# Patient Record
Sex: Female | Born: 1995 | Race: White | Hispanic: No | Marital: Single | Smoking: Former smoker
Health system: Southern US, Community
[De-identification: ages and names within clinical notes are randomized; demographics above are authoritative.]

## PROBLEM LIST (undated history)

## (undated) DIAGNOSIS — Z8709 Personal history of other diseases of the respiratory system: Secondary | ICD-10-CM

## (undated) DIAGNOSIS — K219 Gastro-esophageal reflux disease without esophagitis: Secondary | ICD-10-CM

## (undated) DIAGNOSIS — D649 Anemia, unspecified: Secondary | ICD-10-CM

## (undated) DIAGNOSIS — K589 Irritable bowel syndrome without diarrhea: Secondary | ICD-10-CM

## (undated) DIAGNOSIS — J45909 Unspecified asthma, uncomplicated: Secondary | ICD-10-CM

## (undated) DIAGNOSIS — E282 Polycystic ovarian syndrome: Secondary | ICD-10-CM

## (undated) DIAGNOSIS — F419 Anxiety disorder, unspecified: Secondary | ICD-10-CM

## (undated) DIAGNOSIS — T8859XA Other complications of anesthesia, initial encounter: Secondary | ICD-10-CM

## (undated) DIAGNOSIS — Z87442 Personal history of urinary calculi: Secondary | ICD-10-CM

## (undated) DIAGNOSIS — T7840XA Allergy, unspecified, initial encounter: Secondary | ICD-10-CM

## (undated) DIAGNOSIS — F329 Major depressive disorder, single episode, unspecified: Secondary | ICD-10-CM

## (undated) DIAGNOSIS — K5090 Crohn's disease, unspecified, without complications: Secondary | ICD-10-CM

## (undated) DIAGNOSIS — Z8489 Family history of other specified conditions: Secondary | ICD-10-CM

## (undated) DIAGNOSIS — F32A Depression, unspecified: Secondary | ICD-10-CM

## (undated) DIAGNOSIS — K649 Unspecified hemorrhoids: Secondary | ICD-10-CM

## (undated) DIAGNOSIS — E119 Type 2 diabetes mellitus without complications: Secondary | ICD-10-CM

## (undated) DIAGNOSIS — G4730 Sleep apnea, unspecified: Secondary | ICD-10-CM

## (undated) DIAGNOSIS — L0591 Pilonidal cyst without abscess: Secondary | ICD-10-CM

## (undated) DIAGNOSIS — Z973 Presence of spectacles and contact lenses: Secondary | ICD-10-CM

## (undated) DIAGNOSIS — N809 Endometriosis, unspecified: Secondary | ICD-10-CM

## (undated) HISTORY — DX: Depression, unspecified: F32.A

## (undated) HISTORY — PX: COLONOSCOPY WITH ESOPHAGOGASTRODUODENOSCOPY (EGD): SHX5779

## (undated) HISTORY — DX: Anxiety disorder, unspecified: F41.9

## (undated) HISTORY — DX: Polycystic ovarian syndrome: E28.2

## (undated) HISTORY — DX: Unspecified asthma, uncomplicated: J45.909

## (undated) HISTORY — DX: Sleep apnea, unspecified: G47.30

## (undated) HISTORY — PX: COLONOSCOPY: SHX174

## (undated) HISTORY — PX: UPPER GASTROINTESTINAL ENDOSCOPY: SHX188

## (undated) HISTORY — DX: Anemia, unspecified: D64.9

## (undated) HISTORY — DX: Allergy, unspecified, initial encounter: T78.40XA

---
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
## 2017-01-15 ENCOUNTER — Other Ambulatory Visit: Payer: Self-pay

## 2017-01-15 ENCOUNTER — Encounter: Payer: Self-pay | Admitting: *Deleted

## 2017-01-15 ENCOUNTER — Emergency Department
Admission: EM | Admit: 2017-01-15 | Discharge: 2017-01-15 | Disposition: A | Discharge status: Home / Self Care | Payer: Self-pay | Source: Home / Self Care | Attending: Family Medicine | Admitting: Family Medicine

## 2017-01-15 DIAGNOSIS — L0591 Pilonidal cyst without abscess: Secondary | ICD-10-CM

## 2017-01-15 HISTORY — DX: Gastro-esophageal reflux disease without esophagitis: K21.9

## 2017-01-15 HISTORY — DX: Irritable bowel syndrome, unspecified: K58.9

## 2017-01-15 MED ORDER — CLINDAMYCIN HCL 300 MG PO CAPS: ORAL_CAPSULE | ORAL | 0 refills | Status: DC

## 2017-01-15 NOTE — ED Provider Notes (Signed)
[name]    CSN: [id number] Arrival date & time: 01/15/17  0856     History   Chief Complaint Chief Complaint  Patient presents with  . Abscess    HPI Marissa Morales is a 21 y.o. female.   Patient complains of approximately one year history of painful area between her buttocks when she sits.  The area improved for several months initially, and then became painful again about 3 months ago.  She denies swelling, redness, drainage, or fever.  She has been applying warm compresses and an OTC hemorrhoid cream containing lidocaine.   The history is provided by the patient.  Abscess  Abscess location: between buttocks. Abscess quality: painful   Abscess quality: not draining, no fluctuance, no induration, no itching, no redness, no warmth and not weeping   Red streaking: no   Duration:  3 months Progression:  Unchanged Pain details:    Quality:  Aching   Severity:  Mild   Duration:  3 months   Timing:  Constant   Progression:  Unchanged Chronicity:  Chronic Context: not skin injury   Relieved by: topical OTC cream. Exacerbated by: sitting. Ineffective treatments:  Warm compresses Associated symptoms: no anorexia, no fatigue, no fever and no nausea   Risk factors: no prior abscess     Past Medical History:  Diagnosis Date  . GERD (gastroesophageal reflux disease)   . Hypoglycemia   . IBS (irritable bowel syndrome)     There are no active problems to display for this patient.   Past Surgical History:  Procedure Laterality Date  . COLONOSCOPY    . UPPER GASTROINTESTINAL ENDOSCOPY    . WISDOM TOOTH EXTRACTION      OB History    No data available       Home Medications    Prior to Admission medications   Medication Sig Start Date End Date Taking? Authorizing Provider  Multiple Vitamin (MULTIVITAMIN) tablet Take 1 tablet by mouth daily.   Yes [provider]  clindamycin (CLEOCIN) 300 MG capsule Take one cap every 8 hours for one week  01/15/17   Kandra Nicolas, MD    Family History Family History  Problem Relation Age of Onset  . Irritable bowel syndrome Mother   . Endometriosis Mother   . Colon cancer Mother   . Hypertension Father   . Endometriosis Sister   . Irritable bowel syndrome Sister   . Crohn's disease Sister   . Breast cancer Paternal Grandfather   . Heart attack Maternal Grandmother     Social History Social History   Tobacco Use  . Smoking status: Current Every Day Smoker    Packs/day: 0.50    Types: Cigarettes  . Smokeless tobacco: Never Used  Substance Use Topics  . Alcohol use: Yes    Comment: 1 q wk  . Drug use: No     Allergies   Bactrim [sulfamethoxazole-trimethoprim]; Doxycycline; and Vantin [cefpodoxime]   Review of Systems Review of Systems  Constitutional: Negative for fatigue and fever.  Gastrointestinal: Negative for anorexia and nausea.  All other systems reviewed and are negative.    Physical Exam Triage Vital Signs ED Triage Vitals  Enc Vitals Group     BP 01/15/17 0917 132/88     Pulse Rate 01/15/17 0917 84     Resp 01/15/17 0917 16     Temp 01/15/17 0917 (!) 97.5 F (36.4 C)     Temp Source 01/15/17 0917 Oral  SpO2 01/15/17 0917 99 %     Weight 01/15/17 0918 121 lb (54.9 kg)     Height 01/15/17 0918 5' (1.524 m)     Head Circumference --      Peak Flow --      Pain Score 01/15/17 0918 4     Pain Loc --      Pain Edu? --      Excl. in [place]? --    No data found.  Updated Vital Signs BP 132/88 (BP Location: Left Arm)   Pulse 84   Temp (!) 97.5 F (36.4 C) (Oral)   Resp 16   Ht 5' (1.524 m)   Wt 121 lb (54.9 kg)   LMP 01/14/2017   SpO2 99%   BMI 23.63 kg/m   Visual Acuity Right Eye Distance:   Left Eye Distance:   Bilateral Distance:    Right Eye Near:   Left Eye Near:    Bilateral Near:     Physical Exam  Constitutional: She appears well-developed and well-nourished. No distress.  HENT:  Head: Normocephalic.  Mouth/Throat:  Oropharynx is clear and moist.  Eyes: Pupils are equal, round, and reactive to light.  Cardiovascular: Normal rate.  Pulmonary/Chest: Effort normal.  Neurological: She is alert.  Skin: Skin is warm and dry.     There is tenderness to deep palpation at superior aspect of intergluteal cleft, but no swelling, induration, fluctuance, erythema, or other skin changes.    Nursing note and vitals reviewed.    UC Treatments / Results  Labs (all labs ordered are listed, but only abnormal results are displayed) Labs Reviewed - No data to display  EKG  EKG Interpretation None       Radiology No results found.  Procedures Procedures (including critical care time)  Medications Ordered in UC Medications - No data to display   Initial Impression / Assessment and Plan / UC Course  I have reviewed the triage vital signs and the nursing notes.  Pertinent labs & imaging results that were available during my care of the patient were reviewed by me and considered in my medical decision making (see chart for details).    No evidence abscess. Begin clindamycin 300mg  TID. Apply heating pad, or sit in warm bath tub once or twice daily. Return if develops swelling, increased pain, redness, etc.    Final Clinical Impressions(s) / UC Diagnoses   Final diagnoses:  Pilonidal cyst without abscess    ED Discharge Orders        Ordered    clindamycin (CLEOCIN) 300 MG capsule     01/15/17 0934           Kandra Nicolas, MD 01/15/17 [phone]

## 2017-01-15 NOTE — Discharge Instructions (Signed)
Apply heating pad, or sit in warm bath tub once or twice daily.

## 2017-01-15 NOTE — ED Triage Notes (Signed)
Pt c/o abscess in the middle of her buttocks x 1 year, worsening pain x 2-3 mths.

## 2017-06-03 ENCOUNTER — Emergency Department (INDEPENDENT_AMBULATORY_CARE_PROVIDER_SITE_OTHER)
Admission: EM | Admit: 2017-06-03 | Discharge: 2017-06-03 | Disposition: A | Discharge status: Home / Self Care | Payer: BLUE CROSS/BLUE SHIELD | Source: Home / Self Care

## 2017-06-03 ENCOUNTER — Encounter: Payer: Self-pay | Admitting: Emergency Medicine

## 2017-06-03 ENCOUNTER — Other Ambulatory Visit: Payer: Self-pay

## 2017-06-03 DIAGNOSIS — L0591 Pilonidal cyst without abscess: Secondary | ICD-10-CM

## 2017-06-03 MED ORDER — CLINDAMYCIN HCL 300 MG PO CAPS: 300.0000 mg | ORAL_CAPSULE | Freq: Four times a day (QID) | ORAL | 0 refills | Status: DC

## 2017-06-03 NOTE — ED Triage Notes (Signed)
Patient reports a cyst at bottom of tailbone that was evaluated one year ago; cleared with antibiotics; has returned and is tender/painful.

## 2017-06-03 NOTE — Discharge Instructions (Signed)
Take clindamycin 300 mg 1 pill 3 times daily.  (Caution: If you develop significant diarrhea please discontinue and speak to the doctor)  This would be best taken care of by surgical excision of the entire lesion.  Recommendation for general surgery will be made.  Return if necessary if problems arise before seeing surgeon.

## 2017-06-03 NOTE — ED Provider Notes (Signed)
[name]    CSN: [id number] Arrival date & time: 06/03/17  1524     History   Chief Complaint No chief complaint on file. Patient is had a pilonidal cyst problem for the last 2 years.  It hurts a lot.  He has been hurting more recent weeks.  Last year when it did this she took a course of antibiotics and things subsided.  She says she cannot take sulfa or doxycycline.  The lesion is not been draining.  Last year she said it felt like it went down in size.  Marissa Morales Marissa Morales is a 22 y.o. female.   Marissa Morales  Past Medical History:  Diagnosis Date  . GERD (gastroesophageal reflux disease)   . Hypoglycemia   . IBS (irritable bowel syndrome)     There are no active problems to display for this patient.   Past Surgical History:  Procedure Laterality Date  . COLONOSCOPY    . UPPER GASTROINTESTINAL ENDOSCOPY    . WISDOM TOOTH EXTRACTION      OB History   None      Home Medications    Prior to Admission medications   Medication Sig Start Date End Date Taking? Authorizing Provider  clindamycin (CLEOCIN) 300 MG capsule Take one cap every 8 hours for one week 01/15/17   Kandra Nicolas, MD  Multiple Vitamin (MULTIVITAMIN) tablet Take 1 tablet by mouth daily.    [provider]    Family History Family History  Problem Relation Age of Onset  . Irritable bowel syndrome Mother   . Endometriosis Mother   . Colon cancer Mother   . Hypertension Father   . Endometriosis Sister   . Irritable bowel syndrome Sister   . Crohn's disease Sister   . Breast cancer Paternal Grandfather   . Heart attack Maternal Grandmother     Social History Social History   Tobacco Use  . Smoking status: Current Every Day Smoker    Packs/day: 0.50    Types: Cigarettes  . Smokeless tobacco: Never Used  Substance Use Topics  . Alcohol use: Yes    Comment: 1 q wk  . Drug use: No     Allergies   Bactrim [sulfamethoxazole-trimethoprim]; Doxycycline; and Vantin  [cefpodoxime]   Review of Systems Review of Systems No other complaints other than the pain at her tailbone area.  Physical Exam Triage Vital Signs ED Triage Vitals  Enc Vitals Group     BP      Pulse      Resp      Temp      Temp src      SpO2      Weight      Height      Head Circumference      Peak Flow      Pain Score      Pain Loc      Pain Edu?      Excl. in [place]?    No data found.  Updated Vital Signs There were no vitals taken for this visit.  Visual Acuity Right Eye Distance:   Left Eye Distance:   Bilateral Distance:    Right Eye Near:   Left Eye Near:    Bilateral Near:     Physical Exam She has a small dimple just to the left of the pilonidal area.  Just below that, in the center of the crack, there is a tiny bit of erythema.  No major fluctuance or  cyst formation.  It is significantly tender to touch.  UC Treatments / Results  Labs (all labs ordered are listed, but only abnormal results are displayed) Labs Reviewed - No data to display  EKG None Radiology No results found.  Procedures Procedures (including critical care time)  Medications Ordered in UC Medications - No data to display   Initial Impression / Assessment and Plan / UC Course  I have reviewed the triage vital signs and the nursing notes.  Pertinent labs & imaging results that were available during my care of the patient were reviewed by me and considered in my medical decision making (see chart for details).     Pilonidal sinus with mild cyst inflammation.  This does not appear to need draining today.  However I think the best answer for her would be to have block excision of the sinus tract.  She is very anxious, and this would best be done in day surgery somewhere.  Final Clinical Impressions(s) / UC Diagnoses   Final diagnoses:  None    ED Discharge Orders    None    Take clindamycin 300 mg 1 pill 3 times daily.  (Caution: If you develop significant diarrhea  please discontinue and speak to the doctor)  This would be best taken care of by surgical excision of the entire lesion.  Recommendation for general surgery will be made.  Return if necessary if problems arise before seeing surgeon.   Controlled Substance Prescriptions [place] Controlled Substance Registry consulted? No   Posey Boyer, MD 06/03/17 [phone]

## 2017-06-05 ENCOUNTER — Ambulatory Visit: Payer: Self-pay | Admitting: Surgery

## 2017-06-05 NOTE — H&P (Signed)
Marissa Morales Documented: 06/05/2017 9:43 AM Location: [facility] Patient #: [id number] DOB: 1995-05-25 Single / Language: Marissa Morales / Race: White Female  History of Present Illness (Marissa Morales A. Kae Heller MD; 06/05/2017 9:56 AM) Patient words: 22 year old woman with history of GERD, irritable bowel syndrome is referred for a two-year history of a pilonidal sinus. Resulted to the emergency room a couple days ago with pain for the preceding couple of weeks. This first occurred about a year ago and she was treated with antibiotics with some improvement. She describes essentially constant discomfort in the area aggravated by sitting. No draining or prior procedures in the area. She was put on clindamycin by Dr. Linna Morales in the ER 2 days ago.  The patient is a 22 year old female.   Past Surgical History Marissa Lorenzo, LPN; 08/17/4763 4:65 AM) Oral Surgery  Diagnostic Studies History Marissa Lorenzo, LPN; 0/35/4656 8:12 AM) Colonoscopy 1-5 years ago Pap Smear 1-5 years ago  Allergies Marissa Lorenzo, LPN; 7/51/7001 7:49 AM) Marissa Morales *CEPHALOSPORINS* unknown Doxycycline Hyclate *TETRACYCLINES* Pepto Bismol *ANTIDIARRHEAL/PROBIOTIC AGENTS* Bactrim *ANTI-INFECTIVE AGENTS - MISC.* Rash.  Medication History Marissa Lorenzo, LPN; 4/49/6759 1:63 AM) Clindamycin HCl (300MG Capsule, Oral) Active. Medications Reconciled  Social History Marissa Lorenzo, LPN; 8/46/6599 3:57 AM) Alcohol use Occasional alcohol use. Caffeine use Carbonated beverages, Coffee, Tea. No drug use Tobacco use Former smoker.  Family History Marissa Lorenzo, LPN; 0/17/7939 0:30 AM) Alcohol Abuse Father, Mother. Anesthetic complications Mother. Arthritis Mother. Cerebrovascular Accident Father. Colon Polyps Father, Mother. Depression Father, Mother, Sister. Hypertension Father. Ischemic Bowel Disease Father, Mother, Sister. Respiratory Condition Mother.  Pregnancy / Birth History Marissa Lorenzo, LPN; 0/92/3300 7:62 AM) Age at menarche 45 years. Contraceptive History Oral contraceptives. Gravida 1 Irregular periods Maternal age 68-20 Para 0  Other Problems Marissa Lorenzo, LPN; 2/63/3354 5:62 AM) Anxiety Disorder Asthma Depression Gastric Ulcer Gastroesophageal Reflux Disease Hemorrhoids     Review of Systems Marissa Billings Dockery LPN; 5/63/8937 3:42 AM) General Not Present- Appetite Loss, Chills, Fatigue, Fever, Night Sweats, Weight Gain and Weight Loss. Skin Present- New Lesions and Non-Healing Wounds. Not Present- Change in Wart/Mole, Dryness, Hives, Jaundice, Rash and Ulcer. HEENT Not Present- Earache, Hearing Loss, Hoarseness, Nose Bleed, Oral Ulcers, Ringing in the Ears, Seasonal Allergies, Sinus Pain, Sore Throat, Visual Disturbances, Wears glasses/contact lenses and Yellow Eyes. Respiratory Not Present- Bloody sputum, Chronic Cough, Difficulty Breathing, Snoring and Wheezing. Breast Not Present- Breast Mass, Breast Pain, Nipple Discharge and Skin Changes. Cardiovascular Not Present- Chest Pain, Difficulty Breathing Lying Down, Leg Cramps, Palpitations, Rapid Heart Rate, Shortness of Breath and Swelling of Extremities. Gastrointestinal Not Present- Abdominal Pain, Bloating, Bloody Stool, Change in Bowel Habits, Chronic diarrhea, Constipation, Difficulty Swallowing, Excessive gas, Gets full quickly at meals, Hemorrhoids, Indigestion, Nausea, Rectal Pain and Vomiting. Female Genitourinary Not Present- Frequency, Nocturia, Painful Urination, Pelvic Pain and Urgency. Musculoskeletal Not Present- Back Pain, Joint Pain, Joint Stiffness, Muscle Pain, Muscle Weakness and Swelling of Extremities. Neurological Not Present- Decreased Memory, Fainting, Headaches, Numbness, Seizures, Tingling, Tremor, Trouble walking and Weakness. Psychiatric Not Present- Anxiety, Bipolar, Change in Sleep Pattern, Depression, Fearful and Frequent crying. Endocrine Not Present- Cold  Intolerance, Excessive Hunger, Hair Changes, Heat Intolerance, Hot flashes and New Diabetes. Hematology Not Present- Blood Thinners, Easy Bruising, Excessive bleeding, Gland problems, HIV and Persistent Infections.  Vitals Marissa Billings Dockery LPN; 8/76/8115 7:26 AM) 06/05/2017 9:44 AM Weight: 129.4 lb Height: 60.5in Body Surface Area: 1.56 m Body Mass Index: 24.86 kg/m  Temp.: 97.80F(Temporal)  Pulse: 61 (Regular)  BP: 118/64 (Sitting,  Left Arm, Standard)      Physical Exam (Marissa Morales A. Kae Heller MD; 06/05/2017 9:58 AM)  The physical exam findings are as follows: Note:Gen: alert and well appearing Eye: extraocular motion intact, no scleral icterus ENT: moist mucus membranes, dentition intact Neck: no mass or thyromegaly Chest: unlabored respirations, symmetrical air entry CV: regular rate and rhythm, no pedal edema Abdomen: soft, nontender, nondistended. MSK: strength symmetrical throughout, no deformity Neuro: grossly intact, normal gait Psych: normal mood and anxious affect, appropriate insight Skin: warm and dry. Just to the right of midline at the superior aspect of the natal cleft there is a less than 1 cm area of pink/purple discolored skin without surrounding erythema or induration, although there is no palpable large cyst or abscess this area is extremely tender. In the midline just inferior to this there is a very subtle pit consistent with a pilonidal sinus.    Assessment & Plan (Marissa Morales A. 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 We discussed options of ongoing observation versus excision and she very much wants to pursue excision. I discussed that this would entail an open wound that would have to heal from the inside out, and that there is a significant risk of chronic wound and difficulty healing in this area. Discussed very small risk of recurrent disease given her absence of hair in the area. She  expressed understanding. her questions were answered to her satisfaction and she desires to proceed.

## 2017-07-15 ENCOUNTER — Encounter (HOSPITAL_BASED_OUTPATIENT_CLINIC_OR_DEPARTMENT_OTHER): Payer: Self-pay | Admitting: *Deleted

## 2017-07-15 ENCOUNTER — Other Ambulatory Visit: Payer: Self-pay

## 2017-07-15 NOTE — Progress Notes (Signed)
SPOKE W/ PT VIA PHONE FOR PRE-OP INTERVIEW.  NPO AFTER MN.  ARRIVE AT 0530.  NEEDS URINE PREG.  WILL DO HIBICLENS SHOWER HS BEFORE AND AM DOS.

## 2017-07-17 NOTE — Anesthesia Preprocedure Evaluation (Addendum)
Anesthesia Evaluation  Patient identified by MRN, date of birth, ID band Patient awake    Reviewed: Allergy & Precautions, NPO status , Patient's Chart, lab work & pertinent test results  Airway Mallampati: II  TM Distance: >3 FB     Dental   Pulmonary former smoker,    breath sounds clear to auscultation       Cardiovascular negative cardio ROS   Rhythm:Regular Rate:Normal     Neuro/Psych    GI/Hepatic Neg liver ROS, GERD  ,  Endo/Other  negative endocrine ROS  Renal/GU negative Renal ROS     Musculoskeletal   Abdominal   Peds  Hematology   Anesthesia Other Findings   Reproductive/Obstetrics                            Anesthesia Physical Anesthesia Plan  ASA: III  Anesthesia Plan: MAC   Post-op Pain Management:    Induction: Intravenous  PONV Risk Score and Plan: Treatment may vary due to age or medical condition, Ondansetron, Dexamethasone and Midazolam  Airway Management Planned: Simple Face Mask  Additional Equipment:   Intra-op Plan:   Post-operative Plan:   Informed Consent: I have reviewed the patients History and Physical, chart, labs and discussed the procedure including the risks, benefits and alternatives for the proposed anesthesia with the patient or authorized representative who has indicated his/her understanding and acceptance.   Dental advisory given  Plan Discussed with: Anesthesiologist and CRNA  Anesthesia Plan Comments:       Anesthesia Quick Evaluation

## 2017-07-18 ENCOUNTER — Ambulatory Visit (HOSPITAL_BASED_OUTPATIENT_CLINIC_OR_DEPARTMENT_OTHER): Payer: BLUE CROSS/BLUE SHIELD | Admitting: Anesthesiology

## 2017-07-18 ENCOUNTER — Ambulatory Visit (HOSPITAL_BASED_OUTPATIENT_CLINIC_OR_DEPARTMENT_OTHER)
Admission: RE | Admit: 2017-07-18 | Discharge: 2017-07-18 | Disposition: A | Discharge status: Home / Self Care | Payer: BLUE CROSS/BLUE SHIELD | Source: Ambulatory Visit | Attending: Surgery | Admitting: Surgery

## 2017-07-18 ENCOUNTER — Encounter (HOSPITAL_BASED_OUTPATIENT_CLINIC_OR_DEPARTMENT_OTHER)
Admission: RE | Disposition: A | Discharge status: Home / Self Care | Payer: Self-pay | Source: Ambulatory Visit | Attending: Surgery

## 2017-07-18 ENCOUNTER — Encounter (HOSPITAL_BASED_OUTPATIENT_CLINIC_OR_DEPARTMENT_OTHER): Payer: Self-pay

## 2017-07-18 DIAGNOSIS — F329 Major depressive disorder, single episode, unspecified: Secondary | ICD-10-CM | POA: Insufficient documentation

## 2017-07-18 DIAGNOSIS — L0591 Pilonidal cyst without abscess: Secondary | ICD-10-CM | POA: Diagnosis present

## 2017-07-18 DIAGNOSIS — K219 Gastro-esophageal reflux disease without esophagitis: Secondary | ICD-10-CM | POA: Diagnosis not present

## 2017-07-18 DIAGNOSIS — F419 Anxiety disorder, unspecified: Secondary | ICD-10-CM | POA: Diagnosis not present

## 2017-07-18 DIAGNOSIS — Z87891 Personal history of nicotine dependence: Secondary | ICD-10-CM | POA: Insufficient documentation

## 2017-07-18 DIAGNOSIS — Z79899 Other long term (current) drug therapy: Secondary | ICD-10-CM | POA: Diagnosis not present

## 2017-07-18 DIAGNOSIS — J45909 Unspecified asthma, uncomplicated: Secondary | ICD-10-CM | POA: Insufficient documentation

## 2017-07-18 HISTORY — DX: Unspecified hemorrhoids: K64.9

## 2017-07-18 HISTORY — DX: Personal history of other diseases of the respiratory system: Z87.09

## 2017-07-18 HISTORY — DX: Presence of spectacles and contact lenses: Z97.3

## 2017-07-18 HISTORY — DX: Pilonidal cyst without abscess: L05.91

## 2017-07-18 HISTORY — PX: PILONIDAL CYST EXCISION: SHX744

## 2017-07-18 LAB — POCT PREGNANCY, URINE: Preg Test, Ur: NEGATIVE

## 2017-07-18 SURGERY — EXCISION, SIMPLE PILONIDAL CYST
Anesthesia: Monitor Anesthesia Care

## 2017-07-18 MED ORDER — FENTANYL CITRATE (PF) 100 MCG/2ML IJ SOLN
INTRAMUSCULAR | Status: AC
  Filled 2017-07-18: qty 2

## 2017-07-18 MED ORDER — BUPIVACAINE-EPINEPHRINE 0.25% -1:200000 IJ SOLN
INTRAMUSCULAR | Status: DC | PRN
  Administered 2017-07-18: 15 mL

## 2017-07-18 MED ORDER — ONDANSETRON HCL 4 MG/2ML IJ SOLN
INTRAMUSCULAR | Status: AC
  Filled 2017-07-18: qty 2

## 2017-07-18 MED ORDER — PROPOFOL 10 MG/ML IV BOLUS
INTRAVENOUS | Status: DC | PRN
  Administered 2017-07-18 (×3): 20 mg via INTRAVENOUS

## 2017-07-18 MED ORDER — HYDROCODONE-ACETAMINOPHEN 5-325 MG PO TABS: 1.0000 | ORAL_TABLET | Freq: Four times a day (QID) | ORAL | 0 refills | Status: DC | PRN

## 2017-07-18 MED ORDER — GABAPENTIN 300 MG PO CAPS
300.0000 mg | ORAL_CAPSULE | ORAL | Status: AC
  Administered 2017-07-18: 300 mg via ORAL
  Filled 2017-07-18: qty 1

## 2017-07-18 MED ORDER — SODIUM CHLORIDE 0.9% FLUSH
3.0000 mL | INTRAVENOUS | Status: DC | PRN
  Filled 2017-07-18: qty 3

## 2017-07-18 MED ORDER — MIDAZOLAM HCL 5 MG/5ML IJ SOLN
INTRAMUSCULAR | Status: DC | PRN
  Administered 2017-07-18: 2 mg via INTRAVENOUS

## 2017-07-18 MED ORDER — FENTANYL CITRATE (PF) 100 MCG/2ML IJ SOLN
25.0000 ug | INTRAMUSCULAR | Status: DC | PRN
  Filled 2017-07-18: qty 1

## 2017-07-18 MED ORDER — PROPOFOL 500 MG/50ML IV EMUL
INTRAVENOUS | Status: DC | PRN
  Administered 2017-07-18: 75 ug/kg/min via INTRAVENOUS

## 2017-07-18 MED ORDER — VANCOMYCIN HCL IN DEXTROSE 1-5 GM/200ML-% IV SOLN
INTRAVENOUS | Status: AC
  Filled 2017-07-18: qty 200

## 2017-07-18 MED ORDER — DIPHENHYDRAMINE HCL 50 MG/ML IJ SOLN
INTRAMUSCULAR | Status: AC
  Filled 2017-07-18: qty 1

## 2017-07-18 MED ORDER — LACTATED RINGERS IV SOLN
INTRAVENOUS | Status: DC
  Administered 2017-07-18: 06:00:00 via INTRAVENOUS
  Filled 2017-07-18: qty 1000

## 2017-07-18 MED ORDER — CHLORHEXIDINE GLUCONATE 4 % EX LIQD
60.0000 mL | Freq: Once | CUTANEOUS | Status: DC
  Filled 2017-07-18: qty 118

## 2017-07-18 MED ORDER — ACETAMINOPHEN 325 MG PO TABS
650.0000 mg | ORAL_TABLET | ORAL | Status: DC | PRN
  Filled 2017-07-18: qty 2

## 2017-07-18 MED ORDER — ACETAMINOPHEN 650 MG RE SUPP
650.0000 mg | RECTAL | Status: DC | PRN
  Filled 2017-07-18: qty 1

## 2017-07-18 MED ORDER — GABAPENTIN 300 MG PO CAPS
ORAL_CAPSULE | ORAL | Status: AC
  Filled 2017-07-18: qty 1

## 2017-07-18 MED ORDER — SODIUM CHLORIDE 0.9 % IV SOLN
250.0000 mL | INTRAVENOUS | Status: DC | PRN
  Filled 2017-07-18: qty 250

## 2017-07-18 MED ORDER — DOCUSATE SODIUM 100 MG PO CAPS: 100.0000 mg | ORAL_CAPSULE | Freq: Two times a day (BID) | ORAL | 0 refills | Status: AC

## 2017-07-18 MED ORDER — DIPHENHYDRAMINE HCL 50 MG/ML IJ SOLN
INTRAMUSCULAR | Status: DC | PRN
  Administered 2017-07-18: 6.25 mg via INTRAVENOUS

## 2017-07-18 MED ORDER — ONDANSETRON HCL 4 MG/2ML IJ SOLN
INTRAMUSCULAR | Status: DC | PRN
  Administered 2017-07-18: 4 mg via INTRAVENOUS

## 2017-07-18 MED ORDER — KETAMINE HCL 10 MG/ML IJ SOLN
INTRAMUSCULAR | Status: AC
  Filled 2017-07-18: qty 1

## 2017-07-18 MED ORDER — PROPOFOL 500 MG/50ML IV EMUL
INTRAVENOUS | Status: AC
  Filled 2017-07-18: qty 50

## 2017-07-18 MED ORDER — MIDAZOLAM HCL 2 MG/2ML IJ SOLN
INTRAMUSCULAR | Status: AC
  Filled 2017-07-18: qty 2

## 2017-07-18 MED ORDER — DEXAMETHASONE SODIUM PHOSPHATE 10 MG/ML IJ SOLN
INTRAMUSCULAR | Status: AC
  Filled 2017-07-18: qty 1

## 2017-07-18 MED ORDER — DIPHENHYDRAMINE HCL 50 MG/ML IJ SOLN
6.2500 mg | Freq: Once | INTRAMUSCULAR | Status: AC
  Administered 2017-07-18: 6.25 mg via INTRAVENOUS
  Filled 2017-07-18: qty 0.13

## 2017-07-18 MED ORDER — SODIUM CHLORIDE 0.9% FLUSH
3.0000 mL | Freq: Two times a day (BID) | INTRAVENOUS | Status: DC
  Filled 2017-07-18: qty 3

## 2017-07-18 MED ORDER — FENTANYL CITRATE (PF) 100 MCG/2ML IJ SOLN
INTRAMUSCULAR | Status: DC | PRN
  Administered 2017-07-18: 50 ug via INTRAVENOUS

## 2017-07-18 MED ORDER — VANCOMYCIN HCL IN DEXTROSE 1-5 GM/200ML-% IV SOLN
1000.0000 mg | INTRAVENOUS | Status: AC
  Administered 2017-07-18: 1000 mg via INTRAVENOUS
  Filled 2017-07-18: qty 200

## 2017-07-18 MED ORDER — DEXAMETHASONE SODIUM PHOSPHATE 10 MG/ML IJ SOLN
INTRAMUSCULAR | Status: DC | PRN
  Administered 2017-07-18: 10 mg via INTRAVENOUS

## 2017-07-18 MED ORDER — ACETAMINOPHEN 500 MG PO TABS
1000.0000 mg | ORAL_TABLET | ORAL | Status: AC
  Administered 2017-07-18: 1000 mg via ORAL
  Filled 2017-07-18: qty 2

## 2017-07-18 MED ORDER — PROPOFOL 10 MG/ML IV BOLUS
INTRAVENOUS | Status: AC
  Filled 2017-07-18: qty 20

## 2017-07-18 MED ORDER — BUPIVACAINE LIPOSOME 1.3 % IJ SUSP
INTRAMUSCULAR | Status: DC | PRN
  Administered 2017-07-18: 15 mL

## 2017-07-18 MED ORDER — ACETAMINOPHEN 500 MG PO TABS
ORAL_TABLET | ORAL | Status: AC
  Filled 2017-07-18: qty 2

## 2017-07-18 MED ORDER — KETAMINE HCL 10 MG/ML IJ SOLN
INTRAMUSCULAR | Status: DC | PRN
  Administered 2017-07-18: 10 mg via INTRAVENOUS
  Administered 2017-07-18: 20 mg via INTRAVENOUS

## 2017-07-18 MED ORDER — OXYCODONE HCL 5 MG PO TABS
5.0000 mg | ORAL_TABLET | ORAL | Status: DC | PRN
  Filled 2017-07-18: qty 2

## 2017-07-18 SURGICAL SUPPLY — 47 items
BENZOIN TINCTURE PRP APPL 2/3 (GAUZE/BANDAGES/DRESSINGS) ×2 IMPLANT
BLADE EXTENDED COATED 6.5IN (ELECTRODE) IMPLANT
BLADE HEX COATED 2.75 (ELECTRODE) ×2 IMPLANT
BLADE SURG 10 STRL SS (BLADE) IMPLANT
BLADE SURG 15 STRL LF DISP TIS (BLADE) ×1 IMPLANT
BLADE SURG 15 STRL SS (BLADE) ×1
BRIEF STRETCH FOR OB PAD LRG (UNDERPADS AND DIAPERS) IMPLANT
CANISTER SUCT 3000ML PPV (MISCELLANEOUS) ×2 IMPLANT
COVER BACK TABLE 60X90IN (DRAPES) ×2 IMPLANT
COVER MAYO STAND STRL (DRAPES) ×2 IMPLANT
DRAIN PENROSE 18X1/4 LTX STRL (WOUND CARE) IMPLANT
DRAPE LAPAROTOMY 100X72 PEDS (DRAPES) ×2 IMPLANT
DRAPE UTILITY XL STRL (DRAPES) ×2 IMPLANT
ELECT BLADE TIP CTD 4 INCH (ELECTRODE) IMPLANT
ELECT NEEDLE BLADE 2-5/6 (NEEDLE) ×2 IMPLANT
ELECT REM PT RETURN 9FT ADLT (ELECTROSURGICAL) ×2
ELECTRODE REM PT RTRN 9FT ADLT (ELECTROSURGICAL) ×1 IMPLANT
GAUZE SPONGE 4X4 12PLY STRL (GAUZE/BANDAGES/DRESSINGS) ×2 IMPLANT
GAUZE SPONGE 4X4 16PLY XRAY LF (GAUZE/BANDAGES/DRESSINGS) IMPLANT
GAUZE SPONGE 4X4 8PLY STR LF (GAUZE/BANDAGES/DRESSINGS) ×2 IMPLANT
GAUZE VASELINE 3X9 (GAUZE/BANDAGES/DRESSINGS) IMPLANT
GLOVE BIO SURGEON STRL SZ 6 (GLOVE) ×2 IMPLANT
GLOVE BIOGEL PI IND STRL 6.5 (GLOVE) ×1 IMPLANT
GLOVE BIOGEL PI INDICATOR 6.5 (GLOVE) ×1
GOWN STRL REUS W/ TWL LRG LVL3 (GOWN DISPOSABLE) ×1 IMPLANT
GOWN STRL REUS W/TWL LRG LVL3 (GOWN DISPOSABLE) ×1
KIT TURNOVER CYSTO (KITS) ×2 IMPLANT
NDL SAFETY ECLIPSE 18X1.5 (NEEDLE) IMPLANT
NEEDLE HYPO 18GX1.5 SHARP (NEEDLE)
NEEDLE HYPO 25X1 1.5 SAFETY (NEEDLE) ×2 IMPLANT
NS IRRIG 500ML POUR BTL (IV SOLUTION) ×2 IMPLANT
PACK BASIN DAY SURGERY FS (CUSTOM PROCEDURE TRAY) ×2 IMPLANT
PAD ABD 8X10 STRL (GAUZE/BANDAGES/DRESSINGS) ×2 IMPLANT
PAD ARMBOARD 7.5X6 YLW CONV (MISCELLANEOUS) ×2 IMPLANT
PENCIL BUTTON HOLSTER BLD 10FT (ELECTRODE) ×2 IMPLANT
SPONGE GAUZE 2X2 8PLY STRL LF (GAUZE/BANDAGES/DRESSINGS) ×2 IMPLANT
SPONGE SURGIFOAM ABS GEL 12-7 (HEMOSTASIS) IMPLANT
SUCTION FRAZIER TIP 10 FR DISP (SUCTIONS) ×2 IMPLANT
SUT CHROMIC 3 0 PS 2 (SUTURE) IMPLANT
SUT CHROMIC 3 0 SH 27 (SUTURE) IMPLANT
SUT VIC AB 2-0 SH 27 (SUTURE)
SUT VIC AB 2-0 SH 27X BRD (SUTURE) IMPLANT
SYR CONTROL 10ML LL (SYRINGE) ×2 IMPLANT
TOWEL OR 17X24 6PK STRL BLUE (TOWEL DISPOSABLE) ×4 IMPLANT
TRAY DSU PREP LF (CUSTOM PROCEDURE TRAY) ×2 IMPLANT
TUBE CONNECTING 12X1/4 (SUCTIONS) ×2 IMPLANT
YANKAUER SUCT BULB TIP NO VENT (SUCTIONS) ×2 IMPLANT

## 2017-07-18 NOTE — Discharge Instructions (Signed)
°  Postoperative Instructions Surgery for Pilonidal Disease Restrictions  You may shower after 24 hours but you should avoid soaking in water for more than ten minutes.  There are no dietary restrictions.  You should avoid alcohol while you are on narcotic pain medication.  Activity can be as tolerated.  Wound care - Your incision was intentionally left open. You should pack the incision with gauze, moistened with saline, covered with a dry dressing and secured with tape. This should be done at least daily or whenever the dressing gets wet. The wound will heal over 6-12 weeks.  - Hair removal using clippers, waxing or depilatory creams around the area will help reduce the chance of recurrence. This can be initiated once the wound starts to heal.    Medications  You will be given a prescription for pain medicine when you are discharged from the hospital. In addition to the prescription pain medicine that you were given, you may take Motrin, Advil, or ibuprofen at the same time. This often gives better pain relief than either one by itself.  Stop the prescription and switch to Motrin, Advil, or ibuprofen as soon as these medications can control your pain. (This will reduce your risk of constipation.) -Most patients will experience some swelling and bruising around the incisions.  Ice packs or heating pads (30-60 minutes up to 6 times a day) will help. Use ice for the first few days to help decrease swelling and bruising, then switch to heat to help relax tight/sore spots and speed recovery.  Some people prefer to use ice alone, heat alone, alternating between ice & heat.  Experiment to what works for you.  Swelling and bruising can take several weeks to resolve.  Take Colace 100 mg two times daily until you are seen in the office for your followup visit.  Call the office if:  The pain worsens and you need to increase the amount of pain medication.  You experience persistent fever or chills.  (You may have lowgrade fevers on and off for the days following surgery? this is your bodys normal reaction to surgery.)  There is redness of more than  inch around the incisions.  There is drainage of cloudy fluid or pus (Drainage of a yellowish bloody fluid is normal.)  You experience persistent nausea or vomiting.   Please call the office [phone]) to make a followup appointment for one to two weeks after surgery and if you have any other problems, questions, or concerns.   The clinic staff is available to answer your questions during regular business hours (8:30am-5pm).  Please dont hesitate to call and ask to speak to one of our nurses for clinical concerns.              If you have disability or family leave forms, bring them to the office for processing. Do not give them to your doctor.  If you have a medical emergency, go to the nearest emergency room or call 911.  A surgeon from [facility] is always on call at the [facility], [place], [place], [place], [place]  [address] ? MAIN: [phone] ? TOLL FREE: [phone] ?  FAX [phone] [email or web address]

## 2017-07-18 NOTE — H&P (Signed)
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 Resulted to the emergency room a couple days ago with pain for the preceding couple of weeks. This first occurred about a year ago and she was treated with antibiotics with some improvement. She describes essentially constant discomfort in the area aggravated by sitting. No draining or prior procedures in the area. She was put on clindamycin by Dr. Linna Darner in the ER 2 days ago.   Past Surgical History  Oral Surgery  Diagnostic Studies History Colonoscopy 1-5 years ago Pap Smear 1-5 years ago  Allergies  Vantin *CEPHALOSPORINS* unknown Doxycycline Hyclate *TETRACYCLINES* Pepto Bismol *ANTIDIARRHEAL/PROBIOTIC AGENTS* Bactrim *ANTI-INFECTIVE AGENTS - MISC.* Rash.  Medication History  Clindamycin HCl (300MG Capsule, Oral) Active. Medications Reconciled  Social History Alcohol use Occasional alcohol use. Caffeine use Carbonated beverages, Coffee, Tea. No drug use Tobacco use Former smoker.  Family History Alcohol Abuse Father, Mother. Anesthetic complications Mother. Arthritis Mother. Cerebrovascular Accident Father. Colon Polyps Father, Mother. Depression Father, Mother, Sister. Hypertension Father. Ischemic Bowel Disease Father, Mother, Sister. Respiratory Condition Mother.  Pregnancy / Birth History Age at menarche 15 years. Contraceptive History Oral contraceptives. Gravida 1 Irregular periods Maternal age 76-20 Para 0  Other Problems  Anxiety Disorder Asthma Depression Gastric Ulcer Gastroesophageal Reflux Disease Hemorrhoids     Review of Systems General Not Present- Appetite Loss, Chills, Fatigue, Fever, Night Sweats, Weight Gain and Weight Loss. Skin Present-  New Lesions and Non-Healing Wounds. Not Present- Change in Wart/Mole, Dryness, Hives, Jaundice, Rash and Ulcer. HEENT Not Present- Earache, Hearing Loss, Hoarseness, Nose Bleed, Oral Ulcers, Ringing in the Ears, Seasonal Allergies, Sinus Pain, Sore Throat, Visual Disturbances, Wears glasses/contact lenses and Yellow Eyes. Respiratory Not Present- Bloody sputum, Chronic Cough, Difficulty Breathing, Snoring and Wheezing. Breast Not Present- Breast Mass, Breast Pain, Nipple Discharge and Skin Changes. Cardiovascular Not Present- Chest Pain, Difficulty Breathing Lying Down, Leg Cramps, Palpitations, Rapid Heart Rate, Shortness of Breath and Swelling of Extremities. Gastrointestinal Not Present- Abdominal Pain, Bloating, Bloody Stool, Change in Bowel Habits, Chronic diarrhea, Constipation, Difficulty Swallowing, Excessive gas, Gets full quickly at meals, Hemorrhoids, Indigestion, Nausea, Rectal Pain and Vomiting. Female Genitourinary Not Present- Frequency, Nocturia, Painful Urination, Pelvic Pain and Urgency. Musculoskeletal Not Present- Back Pain, Joint Pain, Joint Stiffness, Muscle Pain, Muscle Weakness and Swelling of Extremities. Neurological Not Present- Decreased Memory, Fainting, Headaches, Numbness, Seizures, Tingling, Tremor, Trouble walking and Weakness. Psychiatric Not Present- Anxiety, Bipolar, Change in Sleep Pattern, Depression, Fearful and Frequent crying. Endocrine Not Present- Cold Intolerance, Excessive Hunger, Hair Changes, Heat Intolerance, Hot flashes and New Diabetes. Hematology Not Present- Blood Thinners, Easy Bruising, Excessive bleeding, Gland problems, HIV and Persistent Infections.  Vitals:   07/18/17 0547  BP: 130/88  Pulse: (!) 130  Resp: 16  Temp: 97.6 F (36.4 C)  SpO2: 99%     Physical Exam   The physical exam findings are as follows: Note:Gen: alert and well appearing Eye: extraocular motion intact, no scleral icterus ENT: moist mucus membranes,  dentition intact Neck: no mass or thyromegaly Chest: unlabored respirations, symmetrical air entry CV: regular rate and rhythm, no pedal edema Abdomen: soft, nontender, nondistended. MSK: strength symmetrical throughout, no deformity Neuro: grossly intact, normal gait Psych: normal mood and anxious affect, appropriate insight Skin: warm and dry. Just to the right of midline at the superior aspect  of the natal cleft there is a less than 1 cm area of pink/purple discolored skin without surrounding erythema or induration, although there is no palpable large cyst or abscess this area is extremely tender. In the midline just inferior to this there is a very subtle pit consistent with a pilonidal sinus.    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 We discussed options of ongoing observation versus excision and she very much wants to pursue excision. I discussed that this would entail an open wound that would have to heal from the inside out, and that there is a significant risk of chronic wound and difficulty healing in this area. Discussed very small risk of recurrent disease given her absence of hair in the area. She expressed understanding. her questions were answered to her satisfaction and she desires to proceed.  This morning in preop she has developed severe scalp pruritis and flushing with initiation of the vancomycin. Will add this to her allergy list.

## 2017-07-18 NOTE — Transfer of Care (Signed)
Immediate Anesthesia Transfer of Care Note  Patient: Brieana Shimmin  Procedure(s) Performed: CYST EXCISION PILONIDAL (N/A )  Patient Location: PACU  Anesthesia Type:MAC  Level of Consciousness: awake, alert  and oriented  Airway & Oxygen Therapy: Patient Spontanous Breathing and Patient connected to face mask oxygen  Post-op Assessment: Report given to RN and Post -op Vital signs reviewed and stable  Post vital signs: Reviewed and stable  Last Vitals:  Vitals Value Taken Time  BP    Temp    Pulse    Resp    SpO2      Last Pain:  Vitals:   07/18/17 0547  TempSrc: Oral  PainSc: 2       Patients Stated Pain Goal: 5 (77/93/96 8864)  Complications: No apparent anesthesia complications

## 2017-07-18 NOTE — Anesthesia Postprocedure Evaluation (Signed)
Anesthesia Post Note  Patient: Marissa Morales  Procedure(s) Performed: CYST EXCISION PILONIDAL (N/A )     Patient location during evaluation: PACU Anesthesia Type: MAC Pain management: pain level controlled Respiratory status: spontaneous breathing Cardiovascular status: stable Anesthetic complications: no    Last Vitals:  Vitals:   07/18/17 0804 07/18/17 0815  BP: 109/76 131/83  Pulse: 76 (!) 110  Resp: 16 (!) 22  Temp: 36.8 C   SpO2: 100% 96%    Last Pain:  Vitals:   07/18/17 0815  TempSrc:   PainSc: 0-No pain                 Graylon Amory

## 2017-07-18 NOTE — Op Note (Signed)
Operative Note  Marissa Morales  [id number]  [id number]  07/18/2017   Surgeon: Vikki Ports A ConnorMD  Assistant: none  Procedure performed: excision pilonidal cyst  Preop diagnosis: pilonidal cyst Post-op diagnosis/intraop findings: same  Specimens: no Retained items: wet to dry packing will be changed by patient at home in 24-48h EBL: minimal cc Complications: none  Description of procedure: After obtaining informed consent the patient was taken to the operating room and placed prone on operating room table [facility] was initiated, preoperative antibiotics were administered, SCDs applied, and a formal timeout was performed. The natal cleft and surrounding area was prepped and draped in the usual sterile fashion. The skin and subcutaneous tissue surrounding the cyst were infiltrated with a lidocaine/exparel mixture. A small lacrimal duct probe was inserted into the very small midline pit and this was confirmed to communicate with the painful nodule just to the right of midline. An elliptical incision was made over the cyst just off midline and cautery was used to excise the cyst wall and sinus tract. Unfortunately this does extend to the midline and some midline skin was excised. The specimen contained chronic granulation tissue but no large hair nest was present. The wound was debrided with a ray-tec and no remaining cyst wall or granulation was present. Hemostasis was ensured with cautery. The wound was then packed with a saline-moistened 2x2 and covered with a dry dressing. The patient was then awakened, returned to the supine position and taken to PACU in stable condition.   All counts were correct at the completion of the case.

## 2017-07-19 ENCOUNTER — Telehealth: Payer: Self-pay | Admitting: Surgery

## 2017-07-19 NOTE — Telephone Encounter (Signed)
Marissa Morales  10/12/1995 [id number]  Patient Care Team: Patient, No Pcp Per as PCP - General (General Practice)  This patient is a 22 y.o.female who calls today for surgical evaluation.   Date of procedure/visit: 07/18/2017  Surgeon: Victorino Sparrow ConnorMD  Assistant: none  Procedure performed: excision pilonidal cyst  Preop diagnosis: pilonidal cyst Post-op diagnosis/intraop findings: same     Reason for call: Uncontrolled pain.  Patient status post excision of pilonidal disease with marsupialization open wound.  Patient noted she has had poor pain control with ice pack and taking 1 Vicodin every 6 hours.  She is not nauseated or throwing up.  I recommend that she take 2 hydrocodone at a time.  I recommend she take ibuprofen 800 mg every 6 hours.  Continue ice pack.  See if that can better control things.  She is worried about running out of pain medication, but that should be able to help things through the weekend.  I recommend she call Monday morning to see if she needs a refill which is highly likely she expressed appreciation  There are no active problems to display for this patient.   Past Medical History:  Diagnosis Date  . GERD (gastroesophageal reflux disease)   . Hemorrhoids   . History of asthma    childhood  . IBS (irritable bowel syndrome)   . Pilonidal cyst   . Wears glasses     Past Surgical History:  Procedure Laterality Date  . COLONOSCOPY WITH ESOPHAGOGASTRODUODENOSCOPY (EGD)  x2  last one 2015  . WISDOM TOOTH EXTRACTION  2015    Social History   Socioeconomic History  . Marital status: Single    Spouse name: Not on file  . Number of children: Not on file  . Years of education: Not on file  . Highest education level: Not on file  Occupational History  . Not on file  Social Needs  . Financial resource strain: Not on file  . Food insecurity:    Worry: Not on file    Inability: Not on file  . Transportation needs:    Medical: Not on file     Non-medical: Not on file  Tobacco Use  . Smoking status: Former Smoker    Packs/day: 0.50    Years: 5.00    Pack years: 2.50    Types: Cigarettes    Last attempt to quit: 05/15/2017    Years since quitting: 0.1  . Smokeless tobacco: Never Used  . Tobacco comment: 07-15-2017  per pt quit smoking cig. 05-15-2017 but occasionally vapes  Substance and Sexual Activity  . Alcohol use: Yes    Comment: occasional  . Drug use: No  . Sexual activity: Not on file  Lifestyle  . Physical activity:    Days per week: Not on file    Minutes per session: Not on file  . Stress: Not on file  Relationships  . Social connections:    Talks on phone: Not on file    Gets together: Not on file    Attends religious service: Not on file    Active member of club or organization: Not on file    Attends meetings of clubs or organizations: Not on file    Relationship status: Not on file  . Intimate partner violence:    Fear of current or ex partner: Not on file    Emotionally abused: Not on file    Physically abused: Not on file    Forced sexual activity: Not on  file  Other Topics Concern  . Not on file  Social History Narrative  . Not on file    Family History  Problem Relation Age of Onset  . Irritable bowel syndrome Mother   . Endometriosis Mother   . Colon cancer Mother   . Hypertension Father   . Endometriosis Sister   . Irritable bowel syndrome Sister   . Crohn's disease Sister   . Breast cancer Paternal Grandfather   . Heart attack Maternal Grandmother     Current Outpatient Medications  Medication Sig Dispense Refill  . acetaminophen (TYLENOL) 500 MG tablet Take 500 mg by mouth every 6 (six) hours as needed.    . calcium carbonate (TUMS - DOSED IN MG ELEMENTAL CALCIUM) 500 MG chewable tablet Chew 1 tablet by mouth as needed for indigestion or heartburn.    . docusate sodium (COLACE) 100 MG capsule Take 1 capsule (100 mg total) by mouth 2 (two) times daily. Ok to stop or decrease  dose if having diarrhea 60 capsule 0  . HYDROcodone-acetaminophen (NORCO/VICODIN) 5-325 MG tablet Take 1 tablet by mouth every 6 (six) hours as needed for moderate pain. 20 tablet 0  . Multiple Vitamin (MULTIVITAMIN) tablet Take 1 tablet by mouth daily.     No current facility-administered medications for this visit.      Allergies  Allergen Reactions  . Bactrim [Sulfamethoxazole-Trimethoprim] Nausea And Vomiting  . Doxycycline Nausea And Vomiting  . Vancomycin Itching    Severe pruritus of scalp, flushing  . Vantin [Cefpodoxime] Other (See Comments)    "unknown childhood reaction"    @VS @  No results found.  Note: This dictation was prepared with Dragon/digital dictation along with [employer]. Any transcriptional errors that result from this process are unintentional.   .Adin Hector, M.D., F.A.C.S. Gastrointestinal and Minimally Invasive Surgery [facility] [address] [place], [place] [address] [phone] Main / Paging  07/19/2017 1:38 PM

## 2017-07-21 ENCOUNTER — Encounter (HOSPITAL_BASED_OUTPATIENT_CLINIC_OR_DEPARTMENT_OTHER): Payer: Self-pay | Admitting: Surgery

## 2019-04-06 ENCOUNTER — Encounter: Payer: Self-pay | Admitting: Gastroenterology

## 2019-05-04 ENCOUNTER — Encounter: Payer: Self-pay | Admitting: Gastroenterology

## 2019-05-04 ENCOUNTER — Ambulatory Visit (INDEPENDENT_AMBULATORY_CARE_PROVIDER_SITE_OTHER): Payer: BC Managed Care – PPO | Admitting: Gastroenterology

## 2019-05-04 ENCOUNTER — Other Ambulatory Visit (INDEPENDENT_AMBULATORY_CARE_PROVIDER_SITE_OTHER): Payer: BC Managed Care – PPO

## 2019-05-04 VITALS — BP 120/70 | HR 68 | Temp 97.7°F | Ht 60.75 in | Wt 142.4 lb

## 2019-05-04 DIAGNOSIS — Z01818 Encounter for other preprocedural examination: Secondary | ICD-10-CM | POA: Diagnosis not present

## 2019-05-04 DIAGNOSIS — K921 Melena: Secondary | ICD-10-CM

## 2019-05-04 DIAGNOSIS — R1084 Generalized abdominal pain: Secondary | ICD-10-CM

## 2019-05-04 LAB — COMPREHENSIVE METABOLIC PANEL
ALT: 9 U/L (ref 0–35)
AST: 12 U/L (ref 0–37)
Albumin: 4.3 g/dL (ref 3.5–5.2)
Alkaline Phosphatase: 98 U/L (ref 39–117)
BUN: 11 mg/dL (ref 6–23)
CO2: 25 mEq/L (ref 19–32)
Calcium: 9.2 mg/dL (ref 8.4–10.5)
Chloride: 103 mEq/L (ref 96–112)
Creatinine, Ser: 0.44 mg/dL (ref 0.40–1.20)
GFR: 175.3 mL/min (ref >60.00)
Glucose, Bld: 96 mg/dL (ref 70–99)
Potassium: 4 mEq/L (ref 3.5–5.1)
Sodium: 135 mEq/L (ref 135–145)
Total Bilirubin: 0.3 mg/dL (ref 0.2–1.2)
Total Protein: 7.4 g/dL (ref 6.0–8.3)

## 2019-05-04 LAB — IBC + FERRITIN
Ferritin: 4 ng/mL — ABNORMAL LOW (ref 10.0–291.0)
Iron: 21 ug/dL — ABNORMAL LOW (ref 42–145)
Saturation Ratios: 4.5 % — ABNORMAL LOW (ref 20.0–50.0)
Transferrin: 335 mg/dL (ref 212.0–360.0)

## 2019-05-04 LAB — CBC
HCT: 35.6 % — ABNORMAL LOW (ref 36.0–46.0)
Hemoglobin: 11.5 g/dL — ABNORMAL LOW (ref 12.0–15.0)
MCHC: 32.1 g/dL (ref 30.0–36.0)
MCV: 77.8 fl — ABNORMAL LOW (ref 78.0–100.0)
Platelets: 390 10*3/uL (ref 150.0–400.0)
RBC: 4.58 Mil/uL (ref 3.87–5.11)
RDW: 16 % — ABNORMAL HIGH (ref 11.5–15.5)
WBC: 8 10*3/uL (ref 4.0–10.5)

## 2019-05-04 MED ORDER — NA SULFATE-K SULFATE-MG SULF 17.5-3.13-1.6 GM/177ML PO SOLN: 1.0000 | Freq: Once | ORAL | 0 refills | Status: AC

## 2019-05-04 NOTE — Patient Instructions (Addendum)
If you are age 25 or older, your body mass index should be between 23-30. Your Body mass index is 27.13 kg/m. If this is out of the aforementioned range listed, please consider follow up with your Primary Care Provider.  If you are age 48 or younger, your body mass index should be between 19-25. Your Body mass index is 27.13 kg/m. If this is out of the aformentioned range listed, please consider follow up with your Primary Care Provider.   You have been scheduled for a colonoscopy. Please follow written instructions given to you at your visit today.  Please pick up your prep supplies at the pharmacy within the next 1-3 days. If you use inhalers (even only as needed), please bring them with you on the day of your procedure.   It was a pleasure to see you today!  Dr. Loletha Carrow
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
## 2019-05-11 ENCOUNTER — Ambulatory Visit (INDEPENDENT_AMBULATORY_CARE_PROVIDER_SITE_OTHER): Payer: BC Managed Care – PPO

## 2019-05-11 ENCOUNTER — Other Ambulatory Visit: Payer: Self-pay

## 2019-05-11 ENCOUNTER — Other Ambulatory Visit: Payer: Self-pay | Admitting: Gastroenterology

## 2019-05-11 DIAGNOSIS — Z1159 Encounter for screening for other viral diseases: Secondary | ICD-10-CM

## 2019-05-11 LAB — SARS CORONAVIRUS 2 (TAT 6-24 HRS): SARS Coronavirus 2: NEGATIVE

## 2019-05-12 ENCOUNTER — Encounter: Payer: Self-pay | Admitting: Gastroenterology

## 2019-05-13 ENCOUNTER — Other Ambulatory Visit: Payer: Self-pay

## 2019-05-13 ENCOUNTER — Encounter: Payer: Self-pay | Admitting: Gastroenterology

## 2019-05-13 ENCOUNTER — Ambulatory Visit (AMBULATORY_SURGERY_CENTER): Payer: BC Managed Care – PPO | Admitting: Gastroenterology

## 2019-05-13 ENCOUNTER — Telehealth: Payer: Self-pay | Admitting: Gastroenterology

## 2019-05-13 VITALS — BP 117/73 | HR 71 | Temp 97.1°F | Resp 16

## 2019-05-13 DIAGNOSIS — R1084 Generalized abdominal pain: Secondary | ICD-10-CM

## 2019-05-13 DIAGNOSIS — D120 Benign neoplasm of cecum: Secondary | ICD-10-CM

## 2019-05-13 DIAGNOSIS — K51211 Ulcerative (chronic) proctitis with rectal bleeding: Secondary | ICD-10-CM | POA: Diagnosis not present

## 2019-05-13 DIAGNOSIS — K921 Melena: Secondary | ICD-10-CM

## 2019-05-13 DIAGNOSIS — K625 Hemorrhage of anus and rectum: Secondary | ICD-10-CM

## 2019-05-13 DIAGNOSIS — K635 Polyp of colon: Secondary | ICD-10-CM

## 2019-05-13 DIAGNOSIS — K6289 Other specified diseases of anus and rectum: Secondary | ICD-10-CM

## 2019-05-13 MED ORDER — MESALAMINE 1000 MG RE SUPP: 1000.0000 mg | Freq: Every day | RECTAL | 2 refills | Status: DC

## 2019-05-13 MED ORDER — SODIUM CHLORIDE 0.9 % IV SOLN: 500.0000 mL | Freq: Once | INTRAVENOUS | Status: DC

## 2019-05-13 MED ORDER — HYDROCORTISONE ACETATE 25 MG RE SUPP: 25.0000 mg | Freq: Two times a day (BID) | RECTAL | 0 refills | Status: DC

## 2019-05-13 NOTE — Progress Notes (Signed)
Vitals-CW Temp-LC  History reviewed. CBG 78. Dr does not want pt to have D5W.

## 2019-05-13 NOTE — Op Note (Signed)
[facility] Patient Name: Marissa Morales Procedure Date: 05/13/2019 3:18 PM MRN: [id number] Endoscopist: Mallie Mussel L. Loletha Carrow , MD Age: 24 Referring MD:  Date of Birth: August 26, 1995 Gender: Female Account #: [id number] Procedure:                Colonoscopy Indications:              Rectal bleeding (see recent office note for                            details. Patient also reports her mother was                            disgnosed with colon cancer in her 59's) Medicines:                Monitored Anesthesia Care Procedure:                Pre-Anesthesia Assessment:                           - Prior to the procedure, a History and Physical                            was performed, and patient medications and                            allergies were reviewed. The patient's tolerance of                            previous anesthesia was also reviewed. The risks                            and benefits of the procedure and the sedation                            options and risks were discussed with the patient.                            All questions were answered, and informed consent                            was obtained. Prior Anticoagulants: The patient has                            taken no previous anticoagulant or antiplatelet                            agents. ASA Grade Assessment: II - A patient with                            mild systemic disease. After reviewing the risks                            and benefits, the patient was deemed in  satisfactory condition to undergo the procedure.                           After obtaining informed consent, the colonoscope                            was passed under direct vision. Throughout the                            procedure, the patient's blood pressure, pulse, and                            oxygen saturations were monitored continuously. The                            Colonoscope was introduced  through the anus and                            advanced to the the terminal ileum, with                            identification of the appendiceal orifice and IC                            valve. The colonoscopy was performed without                            difficulty. The patient tolerated the procedure                            well. The quality of the bowel preparation was                            good. The terminal ileum, ileocecal valve,                            appendiceal orifice, and rectum were photographed. Scope In: 3:26:08 PM Scope Out: 3:50:55 PM Scope Withdrawal Time: 0 hours 21 minutes 7 seconds  Total Procedure Duration: 0 hours 24 minutes 47 seconds  Findings:                 The perianal and digital rectal examinations were                            normal.                           The terminal ileum appeared normal.                           A 15 mm polyp was found in the cecum. The polyp was                            flat with a mucus cap. The polyp was removed with a  piecemeal technique using a cold snare (and one                            remaining edge piece removed with cold biopsy                            forceps). Resection and retrieval were complete.                           Inflammation was found in a continuous and                            circumferential pattern in the rectum. This was                            graded as Mayo Score 3 (severe, with spontaneous                            bleeding, ulcerations), and when compared to the                            previous examination, the findings are new.                            Biopsies were taken with a cold forceps for                            histology.                           The exam was otherwise without abnormality. Complications:            No immediate complications. Estimated Blood Loss:     Estimated blood loss was minimal. Impression:                - The examined portion of the ileum was normal.                           - One 15 mm polyp in the cecum, removed piecemeal                            using a cold snare. Resected and retrieved.                           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 Biopsied.                           - The examination was otherwise normal. Recommendation:           - Patient has a contact number available for  emergencies. The signs and symptoms of potential                            delayed complications were discussed with the                            patient. Return to normal activities tomorrow.                            Written discharge instructions were provided to the                            patient.                           - Resume previous diet.                           - Continue present medications.                           - Await pathology results. Clinic follow up will be                            arrange after that.                           - Repeat colonoscopy is recommended for                            surveillance. The colonoscopy date will be                            determined after pathology results from today's                            exam become available for review.                           - Use hydrocortisone suppository 25 mg, 1 per                            rectum twice a day for 10 days. Disp#20, RF zero                           - After that 10 days course, start Canasa 1000 mg                            suppository 1 per rectum QHS. Disp#30, RF 2 Shaquella Stamant L. Loletha Carrow, MD 05/13/2019 4:08:09 PM This report has been signed electronically.

## 2019-05-13 NOTE — Patient Instructions (Addendum)
Start taking Hydrocortisone suppository 25 mg, 1 per rectum twice a day for 10 days.    After that 10 day course, start Canasa 1000 mg suppository 1 per rectum at bedtime.   Handouts Provided:  Polyps  YOU HAD AN ENDOSCOPIC PROCEDURE TODAY AT [facility]:   Refer to the procedure report that was given to you for any specific questions about what was found during the examination.  If the procedure report does not answer your questions, please call your gastroenterologist to clarify.  If you requested that your care partner not be given the details of your procedure findings, then the procedure report has been included in a sealed envelope for you to review at your convenience later.  YOU SHOULD EXPECT: Some feelings of bloating in the abdomen. Passage of more gas than usual.  Walking can help get rid of the air that was put into your GI tract during the procedure and reduce the bloating. If you had a lower endoscopy (such as a colonoscopy or flexible sigmoidoscopy) you may notice spotting of blood in your stool or on the toilet paper. If you underwent a bowel prep for your procedure, you may not have a normal bowel movement for a few days.  Please Note:  You might notice some irritation and congestion in your nose or some drainage.  This is from the oxygen used during your procedure.  There is no need for concern and it should clear up in a day or so.  SYMPTOMS TO REPORT IMMEDIATELY:   Following lower endoscopy (colonoscopy or flexible sigmoidoscopy):  Excessive amounts of blood in the stool  Significant tenderness or worsening of abdominal pains  Swelling of the abdomen that is new, acute  Fever of 100F or higher  For urgent or emergent issues, a gastroenterologist can be reached at any hour by calling [phone]. Do not use MyChart messaging for urgent concerns.    DIET:  We do recommend a small meal at first, but then you may proceed to your regular diet.  Drink  plenty of fluids but you should avoid alcoholic beverages for 24 hours.  ACTIVITY:  You should plan to take it easy for the rest of today and you should NOT DRIVE or use heavy machinery until tomorrow (because of the sedation medicines used during the test).    FOLLOW UP: Our staff will call the number listed on your records 48-72 hours following your procedure to check on you and address any questions or concerns that you may have regarding the information given to you following your procedure. If we do not reach you, we will leave a message.  We will attempt to reach you two times.  During this call, we will ask if you have developed any symptoms of COVID 19. If you develop any symptoms (ie: fever, flu-like symptoms, shortness of breath, cough etc.) before then, please call [phone].  If you test positive for Covid 19 in the 2 weeks post procedure, please call and report this information to [place].    If any biopsies were taken you will be contacted by phone or by letter within the next 1-3 weeks.  Please call us at [phone] if you have not heard about the biopsies in 3 weeks.    SIGNATURES/CONFIDENTIALITY: You and/or your care partner have signed paperwork which will be entered into your electronic medical record.  These signatures attest to the fact that that the information above on your After Visit Summary has  been reviewed and is understood.  Full responsibility of the confidentiality of this discharge information lies with you and/or your care-partner.

## 2019-05-13 NOTE — Telephone Encounter (Signed)
Returned pt's call.  Spoke w/ pt.  Pt stated that she drank her last prep at 10:30 am this morning.  Pt described stool as being mostly clear liquid, light brown in color with visible blood.  Pt denied having any solid stools.    Consulted w/ charge nurse Shelia B. regarding pt.  Advised pt per charge nurse to force fluids up until cut-off time of 12:30 pm.

## 2019-05-13 NOTE — Progress Notes (Signed)
pt tolerated well. VSS. awake and to recovery. Report given to RN.  

## 2019-05-17 ENCOUNTER — Telehealth: Payer: Self-pay

## 2019-05-17 NOTE — Telephone Encounter (Signed)
Left message on answering machine. 

## 2019-05-23 ENCOUNTER — Encounter: Payer: Self-pay | Admitting: Gastroenterology

## 2019-05-27 ENCOUNTER — Telehealth: Payer: Self-pay | Admitting: Gastroenterology

## 2019-05-27 NOTE — Telephone Encounter (Signed)
She can use the hydrocortisone suppositories if she would prefer.  They can be used for a maximum of 14 days.  Have her hold off on picking up the mesalamine suppository for now.  The pharmacy can keep it on file in case she needs it for persistent symptoms later.

## 2019-05-27 NOTE — Telephone Encounter (Signed)
Patient has completed 10 days of the hydrocortisone, do I call in an additional 4 days supply or a new 14 days supply? She has already picked up the [place].

## 2019-05-27 NOTE — Telephone Encounter (Signed)
Please advise 

## 2019-05-27 NOTE — Telephone Encounter (Signed)
Pt inquired whether she can take hydrocortisone and mesalamine suppositories at the same time.  She stated that the hydrocortisone helped her more.

## 2019-05-28 ENCOUNTER — Other Ambulatory Visit: Payer: Self-pay

## 2019-05-28 MED ORDER — HYDROCORTISONE ACETATE 25 MG RE SUPP: 25.0000 mg | Freq: Two times a day (BID) | RECTAL | 0 refills | Status: DC

## 2019-05-28 NOTE — Telephone Encounter (Signed)
My apologies for the confusion.  Yes, 4 more days of hydrocortisone suppository and then begin canasa

## 2019-05-28 NOTE — Telephone Encounter (Signed)
Patient has been notified and aware. Refill for Anusol BID has been refilled.

## 2019-06-15 ENCOUNTER — Telehealth (INDEPENDENT_AMBULATORY_CARE_PROVIDER_SITE_OTHER): Payer: BC Managed Care – PPO | Admitting: Gastroenterology

## 2019-06-15 ENCOUNTER — Encounter: Payer: Self-pay | Admitting: Gastroenterology

## 2019-06-15 VITALS — Ht 60.0 in | Wt 140.0 lb

## 2019-06-15 DIAGNOSIS — K635 Polyp of colon: Secondary | ICD-10-CM | POA: Diagnosis not present

## 2019-06-15 DIAGNOSIS — Z712 Person consulting for explanation of examination or test findings: Secondary | ICD-10-CM

## 2019-06-15 DIAGNOSIS — K529 Noninfective gastroenteritis and colitis, unspecified: Secondary | ICD-10-CM

## 2019-06-15 DIAGNOSIS — K51211 Ulcerative (chronic) proctitis with rectal bleeding: Secondary | ICD-10-CM

## 2019-06-15 MED ORDER — MESALAMINE 800 MG PO TBEC: 1600.0000 mg | DELAYED_RELEASE_TABLET | Freq: Two times a day (BID) | ORAL | 1 refills | Status: DC

## 2019-06-15 MED ORDER — PREDNISONE 10 MG PO TABS: 40.0000 mg | ORAL_TABLET | Freq: Every day | ORAL | 1 refills | Status: DC

## 2019-06-15 NOTE — Progress Notes (Signed)
This patient contacted our office requesting a physician telemedicine consultation regarding clinical questions and/or test results.  Participants on the conference : myself and patient   The patient consented to this consultation and was aware that a charge will be placed through their insurance.  They were also made aware of the limitations of telemedicine.  I was in my office and the patient was at home.   Encounter time:  Total time 25 minutes, with 20 minutes spent with patient on epic telemedicine platform   _____________________________________________________________________________________________                 Marissa Morales GI Progress Note  Chief Complaint: Ulcerative proctitis  Subjective  History:  Marissa Morales was recently diagnosed with ulcerative proctitis on colonoscopy with me.  She also had a cecal serrated polyp over 10 mm, and has a family history of colon cancer in her mother.  She began using mesalamine 1000 mg suppository at bedtime, and has had some improvement thus far.  She still has loose stool and rectal bleeding nearly every day. She also has some lower abdominal cramps after bowel movements. Appetite has been good and weight stable.  ROS: Cardiovascular:  no chest pain Respiratory: no dyspnea No rash, joint swelling or painful eye redness Remainder of systems negative except as above The patient's Past Medical, Family and Social History were reviewed and are on file in the EMR.  Objective:  Med list reviewed  Current Outpatient Medications:  .  acetaminophen (TYLENOL) 500 MG tablet, Take 500 mg by mouth every 6 (six) hours as needed., Disp: , Rfl:  .  alum & mag hydroxide-simeth (MAALOX/MYLANTA) 200-200-20 MG/5ML suspension, Take by mouth every 6 (six) hours as needed for indigestion or heartburn., Disp: , Rfl:  .  BIOTIN PO, Take by mouth., Disp: , Rfl:  .  GLUCOSAMINE-CHONDROITIN PO, Take by mouth., Disp: , Rfl:  .  hydrocortisone  (ANUSOL-HC) 25 MG suppository, Place 1 suppository (25 mg total) rectally 2 (two) times daily., Disp: 8 suppository, Rfl: 0 .  mesalamine (CANASA) 1000 MG suppository, Place 1 suppository (1,000 mg total) rectally at bedtime., Disp: 30 suppository, Rfl: 2 .  Multiple Vitamin (MULTIVITAMIN) tablet, Take 1 tablet by mouth daily., Disp: , Rfl:  .  Mesalamine (ASACOL HD) 800 MG TBEC, Take 2 tablets (1,600 mg total) by mouth 2 (two) times daily., Disp: 120 tablet, Rfl: 1 .  predniSONE (DELTASONE) 10 MG tablet, Take 4 tablets (40 mg total) by mouth daily with breakfast. 4 tablets once daily for 5 days, then 3 tablets once daily for 5 days, then 2 tablets once daily for 5 days, then 1 tablet daily for 5 days, then stop, Disp: 50 tablet, Rfl: 1    No exam-virtual visit  She is well-appearing  Labs:   ___________________________________________ Radiologic studies:   ____________________________________________ Other: Diagnosis 1. Surgical [P], colon, cecum, polyp - SESSILE SERRATED POLYP WITHOUT CYTOLOGIC DYSPLASIA. 2. Surgical [P], colon, rectal bx's - ACTIVE COLITIS WITH ULCERATION. - THERE IS NO EVIDENCE OF DYSPLASIA OR MALIGNANCY. - SEE COMMENT. Microscopic Comment 2. I do not see histologic features of chronicity. A CMV immunohistochemical stain is negative for the presence of viral organisms. The differential diagnosis does, however, include an infectious process. (JBK:kh 05/19/2019)  _____________________________________________ Assessment & Plan  Assessment: Encounter Diagnoses  Name Primary?  . 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 Chronic diarrhea   . Serrated polyp of colon    Marissa Morales was recently diagnosed proctitis is not yet under  good control.  We discussed IBD, its chronic nature and need for long-term meds.  She is motivated to take treatment and have follow-up to get better.  She was also appreciative of the telemedicine approach today, since she lives  at a distance from this clinic.   Plan: Short course of prednisone 40 mg a day tapering down by 10 mg every 5 days. Start Asacol HD 800 mg, 2 tablets twice daily. Continue mesalamine suppository nightly She will send me a portal message in 2 weeks with an update on symptoms.  She knows to call sooner if necessary for worsening of symptoms.  3-year colonoscopy recall was placed for the serrated polyp.  Marissa Morales

## 2019-07-06 ENCOUNTER — Telehealth: Payer: Self-pay | Admitting: Gastroenterology

## 2019-07-06 NOTE — Telephone Encounter (Signed)
I would like to see this colitis patient sooner than her current appointment of June 22nd.  There is now an opening Tues, June 1st at 340 pm.  Please encourage Kindel to come to that appt.

## 2019-07-07 ENCOUNTER — Telehealth: Payer: Self-pay

## 2019-07-07 NOTE — Telephone Encounter (Signed)
Pt scheduled for 07/20/19 @3 :40pm.

## 2019-07-07 NOTE — Telephone Encounter (Signed)
Pts appt moved to 6/1@3 :40pm.

## 2019-07-07 NOTE — Telephone Encounter (Signed)
-----   Message from Doran Stabler, MD sent at 07/06/2019  4:51 PM EDT ----- Regarding: appointment Marissa Morales,  This is a young colitis patient of mine having a flare and is on prednisone.  She sent me a portal message with her appointment date of June 22.  I would like to see her sooner, and believe I have found a spot 3:40 PM on Tuesday, June 1.  The current appt in that slot ([facility]) can be canceled because she had a telemedicine appt with me since that 6/1 appt was made.  I meant to have the office cancel it but forgot.  So this young lady can go there is willing.  Thanks  - HD

## 2019-07-20 ENCOUNTER — Encounter: Payer: Self-pay | Admitting: Gastroenterology

## 2019-07-20 ENCOUNTER — Ambulatory Visit: Payer: BC Managed Care – PPO | Admitting: Gastroenterology

## 2019-07-20 VITALS — BP 124/80 | HR 76 | Ht 61.0 in | Wt 141.4 lb

## 2019-07-20 DIAGNOSIS — K529 Noninfective gastroenteritis and colitis, unspecified: Secondary | ICD-10-CM | POA: Diagnosis not present

## 2019-07-20 DIAGNOSIS — R140 Abdominal distension (gaseous): Secondary | ICD-10-CM

## 2019-07-20 DIAGNOSIS — K51211 Ulcerative (chronic) proctitis with rectal bleeding: Secondary | ICD-10-CM

## 2019-07-20 DIAGNOSIS — R1030 Lower abdominal pain, unspecified: Secondary | ICD-10-CM

## 2019-07-20 MED ORDER — HYOSCYAMINE SULFATE 0.125 MG SL SUBL: 0.1250 mg | SUBLINGUAL_TABLET | Freq: Four times a day (QID) | SUBLINGUAL | 1 refills | Status: DC | PRN

## 2019-07-20 NOTE — Progress Notes (Signed)
[facility] Progress Note  Chief Complaint: Ulcerative proctitis  Subjective  History: Colonoscopy 05/13/2019 diagnosed moderate to severe distal proctitis.  Initially treated with hydrocortisone and mesalamine suppositories, which she was breaking through that.  Telemedicine 06/15/2019, prescribed short course of prednisone and Azacol HD 800 mg, 2 tablets twice daily.  Bleeding stopped during the prednisone course, she is still bothered by some lower abdominal crampy pain and "rumbling".  There is some mucus stools and urgency for BMs.  Typically 2 soft BMs per day. Some of the symptoms reminiscent of what she had with an IBS diagnosis at [facility] years ago.  She had tried hyoscyamine, but says it made her constipated and she required MiraLAX.  This was prior to her recent proctitis diagnosis.  She has some joint pain has been taking chondroitin for that.  Denies mouth sores or painful eye redness.  Was asking about possible dietary influences on this condition after some reading she had done.   ROS: Cardiovascular:  no chest pain Respiratory: no dyspnea Bloating and gas Fatigue Remainder of systems negative except as above The patient's Past Medical, Family and Social History were reviewed and are on file in the EMR.  Objective:  Med list reviewed  Current Outpatient Medications:  .  acetaminophen (TYLENOL) 500 MG tablet, Take 500 mg by mouth every 6 (six) hours as needed., Disp: , Rfl:  .  alum & mag hydroxide-simeth (MAALOX/MYLANTA) 200-200-20 MG/5ML suspension, Take by mouth every 6 (six) hours as needed for indigestion or heartburn., Disp: , Rfl:  .  B Complex Vitamins (VITAMIN B COMPLEX PO), Take 1 Dose by mouth daily. Liquid complex, Disp: , Rfl:  .  BIOTIN PO, Take by mouth., Disp: , Rfl:  .  Calcium Carb-Cholecalciferol (CALCIUM/VITAMIN D PO), Take 1 tablet by mouth daily., Disp: , Rfl:  .  GLUCOSAMINE-CHONDROITIN PO, Take by mouth., Disp: , Rfl:  .   hydrocortisone (ANUSOL-HC) 25 MG suppository, Place 1 suppository (25 mg total) rectally 2 (two) times daily., Disp: 8 suppository, Rfl: 0 .  Mesalamine (ASACOL HD) 800 MG TBEC, Take 2 tablets (1,600 mg total) by mouth 2 (two) times daily., Disp: 120 tablet, Rfl: 1 .  mesalamine (CANASA) 1000 MG suppository, Place 1 suppository (1,000 mg total) rectally at bedtime., Disp: 30 suppository, Rfl: 2 .  Multiple Vitamin (MULTIVITAMIN) tablet, Take 1 tablet by mouth daily., Disp: , Rfl:  .  hyoscyamine (LEVSIN SL) 0.125 MG SL tablet, Place 1 tablet (0.125 mg total) under the tongue every 6 (six) hours as needed., Disp: 45 tablet, Rfl: 1 .  predniSONE (DELTASONE) 10 MG tablet, Take 4 tablets (40 mg total) by mouth daily with breakfast. 4 tablets once daily for 5 days, then 3 tablets once daily for 5 days, then 2 tablets once daily for 5 days, then 1 tablet daily for 5 days, then stop (Patient not taking: Reported on 07/20/2019), Disp: 50 tablet, Rfl: 1   Vital signs in last 24 hrs: Vitals:   07/20/19 1535  BP: 124/80  Pulse: 76    Physical Exam  Well-appearing.  Normal gait, gets on exam table without assistance.  HEENT: sclera anicteric, oral mucosa moist without lesions  Neck: supple, no thyromegaly, JVD or lymphadenopathy  Cardiac: RRR without murmurs, S1S2 heard, no peripheral edema  Pulm: clear to auscultation bilaterally, normal RR and effort noted  Abdomen: soft, no tenderness, with active bowel sounds. No guarding or palpable hepatosplenomegaly.  Skin; warm and dry, no jaundice or rash  Labs:   ___________________________________________ Radiologic studies:   ____________________________________________ Other:   _____________________________________________ Assessment & Plan  Assessment: Encounter Diagnoses  Name Primary?  . 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 Chronic diarrhea   . Abdominal bloating   . Lower abdominal pain    The proctitis lately  seems under better control after the prednisone taper, institution of oral mesalamine in conjunction with mesalamine suppository nightly.  I suspect there is some underlying IBS as well.  Plan:  Hyoscyamine 0.125 mg, 1 every 6 hours as needed.  She can also take half tablet if she finds it causes constipation. Continue current therapy and follow-up with me in 6 weeks.  Call or send portal message sooner if needed for questions or problems. If she flares again, will most likely change oral mesalamine to budesonide (Ortikos). No good data on dietary influences, but relatively "clean" diet with few additives preservatives and refined sugar might help keep colitis under better control.  30 minutes were spent on this encounter (including chart review, history/exam, counseling/coordination of care, and documentation)  [name]

## 2019-07-20 NOTE — Patient Instructions (Addendum)
If you are age 25 or older, your body mass index should be between 23-30. Your Body mass index is 26.71 kg/m. If this is out of the aforementioned range listed, please consider follow up with your Primary Care Provider.  If you are age 42 or younger, your body mass index should be between 19-25. Your Body mass index is 26.71 kg/m. If this is out of the aformentioned range listed, please consider follow up with your Primary Care Provider.   Follow up in 6 weeks 09-16-2019 @ 1:120 pm   It was a pleasure to see you today!  Dr. Loletha Carrow

## 2019-08-06 MED ORDER — MESALAMINE 1000 MG RE SUPP: 1000.0000 mg | Freq: Every day | RECTAL | 2 refills | Status: DC

## 2019-08-06 MED ORDER — MESALAMINE 800 MG PO TBEC: 1600.0000 mg | DELAYED_RELEASE_TABLET | Freq: Two times a day (BID) | ORAL | 1 refills | Status: DC

## 2019-08-10 ENCOUNTER — Telehealth: Payer: Self-pay | Admitting: Gastroenterology

## 2019-08-10 ENCOUNTER — Ambulatory Visit: Payer: BC Managed Care – PPO | Admitting: Gastroenterology

## 2019-08-10 ENCOUNTER — Other Ambulatory Visit: Payer: Self-pay

## 2019-08-10 MED ORDER — MESALAMINE 1000 MG RE SUPP: 1000.0000 mg | Freq: Every day | RECTAL | 0 refills | Status: DC

## 2019-08-10 MED ORDER — MESALAMINE 800 MG PO TBEC: 1600.0000 mg | DELAYED_RELEASE_TABLET | Freq: Two times a day (BID) | ORAL | 1 refills | Status: DC

## 2019-08-10 NOTE — Telephone Encounter (Signed)
Patient was confused on the dose for the Levsin, she didn't realize that she was to take it every 4 hours as needed for pain, when she was having pain. She has been using it every 4 hours daily, not realizing it was as needed. Patient now are of new instructions, no further questions. Both the Canasa and Asacol have been filled as 90 days supply per insurance request.

## 2019-08-10 NOTE — Telephone Encounter (Signed)
Pt sent pt advice request earlier.

## 2019-08-16 ENCOUNTER — Other Ambulatory Visit: Payer: Self-pay

## 2019-08-16 MED ORDER — HYOSCYAMINE SULFATE 0.125 MG SL SUBL: 0.1250 mg | SUBLINGUAL_TABLET | Freq: Four times a day (QID) | SUBLINGUAL | 1 refills | Status: DC | PRN

## 2019-09-06 ENCOUNTER — Telehealth: Payer: Self-pay | Admitting: Gastroenterology

## 2019-09-06 NOTE — Telephone Encounter (Signed)
Pt states that her symptoms never totally went aware except for the pain and bleeding. Reports about a week ago the rumbling got worse and she is having bleeding again, passing mucous and blood. She is taking canasa, asacol and levsin. Pt states that she will having new health insurance August 1 and will call and reschedule her appt. But she wants to know what Dr. Loletha Carrow recommends. Please advise.

## 2019-09-07 NOTE — Telephone Encounter (Signed)
This proctitis has been difficult to get under control.  Since uninsured until August 1st, then prednisone taper as follows:  30 mg once daily for 7 days, then 20 mg once daily for 7 days, then 10 mg once daily for 7 days. (stop the asacol while taking this but continue cansa suppository)  When insurance resumes August 1st, begin Ortikos 9 mg once daily.  Disp #30 RF zero If able to get that (affordable), stay off asacol and continue other meds  Please make sure she has an appointment to see me by around Labor day week since she has to cancel next week's appointment.

## 2019-09-08 ENCOUNTER — Other Ambulatory Visit: Payer: Self-pay

## 2019-09-08 MED ORDER — PREDNISONE 10 MG PO TABS: ORAL_TABLET | ORAL | 0 refills | Status: DC

## 2019-09-08 MED ORDER — ORTIKOS 9 MG PO CP24: 9.0000 mg | ORAL_CAPSULE | Freq: Every day | ORAL | 0 refills | Status: DC

## 2019-09-08 NOTE — Telephone Encounter (Signed)
Attempted to call pt, unable to reach and unable to leave message at this time. Will try again.  Pt sent information below via mychart. Pt scheduled to see Dr. Loletha Carrow 10/26/19 at 1:20pm and prescriptions have been sent to the pharmacy.

## 2019-09-15 ENCOUNTER — Telehealth: Payer: Self-pay

## 2019-09-15 NOTE — Telephone Encounter (Signed)
patients current insurance is inactive. New insurance to activate on 09-19-2019. Pt will provide [place] with that info after 09-19-2019

## 2019-09-15 NOTE — Telephone Encounter (Signed)
I received a prior auth request for Ortikos. I tried to complete on cover my meds.  It will not allow me to finish the process and requires I call the number on the back of the patients prescription card.  Patient not answering calls, and voicemail box is full. Mychart message has been sent to patient to please call me at the office.

## 2019-09-16 ENCOUNTER — Ambulatory Visit: Payer: BC Managed Care – PPO | Admitting: Gastroenterology

## 2019-09-21 ENCOUNTER — Telehealth: Payer: Self-pay | Admitting: Gastroenterology

## 2019-09-21 NOTE — Telephone Encounter (Signed)
Sierra from [employer] is wanting to know if a pre authorization for the pt's Ortikos still needs to be pursued.  [phone]   [id number]

## 2019-09-22 MED ORDER — ORTIKOS 9 MG PO CP24: 9.0000 mg | ORAL_CAPSULE | Freq: Every day | ORAL | 0 refills | Status: DC

## 2019-09-22 NOTE — Addendum Note (Signed)
Addended by: Elias Else on: 09/22/2019 02:40 PM   Modules accepted: Orders

## 2019-09-22 NOTE — Telephone Encounter (Addendum)
New Rx with a new pharmacy has been requested now that the new insurance is active. New Rx has been sent to [facility] in [facility]

## 2019-09-23 NOTE — Telephone Encounter (Signed)
PA request sent to new insurance plan via cover my meds for Ortikos and Canasa supp.

## 2019-09-27 ENCOUNTER — Other Ambulatory Visit: Payer: Self-pay

## 2019-09-27 MED ORDER — ROWASA 4 G RE KIT: 4.0000 g | PACK | Freq: Every day | RECTAL | 0 refills | Status: DC

## 2019-09-27 NOTE — Telephone Encounter (Signed)
Unfortunately, Marissa Morales's new insurance seems to have a limited formulary and therefore significant restrictions on allowable medicines to treat her proctitis.  While I certainly confirm that she has ulcerative colitis (proctitis being a more limited form of colitis), and that she seems to have failed Asacol and Canasa (because she continues to have breakthrough symptoms despite the use of those), her insurance is declining Ortikos for less clear reasons.  As near as I can determine from the documents are received, they require a trial and failure of at least 3 formulary approved medications.  At this point, in order to make a case for prior authorization, we have no choice but to have her try Rowasa 4 g enema, use 1 every night and retain as long as possible.  Please send a prescription for 28 individual enemas, no refill.  That will give her enough to try between now and her upcoming clinic visit with me.  Perhaps this treatment will be more successful than the other things she has tried.  If so, then we will likely continue it.  If not, I will try to make a case for prior authorization of the other medicines.  - HD

## 2019-09-27 NOTE — Telephone Encounter (Signed)
Rx has been sent to [facility] and a [employer] has been sent with this info.

## 2019-09-29 ENCOUNTER — Other Ambulatory Visit: Payer: Self-pay

## 2019-09-29 ENCOUNTER — Telehealth: Payer: Self-pay | Admitting: Gastroenterology

## 2019-09-29 MED ORDER — MESALAMINE 4 G RE ENEM: 4.0000 g | ENEMA | Freq: Every day | RECTAL | 0 refills | Status: DC

## 2019-09-29 NOTE — Telephone Encounter (Signed)
Vivien Rota,  Re: Octavia's question on medication management:  I have had some difficulty keeping track of which oral medicine she has been able to get and take lately, what with the changes in insurance and need for prior authorizations and such.  Here is my recommendation:  The Asacol does not seem like it has been helping her much, so I recommended she stop it.  It is the reason I wanted to change to Ortikos or Uceris.  If she has been able to get the Ortikos, then I would like her to take that, 1 tablet daily  Also use the newly prescribed mesalamine enema nightly.  - HD

## 2019-10-01 NOTE — Telephone Encounter (Signed)
I sent her a mychart message on 09-29-2019 and I have tried to call her multiple times. Her voicemail box is full.  Today I sent another mychart message

## 2019-10-01 NOTE — Telephone Encounter (Signed)
Thank you for the update on her medicines.  I am hopeful the Rowasa enema will give her some improvement in symptoms.  I will plan to see her soon, and will offer her an earlier appointment if one becomes available.

## 2019-10-01 NOTE — Telephone Encounter (Signed)
Information has been given to [place].

## 2019-10-01 NOTE — Telephone Encounter (Signed)
See below for communication from [facility].   I also take calcium and I just finished the last ound of prednisone he prescribed. I had no issue with it besides daily headaches. I also tried a half dose of milk of magnesia due to the difficulty    Renell, Coaxum  You 9 minutes ago (3:11 PM)  ST I take the asacol 2 tablets 2x daily, that one he wanted me off of. I have been passing the whole tablets every time I take it. I'm taking the Hyoscyamine. 125 x2 daily. I also take b12, multivitamin, zinc, and glucosamine.  I have had to take miralax the past few days with the bleeding being worse I've for some reason been struggling with difficulty using the bathroom.

## 2019-10-06 ENCOUNTER — Other Ambulatory Visit (INDEPENDENT_AMBULATORY_CARE_PROVIDER_SITE_OTHER): Payer: 59

## 2019-10-06 ENCOUNTER — Encounter: Payer: Self-pay | Admitting: Gastroenterology

## 2019-10-06 ENCOUNTER — Ambulatory Visit: Payer: 59 | Admitting: Gastroenterology

## 2019-10-06 VITALS — BP 122/78 | HR 81 | Ht 61.0 in | Wt 148.0 lb

## 2019-10-06 DIAGNOSIS — R12 Heartburn: Secondary | ICD-10-CM

## 2019-10-06 DIAGNOSIS — K51211 Ulcerative (chronic) proctitis with rectal bleeding: Secondary | ICD-10-CM

## 2019-10-06 DIAGNOSIS — K921 Melena: Secondary | ICD-10-CM

## 2019-10-06 DIAGNOSIS — D500 Iron deficiency anemia secondary to blood loss (chronic): Secondary | ICD-10-CM

## 2019-10-06 DIAGNOSIS — R140 Abdominal distension (gaseous): Secondary | ICD-10-CM

## 2019-10-06 DIAGNOSIS — R1030 Lower abdominal pain, unspecified: Secondary | ICD-10-CM

## 2019-10-06 LAB — CBC WITH DIFFERENTIAL/PLATELET
Basophils Absolute: 0.1 10*3/uL (ref 0.0–0.1)
Basophils Relative: 0.6 % (ref 0.0–3.0)
Eosinophils Absolute: 0.3 10*3/uL (ref 0.0–0.7)
Eosinophils Relative: 3.9 % (ref 0.0–5.0)
HCT: 40.4 % (ref 36.0–46.0)
Hemoglobin: 13.4 g/dL (ref 12.0–15.0)
Lymphocytes Relative: 27.7 % (ref 12.0–46.0)
Lymphs Abs: 2.4 10*3/uL (ref 0.7–4.0)
MCHC: 33.2 g/dL (ref 30.0–36.0)
MCV: 83.7 fl (ref 78.0–100.0)
Monocytes Absolute: 0.6 10*3/uL (ref 0.1–1.0)
Monocytes Relative: 6.9 % (ref 3.0–12.0)
Neutro Abs: 5.3 10*3/uL (ref 1.4–7.7)
Neutrophils Relative %: 60.9 % (ref 43.0–77.0)
Platelets: 358 10*3/uL (ref 150.0–400.0)
RBC: 4.83 Mil/uL (ref 3.87–5.11)
RDW: 16.4 % — ABNORMAL HIGH (ref 11.5–15.5)
WBC: 8.7 10*3/uL (ref 4.0–10.5)

## 2019-10-06 LAB — COMPREHENSIVE METABOLIC PANEL
ALT: 14 U/L (ref 0–35)
AST: 13 U/L (ref 0–37)
Albumin: 4.5 g/dL (ref 3.5–5.2)
Alkaline Phosphatase: 83 U/L (ref 39–117)
BUN: 9 mg/dL (ref 6–23)
CO2: 28 mEq/L (ref 19–32)
Calcium: 9.7 mg/dL (ref 8.4–10.5)
Chloride: 99 mEq/L (ref 96–112)
Creatinine, Ser: 0.54 mg/dL (ref 0.40–1.20)
GFR: 137.92 mL/min (ref >60.00)
Glucose, Bld: 90 mg/dL (ref 70–99)
Potassium: 3.5 mEq/L (ref 3.5–5.1)
Sodium: 137 mEq/L (ref 135–145)
Total Bilirubin: 0.5 mg/dL (ref 0.2–1.2)
Total Protein: 7.6 g/dL (ref 6.0–8.3)

## 2019-10-06 LAB — IBC + FERRITIN
Ferritin: 18.4 ng/mL (ref 10.0–291.0)
Iron: 76 ug/dL (ref 42–145)
Saturation Ratios: 16.4 % — ABNORMAL LOW (ref 20.0–50.0)
Transferrin: 331 mg/dL (ref 212.0–360.0)

## 2019-10-06 LAB — SEDIMENTATION RATE: Sed Rate: 19 mm/hr (ref 0–20)

## 2019-10-06 MED ORDER — METOCLOPRAMIDE HCL 5 MG PO TABS
5.0000 mg | ORAL_TABLET | Freq: Two times a day (BID) | ORAL | 0 refills | Status: DC | PRN
Start: 2019-10-06 — End: 2019-12-30

## 2019-10-06 MED ORDER — OMEPRAZOLE 40 MG PO CPDR
40.0000 mg | DELAYED_RELEASE_CAPSULE | Freq: Every day | ORAL | 1 refills | Status: DC
Start: 2019-10-06 — End: 2019-11-03

## 2019-10-06 NOTE — Progress Notes (Signed)
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  It has been difficult to get under control, and she has really never felt back to normal since she has been on treatment after the colonoscopy.  She has been on Asacol, but does not feel it is helping, and often sees whole tablets passing into the toilet.  Marissa Morales has probably helped more than anything but she still has frequent episodes of urgency and loose mucus stool.  We recently try to get budesonide but insurance would not approve it.  With her new insurance a couple weeks ago and after significant efforts on the part of our staff, we were able to get approval for mesalamine enema.  (Her insurance has significant restrictions on formulary/prior authorization requirements).  At least two courses of prednisone given in last several months.  Marissa Morales has not been feeling well overall.  She has daily rectal bleeding, and lately has been constipated with the need for laxatives.  She has lower abdominal pain and bloating.  Also, she has developed regurgitation and pyrosis which has been an occasional problem in years past.  She denies dysphagia or odynophagia.  If her reflux gets bad she will also become nauseated. She has been able to use the mesalamine enema nightly for about the last week and retain it overnight.  Unfortunately, she has not seen any improvement in symptoms yet. ROS: Cardiovascular:  no chest pain Respiratory: no dyspnea Knee pain after a sprain in June.  She has been seen by orthopedics and physical therapy. Remainder of systems negative except as above The patient's Past Medical, Family and Social History were reviewed and are on file in the EMR.  Objective:  Med list  reviewed  Current Outpatient Medications:  .  acetaminophen (TYLENOL) 500 MG tablet, Take 500 mg by mouth every 6 (six) hours as needed., Disp: , Rfl:  .  alum & mag hydroxide-simeth (MAALOX/MYLANTA) 200-200-20 MG/5ML suspension, Take by mouth every 6 (six) hours as needed for indigestion or heartburn., Disp: , Rfl:  .  B Complex Vitamins (VITAMIN B COMPLEX PO), Take 1 Dose by mouth daily. Liquid complex, Disp: , Rfl:  .  BIOTIN PO, Take by mouth., Disp: , Rfl:  .  Calcium Carb-Cholecalciferol (CALCIUM/VITAMIN D PO), Take 1 tablet by mouth daily., Disp: , Rfl:  .  GLUCOSAMINE-CHONDROITIN PO, Take by mouth., Disp: , Rfl:  .  hyoscyamine (LEVSIN SL) 0.125 MG SL tablet, Place 1 tablet (0.125 mg total) under the tongue every 6 (six) hours as needed., Disp: 45 tablet, Rfl: 1 .  Mesalamine (ASACOL HD) 800 MG TBEC, Take 2 tablets (1,600 mg total) by mouth 2 (two) times daily., Disp: 360 tablet, Rfl: 1 .  mesalamine (ROWASA) 4 g enema, Place 60 mLs (4 g total) rectally at bedtime., Disp: 1680 mL, Rfl: 0 .  Multiple Vitamin (MULTIVITAMIN) tablet, Take 1 tablet by mouth daily., Disp: , Rfl:  .  metoCLOPramide (REGLAN) 5 MG tablet, Take 1 tablet (5 mg total) by mouth every 12 (twelve) hours as needed for up to 2 doses for nausea. Take 30-45 minutes before evening and AM doses of bowel preparation solution., Disp: 2 tablet, Rfl: 0 .  omeprazole (PRILOSEC) 40 MG capsule, Take 1 capsule (40 mg  total) by mouth daily., Disp: 30 capsule, Rfl: 1   Vital signs in last 24 hrs: Vitals:   10/06/19 1055  BP: 122/78  Pulse: 81    Physical Exam  Not acutely ill-appearing.  HEENT: sclera anicteric, oral mucosa moist without lesions  Neck: supple, no thyromegaly, JVD or lymphadenopathy  Cardiac: RRR without murmurs, S1S2 heard, no peripheral edema  Pulm: clear to auscultation bilaterally, normal RR and effort noted  Abdomen: soft, no tenderness, with active bowel sounds. No guarding or palpable  hepatosplenomegaly.  Skin; warm and dry, no jaundice or rash  Labs:   ___________________________________________ Radiologic studies:   ____________________________________________ Other:   _____________________________________________ Assessment & Plan  Assessment: Encounter Diagnoses  Name Primary?  . 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 Abdominal bloating   . Lower abdominal pain   . Hematochezia   . Heartburn   . 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  I think she is likely heading toward biologic therapy, we discussed the possibility of Humira or Entyvio.  She had done some reading about it on the Crohn's and colitis website after one of our visits.  I would favor using Entyvio due to its greater safety profile even though it requires IV treatment.  Iron deficiency anemia from chronic GI and menstrual blood loss as well as chronic inflammatory condition. Recent resurgence of reflux symptoms coinciding with poor control of proctitis.  Plan: Continue mesalamine enema nightly Sigmoidoscopy in the next few weeks. CBC, iron studies, CMP, ESR, vitamin D level, hepatitis B serologies (she was previously vaccinated, but will be necessary for insurance approval of Biologics), TB QuantiFERON gold  Omeprazole 40 mg daily before breakfast  40 minutes were spent on this encounter (including chart review, history/exam, counseling/coordination of care, and documentation)  [name]

## 2019-10-06 NOTE — Patient Instructions (Addendum)
If you are age 25 or older, your body mass index should be between 23-30. Your Body mass index is 27.96 kg/m. If this is out of the aforementioned range listed, please consider follow up with your Primary Care Provider.  If you are age 60 or younger, your body mass index should be between 19-25. Your Body mass index is 27.96 kg/m. If this is out of the aformentioned range listed, please consider follow up with your Primary Care Provider.  You have been scheduled for a flexible sigmoidoscopy. Please follow the written instructions given to you at your visit today. If you use inhalers (even only as needed), please bring them with you on the day of your procedure.  Your provider has requested that you go to the basement level for lab work before leaving today. Press "B" on the elevator. The lab is located at the first door on the left as you exit the elevator.  Due to recent changes in healthcare laws, you may see the results of your imaging and laboratory studies on MyChart before your provider has had a chance to review them.  We understand that in some cases there may be results that are confusing or concerning to you. Not all laboratory results come back in the same time frame and the provider may be waiting for multiple results in order to interpret others.  Please give [place] 48 hours in order for your provider to thoroughly review all the results before contacting the office for clarification of your results.   It was a pleasure to see you today!  Dr. Loletha Carrow

## 2019-10-10 LAB — QUANTIFERON-TB GOLD PLUS
Mitogen-NIL: >10 IU/mL
NIL: 0.03 IU/mL
QuantiFERON-TB Gold Plus: NEGATIVE
TB1-NIL: 0.01 IU/mL
TB2-NIL: 0 IU/mL

## 2019-10-10 LAB — VITAMIN D 1,25 DIHYDROXY
Vitamin D 1, 25 (OH)2 Total: 45 pg/mL (ref 18–72)
Vitamin D2 1, 25 (OH)2: <8 pg/mL
Vitamin D3 1, 25 (OH)2: 45 pg/mL

## 2019-10-10 LAB — HEPATITIS B SURFACE ANTIGEN: Hepatitis B Surface Ag: NONREACTIVE

## 2019-10-10 LAB — HEPATITIS B SURFACE ANTIBODY,QUALITATIVE: Hep B S Ab: REACTIVE — AB

## 2019-10-15 ENCOUNTER — Telehealth: Payer: Self-pay | Admitting: Gastroenterology

## 2019-10-15 NOTE — Telephone Encounter (Signed)
Cover [employer] called to inform that prescription for Ortikos was denied. They want to know if we want to appeal or prescribe something different. Pls call them at [phone], ref # [id number].

## 2019-10-18 NOTE — Telephone Encounter (Signed)
Medication has been changed and no PA needed at this time

## 2019-10-20 ENCOUNTER — Encounter: Payer: Self-pay | Admitting: Gastroenterology

## 2019-10-22 ENCOUNTER — Ambulatory Visit (INDEPENDENT_AMBULATORY_CARE_PROVIDER_SITE_OTHER): Payer: 59

## 2019-10-22 ENCOUNTER — Other Ambulatory Visit: Payer: Self-pay | Admitting: Gastroenterology

## 2019-10-22 DIAGNOSIS — Z1159 Encounter for screening for other viral diseases: Secondary | ICD-10-CM

## 2019-10-22 LAB — SARS CORONAVIRUS 2 (TAT 6-24 HRS): SARS Coronavirus 2: NEGATIVE

## 2019-10-26 ENCOUNTER — Encounter: Payer: Self-pay | Admitting: Certified Registered Nurse Anesthetist

## 2019-10-26 ENCOUNTER — Ambulatory Visit: Payer: BC Managed Care – PPO | Admitting: Gastroenterology

## 2019-10-27 ENCOUNTER — Telehealth: Payer: Self-pay | Admitting: Gastroenterology

## 2019-10-27 ENCOUNTER — Encounter: Payer: Self-pay | Admitting: Gastroenterology

## 2019-10-27 ENCOUNTER — Other Ambulatory Visit: Payer: Self-pay

## 2019-10-27 ENCOUNTER — Ambulatory Visit (AMBULATORY_SURGERY_CENTER): Payer: 59 | Admitting: Gastroenterology

## 2019-10-27 VITALS — BP 120/79 | HR 79 | Temp 96.9°F | Resp 21 | Ht 61.0 in | Wt 148.0 lb

## 2019-10-27 DIAGNOSIS — K51211 Ulcerative (chronic) proctitis with rectal bleeding: Secondary | ICD-10-CM | POA: Diagnosis not present

## 2019-10-27 MED ORDER — SODIUM CHLORIDE 0.9 % IV SOLN: 500.0000 mL | Freq: Once | INTRAVENOUS | Status: DC

## 2019-10-27 NOTE — Telephone Encounter (Signed)
[place],  I saw this patient for sigmoidoscopy today to re-evaluate her ulcerative proctitis.  She and I spoke afterwards and agreed it is time to start Entyvio (vedolizumab) therapy for her condition. Please start working on referral to [facility], and I believe they usually do the insurance authorization.  - HD

## 2019-10-27 NOTE — Op Note (Signed)
[facility] Patient Name: Marissa Morales Procedure Date: 10/27/2019 10:55 AM MRN: [id number] Endoscopist: Mallie Mussel L. Loletha Carrow , MD Age: 24 Referring MD:  Date of Birth: 1995/04/27 Gender: Female Account #: [id number] Procedure:                Flexible Sigmoidoscopy Indications:              Disease activity assessment of chronic ulcerative                            proctitis (Dx March 2021,symptoms improved with                            initial prednisone course, but have not been able                            to maintain control with mesalamine tablets and                            suppository.budesonide denied by insurance.patient                            more recently on mesalamine enema) Medicines:                Monitored Anesthesia Care Procedure:                Pre-Anesthesia Assessment:                           - Prior to the procedure, a History and Physical                            was performed, and patient medications and                            allergies were reviewed. The patient's tolerance of                            previous anesthesia was also reviewed. The risks                            and benefits of the procedure and the sedation                            options and risks were discussed with the patient.                            All questions were answered, and informed consent                            was obtained. Prior Anticoagulants: The patient has                            taken no previous anticoagulant or antiplatelet  agents. ASA Grade Assessment: II - A patient with                            mild systemic disease. After reviewing the risks                            and benefits, the patient was deemed in                            satisfactory condition to undergo the procedure.                           After obtaining informed consent, the scope was                            passed under direct  vision. The Colonoscope was                            introduced through the anus and advanced to the the                            splenic flexure. The flexible sigmoidoscopy was                            accomplished without difficulty. The patient                            tolerated the procedure well. The quality of the                            bowel preparation was good (miralax prep and                            Fleeets enemas). Scope In: Scope Out: Findings:                 The perianal and digital rectal examinations were                            normal.                           Inflammation was found as patches surrounded by                            normal mucosa in the proximal and mid rectum. This                            was graded as Mayo Score 2 (moderate, with marked                            erythema, absent vascular pattern, friability,                            erosions), and when  compared to the previous                            examination, the findings are improved (that is, it                            currently involves less of the overall rectal                            mucosal surface area - prox.mid without distal -                            but the degree of inflammation is similar to prior                            exam). Complications:            No immediate complications. Estimated Blood Loss:     Estimated blood loss: none. Impression:               - Moderately active (Mayo Score 2) proctitis                            ulcerative colitis, somewhat improved since the                            last examination.                           - No specimens collected. Recommendation:           - Patient has a contact number available for                            emergencies. The signs and symptoms of potential                            delayed complications were discussed with the                            patient. Return to normal  activities tomorrow.                            Written discharge instructions were provided to the                            patient.                           - Resume previous diet.                           - Continue present medications.                           - Further discussion re: vedolizumab therapy. Aaryn Sermon L. Loletha Carrow, MD 10/27/2019 12:09:58 PM This report has been signed electronically.

## 2019-10-27 NOTE — Progress Notes (Signed)
Ortho provider put patient on Pennsaid topical cream for left knee.  Hasnt started it yet.  Hc - vitals

## 2019-10-27 NOTE — Patient Instructions (Signed)
YOU HAD AN ENDOSCOPIC PROCEDURE TODAY AT THE [facility]:   Refer to the procedure report that was given to you for any specific questions about what was found during the examination.  If the procedure report does not answer your questions, please call your gastroenterologist to clarify.  If you requested that your care partner not be given the details of your procedure findings, then the procedure report has been included in a sealed envelope for you to review at your convenience later.  YOU SHOULD EXPECT: Some feelings of bloating in the abdomen. Passage of more gas than usual.  Walking can help get rid of the air that was put into your GI tract during the procedure and reduce the bloating. If you had a lower endoscopy (such as a colonoscopy or flexible sigmoidoscopy) you may notice spotting of blood in your stool or on the toilet paper. If you underwent a bowel prep for your procedure, you may not have a normal bowel movement for a few days.  Please Note:  You might notice some irritation and congestion in your nose or some drainage.  This is from the oxygen used during your procedure.  There is no need for concern and it should clear up in a day or so.  SYMPTOMS TO REPORT IMMEDIATELY:   Following lower endoscopy (colonoscopy or flexible sigmoidoscopy):  Excessive amounts of blood in the stool  Significant tenderness or worsening of abdominal pains  Swelling of the abdomen that is new, acute  Fever of 100F or higher  For urgent or emergent issues, a gastroenterologist can be reached at any hour by calling [phone]. Do not use MyChart messaging for urgent concerns.    DIET:  We do recommend a small meal at first, but then you may proceed to your regular diet.  Drink plenty of fluids but you should avoid alcoholic beverages for 24 hours.  ACTIVITY:  You should plan to take it easy for the rest of today and you should NOT DRIVE or use heavy machinery until tomorrow (because  of the sedation medicines used during the test).    FOLLOW UP: Our staff will call the number listed on your records 48-72 hours following your procedure to check on you and address any questions or concerns that you may have regarding the information given to you following your procedure. If we do not reach you, we will leave a message.  We will attempt to reach you two times.  During this call, we will ask if you have developed any symptoms of COVID 19. If you develop any symptoms (ie: fever, flu-like symptoms, shortness of breath, cough etc.) before then, please call [phone].  If you test positive for Covid 19 in the 2 weeks post procedure, please call and report this information to us.    If any biopsies were taken you will be contacted by phone or by letter within the next 1-3 weeks.  Please call us at [phone] if you have not heard about the biopsies in 3 weeks.    SIGNATURES/CONFIDENTIALITY: You and/or your care partner have signed paperwork which will be entered into your electronic medical record.  These signatures attest to the fact that that the information above on your After Visit Summary has been reviewed and is understood.  Full responsibility of the confidentiality of this discharge information lies with you and/or your care-partner. 

## 2019-10-27 NOTE — Progress Notes (Signed)
Report given to PACU, vss 

## 2019-10-28 ENCOUNTER — Other Ambulatory Visit: Payer: Self-pay

## 2019-10-28 DIAGNOSIS — K51211 Ulcerative (chronic) proctitis with rectal bleeding: Secondary | ICD-10-CM

## 2019-10-28 MED ORDER — MESALAMINE 4 G RE ENEM: 4.0000 g | ENEMA | Freq: Every day | RECTAL | 0 refills | Status: DC

## 2019-10-28 NOTE — Telephone Encounter (Signed)
Referral and records faxed to [facility] to begin [facility] therapy.

## 2019-10-29 ENCOUNTER — Telehealth: Payer: Self-pay

## 2019-10-29 ENCOUNTER — Other Ambulatory Visit: Payer: Self-pay

## 2019-10-29 MED ORDER — HYOSCYAMINE SULFATE 0.125 MG SL SUBL: 0.1250 mg | SUBLINGUAL_TABLET | Freq: Four times a day (QID) | SUBLINGUAL | 1 refills | Status: DC | PRN

## 2019-10-29 NOTE — Telephone Encounter (Signed)
Patient returned the call is doing well no questions at this time

## 2019-10-29 NOTE — Telephone Encounter (Signed)
Mailbox was full.  Unable to leave message on 2nd follow up call.

## 2019-10-29 NOTE — Telephone Encounter (Signed)
Left message on follow up call. 

## 2019-11-01 NOTE — Telephone Encounter (Signed)
Called [facility] to follow up on referral, they stated that they have the patient in the chart but will check with the new patient coordinator to see if they have a hard copy of the referral and she will give me a call back

## 2019-11-01 NOTE — Telephone Encounter (Signed)
Received call back from [facility], requesting that I refax referral and records. Both have been refaxed on 11/01/19, with attention to [facility].

## 2019-11-02 NOTE — Telephone Encounter (Signed)
Lm on vm for [facility] at [facility] to see if referral had been received.

## 2019-11-02 NOTE — Telephone Encounter (Signed)
Per patient message she is scheduled for her first Entyvio infusion on Thursday at [facility]

## 2019-11-03 ENCOUNTER — Other Ambulatory Visit: Payer: Self-pay

## 2019-11-03 ENCOUNTER — Other Ambulatory Visit: Payer: Self-pay | Admitting: Gastroenterology

## 2019-11-10 NOTE — Telephone Encounter (Signed)
Lm on vm for patient to return call 

## 2019-11-11 ENCOUNTER — Telehealth: Payer: Self-pay | Admitting: Gastroenterology

## 2019-11-11 NOTE — Telephone Encounter (Signed)
Spoke with patient, she states that she had a question regarding the meningococcal vaccine and if it was necessary. Advised patient that this question would be best answered by her PCP as they have all of her previous vaccine records.

## 2019-11-25 ENCOUNTER — Emergency Department (HOSPITAL_BASED_OUTPATIENT_CLINIC_OR_DEPARTMENT_OTHER): Payer: 59

## 2019-11-25 ENCOUNTER — Other Ambulatory Visit: Payer: Self-pay

## 2019-11-25 ENCOUNTER — Encounter (HOSPITAL_BASED_OUTPATIENT_CLINIC_OR_DEPARTMENT_OTHER): Payer: Self-pay | Admitting: *Deleted

## 2019-11-25 ENCOUNTER — Emergency Department (HOSPITAL_BASED_OUTPATIENT_CLINIC_OR_DEPARTMENT_OTHER)
Admission: EM | Admit: 2019-11-25 | Discharge: 2019-11-25 | Disposition: A | Discharge status: Home / Self Care | Payer: 59 | Attending: Emergency Medicine | Admitting: Emergency Medicine

## 2019-11-25 ENCOUNTER — Telehealth: Payer: Self-pay | Admitting: Gastroenterology

## 2019-11-25 DIAGNOSIS — E119 Type 2 diabetes mellitus without complications: Secondary | ICD-10-CM | POA: Insufficient documentation

## 2019-11-25 DIAGNOSIS — Z20822 Contact with and (suspected) exposure to covid-19: Secondary | ICD-10-CM | POA: Insufficient documentation

## 2019-11-25 DIAGNOSIS — K51911 Ulcerative colitis, unspecified with rectal bleeding: Secondary | ICD-10-CM | POA: Diagnosis not present

## 2019-11-25 DIAGNOSIS — K625 Hemorrhage of anus and rectum: Secondary | ICD-10-CM | POA: Diagnosis present

## 2019-11-25 DIAGNOSIS — Z87891 Personal history of nicotine dependence: Secondary | ICD-10-CM | POA: Diagnosis not present

## 2019-11-25 LAB — URINALYSIS, ROUTINE W REFLEX MICROSCOPIC
Bilirubin Urine: NEGATIVE
Glucose, UA: NEGATIVE mg/dL
Hgb urine dipstick: NEGATIVE
Ketones, ur: NEGATIVE mg/dL
Nitrite: NEGATIVE
Protein, ur: NEGATIVE mg/dL
Specific Gravity, Urine: 1.01 (ref 1.005–1.030)
pH: 7 (ref 5.0–8.0)

## 2019-11-25 LAB — COMPREHENSIVE METABOLIC PANEL
ALT: 13 U/L (ref 0–44)
AST: 16 U/L (ref 15–41)
Albumin: 4.2 g/dL (ref 3.5–5.0)
Alkaline Phosphatase: 91 U/L (ref 38–126)
Anion gap: 9 (ref 5–15)
BUN: 9 mg/dL (ref 6–20)
CO2: 26 mmol/L (ref 22–32)
Calcium: 9.1 mg/dL (ref 8.9–10.3)
Chloride: 103 mmol/L (ref 98–111)
Creatinine, Ser: 0.69 mg/dL (ref 0.44–1.00)
GFR calc non Af Amer: >60 mL/min (ref >60)
Glucose, Bld: 108 mg/dL — ABNORMAL HIGH (ref 70–99)
Potassium: 3.4 mmol/L — ABNORMAL LOW (ref 3.5–5.1)
Sodium: 138 mmol/L (ref 135–145)
Total Bilirubin: 0.6 mg/dL (ref 0.3–1.2)
Total Protein: 7.8 g/dL (ref 6.5–8.1)

## 2019-11-25 LAB — URINALYSIS, MICROSCOPIC (REFLEX)

## 2019-11-25 LAB — CBC
HCT: 39.7 % (ref 36.0–46.0)
Hemoglobin: 12.9 g/dL (ref 12.0–15.0)
MCH: 27.8 pg (ref 26.0–34.0)
MCHC: 32.5 g/dL (ref 30.0–36.0)
MCV: 85.6 fL (ref 80.0–100.0)
Platelets: 426 10*3/uL — ABNORMAL HIGH (ref 150–400)
RBC: 4.64 MIL/uL (ref 3.87–5.11)
RDW: 13.9 % (ref 11.5–15.5)
WBC: 10.6 10*3/uL — ABNORMAL HIGH (ref 4.0–10.5)
nRBC: 0 % (ref 0.0–0.2)

## 2019-11-25 LAB — RESPIRATORY PANEL BY RT PCR (FLU A&B, COVID)
Influenza A by PCR: NEGATIVE
Influenza B by PCR: NEGATIVE
SARS Coronavirus 2 by RT PCR: NEGATIVE

## 2019-11-25 LAB — PREGNANCY, URINE: Preg Test, Ur: NEGATIVE

## 2019-11-25 MED ORDER — SODIUM CHLORIDE 0.9 % IV SOLN: INTRAVENOUS | Status: DC

## 2019-11-25 MED ORDER — SODIUM CHLORIDE 0.9 % IV BOLUS
1000.0000 mL | Freq: Once | INTRAVENOUS | Status: AC
  Administered 2019-11-25: 1000 mL via INTRAVENOUS

## 2019-11-25 MED ORDER — METHYLPREDNISOLONE SODIUM SUCC 40 MG IJ SOLR
40.0000 mg | Freq: Once | INTRAMUSCULAR | Status: AC
  Administered 2019-11-25: 40 mg via INTRAVENOUS
  Filled 2019-11-25: qty 1

## 2019-11-25 MED ORDER — ONDANSETRON HCL 4 MG/2ML IJ SOLN
4.0000 mg | Freq: Once | INTRAMUSCULAR | Status: AC
  Administered 2019-11-25: 4 mg via INTRAVENOUS
  Filled 2019-11-25: qty 2

## 2019-11-25 MED ORDER — ONDANSETRON 4 MG PO TBDP: ORAL_TABLET | ORAL | 0 refills | Status: DC

## 2019-11-25 MED ORDER — PREDNISONE 10 MG PO TABS: 40.0000 mg | ORAL_TABLET | Freq: Every day | ORAL | 0 refills | Status: DC

## 2019-11-25 MED ORDER — OXYCODONE-ACETAMINOPHEN 5-325 MG PO TABS: 1.0000 | ORAL_TABLET | Freq: Four times a day (QID) | ORAL | 0 refills | Status: AC | PRN

## 2019-11-25 MED ORDER — IOHEXOL 300 MG/ML  SOLN
100.0000 mL | Freq: Once | INTRAMUSCULAR | Status: AC | PRN
  Administered 2019-11-25: 100 mL via INTRAVENOUS

## 2019-11-25 MED ORDER — DICYCLOMINE HCL 10 MG/ML IM SOLN
20.0000 mg | Freq: Once | INTRAMUSCULAR | Status: AC
  Administered 2019-11-25: 20 mg via INTRAMUSCULAR
  Filled 2019-11-25: qty 2

## 2019-11-25 NOTE — ED Notes (Signed)
ED Provider at bedside. 

## 2019-11-25 NOTE — Telephone Encounter (Signed)
I saw these messages after finishing my procedures today, I called [facility] and spoke with the provider caring for Marissa Morales. Marissa Morales's vital signs are normal, she apparently has a benign abdominal exam, her hemoglobin is normal, and a CT scan shows proctitis as expected without complications.  They spoke to the PA from our inpatient consult service and the decision was made to admit Marissa Morales to the hospital.  After reviewing all this, and speaking with Marissa Morales on the phone, I feel she can be discharged home with pain medicine and steroids.  I sent prescriptions for prednisone and Percocet to her local pharmacy that she knows will be open late enough this evening to pick it up.  She will also get a dose of 40 mg of Solu-Medrol IV in the ED prior to discharge.  I asked Marissa Morales to call our office next Monday with an update on symptoms, or certainly sooner if necessary to speak with clinic nursing( and subsequent message to physician) during regular business hours, or on-call physician after hours.  - HD

## 2019-11-25 NOTE — Telephone Encounter (Signed)
Patient calling to follow up on her MyChart message from last night. She is highly concerned and is seeking advise on what she can do. She is in a lot of pain when she goes to the bathroom and she is also bleeding

## 2019-11-25 NOTE — Discharge Instructions (Signed)
Take steroids as directed by your GI doctor, you can use your home hyoscyamine to help with pain, and Zofran as needed for nausea.  If you have worsening symptoms call your GI doctor or return to the ED.

## 2019-11-25 NOTE — Telephone Encounter (Signed)
Patient currently at [facility] ED for evaluation.

## 2019-11-25 NOTE — ED Provider Notes (Signed)
[facility] EMERGENCY DEPARTMENT Provider Note   CSN: [id number] Arrival date & time: 11/25/19  1316     History Chief Complaint  Patient presents with  . Rectal Bleeding    Marissa Morales is a 24 y.o. female.  Marissa Morales is a 24 y.o. female with a history of ulcerative colitis, GERD, diabetes, depression and anxiety, who presents to the emergency department for evaluation of rectal bleeding.  Patient states since 10 PM last night she has had increased rectal bleeding, more than she has ever had before with her UC.  She states that she went to the bathroom to urinate and noticed that she was leaking bright red blood from her rectum, even when she was not attempting to have a bowel movement.  When she did go to have a bowel movement she states she passed a large amount of bright red blood without clots.  She also reports worsening rectal pain and left lower quadrant abdominal pain.  She states that she has always had left lower quadrant pain associated with her UC but this is much more severe than ever before.  Pain has been worsening since Sunday.  She states that on Monday she received her first Entyvio infusion, and is also on mesalamine. Followed by Dr. Loletha Carrow with [name]        Past Medical History:  Diagnosis Date  . Allergy    cats  . Anemia   . Anxiety    panic attacks  . Depression   . Diabetes mellitus without complication ([place])   . GERD (gastroesophageal reflux disease)   . Hemorrhoids   . History of asthma    childhood  . IBS (irritable bowel syndrome)   . Pilonidal cyst   . Wears glasses     Patient Active Problem List   Diagnosis Date Noted  . Rectal bleeding 11/25/2019    Past Surgical History:  Procedure Laterality Date  . COLONOSCOPY WITH ESOPHAGOGASTRODUODENOSCOPY (EGD)  x2  last one 2015  . PILONIDAL CYST EXCISION N/A 07/18/2017   Procedure: CYST EXCISION PILONIDAL;  Surgeon: Clovis Riley, MD;  Location: [facility];   Service: General;  Laterality: N/A;  . WISDOM TOOTH EXTRACTION  2015     OB History   No obstetric history on file.     Family History  Problem Relation Age of Onset  . Irritable bowel syndrome Mother   . Endometriosis Mother   . Colon cancer Mother   . Hypertension Father   . Colon polyps Father   . Endometriosis Sister   . Irritable bowel syndrome Sister   . Diverticulitis Sister   . Breast cancer Paternal Grandfather   . Heart attack Maternal Grandmother   . Liver cancer Neg Hx   . Rectal cancer Neg Hx   . Pancreatic cancer Neg Hx   . Stomach cancer Neg Hx   . Esophageal cancer Neg Hx     Social History   Tobacco Use  . Smoking status: Former Smoker    Packs/day: 0.50    Years: 5.00    Pack years: 2.50    Types: Cigarettes    Quit date: 05/15/2017    Years since quitting: 2.5  . Smokeless tobacco: Never Used  . Tobacco comment: 07-15-2017  per pt quit smoking cig. 05-15-2017 but occasionally vapes  Vaping Use  . Vaping Use: Former  Substance Use Topics  . Alcohol use: Not Currently    Comment: occasional  . Drug use: No  Home Medications Prior to Admission medications   Medication Sig Start Date End Date Taking? Authorizing Provider  alum & mag hydroxide-simeth (MAALOX/MYLANTA) 200-200-20 MG/5ML suspension Take by mouth every 6 (six) hours as needed for indigestion or heartburn.    [provider]  B Complex Vitamins (VITAMIN B COMPLEX PO) Take 1 Dose by mouth daily. Liquid complex    [provider]  BIOTIN PO Take by mouth.    [provider]  Calcium Carb-Cholecalciferol (CALCIUM/VITAMIN D PO) Take 1 tablet by mouth daily.    [provider]  GLUCOSAMINE-CHONDROITIN PO Take by mouth.    [provider]  hyoscyamine (LEVSIN SL) 0.125 MG SL tablet Place 1 tablet (0.125 mg total) under the tongue every 6 (six) hours as needed. 10/29/19   Doran Stabler, MD  Mesalamine (ASACOL HD) 800 MG TBEC Take 2 tablets  (1,600 mg total) by mouth 2 (two) times daily. 08/10/19   Doran Stabler, MD  metoCLOPramide (REGLAN) 5 MG tablet Take 1 tablet (5 mg total) by mouth every 12 (twelve) hours as needed for up to 2 doses for nausea. Take 30-45 minutes before evening and AM doses of bowel preparation solution. 10/06/19   Doran Stabler, MD  Multiple Vitamin (MULTIVITAMIN) tablet Take 1 tablet by mouth daily.    [provider]  omeprazole (PRILOSEC) 40 MG capsule TAKE ONE CAPSULE BY MOUTH EVERY DAY 11/03/19   Doran Stabler, MD  ondansetron (ZOFRAN ODT) 4 MG disintegrating tablet 4mg  ODT q4 hours prn nausea/vomit 11/25/19   Jacqlyn Larsen, PA-C  oxyCODONE-acetaminophen (PERCOCET) 5-325 MG tablet Take 1 tablet by mouth every 6 (six) hours as needed for up to 5 days for severe pain. 11/25/19 11/30/19  Doran Stabler, MD  predniSONE (DELTASONE) 10 MG tablet Take 4 tablets (40 mg total) by mouth daily with breakfast. 4 tablets daily for 10 days, 3 tablets daily for 10 days, 2 tablets daily for 7 days, 10 tablets daily for 7 days, then stop 11/25/19   Doran Stabler, MD  Zinc Sulfate (ZINC 15 PO) Take by mouth every morning.    [provider]    Allergies    Bactrim [sulfamethoxazole-trimethoprim], Bismuth-containing compounds, Doxycycline, Vancomycin, and Vantin [cefpodoxime]  Review of Systems   Review of Systems  Constitutional: Negative for chills and fever.  HENT: Negative.   Respiratory: Negative for cough and shortness of breath.   Cardiovascular: Negative for chest pain.  Gastrointestinal: Positive for abdominal pain, anal bleeding, blood in stool and nausea. Negative for vomiting.  Genitourinary: Negative for dysuria and frequency.  Musculoskeletal: Negative for arthralgias and myalgias.  Skin: Negative for color change and rash.  Neurological: Negative for dizziness, syncope and light-headedness.    Physical Exam Updated Vital Signs BP (!) 134/99 (BP Location: Left Arm)    Pulse 88   Temp 98.7 F (37.1 C) (Oral)   Resp 14   Ht 5\' 1"  (1.549 m)   Wt 68 kg   SpO2 100%   BMI 28.34 kg/m   Physical Exam Vitals and nursing note reviewed.  Constitutional:      General: She is not in acute distress.    Appearance: Normal appearance. She is well-developed and normal weight. She is not ill-appearing or diaphoretic.  HENT:     Head: Normocephalic and atraumatic.     Mouth/Throat:     Mouth: Mucous membranes are moist.     Pharynx: Oropharynx is clear.  Eyes:     General:        Right eye: No discharge.        Left eye: No discharge.  Cardiovascular:     Rate and Rhythm: Normal rate and regular rhythm.     Pulses: Normal pulses.     Heart sounds: Normal heart sounds. No murmur heard.  No friction rub. No gallop.   Pulmonary:     Effort: Pulmonary effort is normal. No respiratory distress.     Breath sounds: Normal breath sounds. No wheezing or rales.     Comments: Respirations equal and unlabored, patient able to speak in full sentences, lungs clear to auscultation bilaterally Abdominal:     General: Bowel sounds are normal. There is no distension.     Palpations: Abdomen is soft. There is no mass.     Tenderness: There is abdominal tenderness. There is no guarding.     Comments: Abdomen is soft, nondistended, bowel sounds present throughout, there is some mild pain in the left lower quadrant, no peritoneal signs or guarding.  Genitourinary:    Comments: Rectal exam without gross bleeding, hemorrhoids or fissures noted, normal rectal tone, patient did have some discomfort and tenderness with rectal exam Musculoskeletal:        General: No deformity.     Cervical back: Neck supple.  Skin:    General: Skin is warm and dry.     Capillary Refill: Capillary refill takes less than 2 seconds.  Neurological:     Mental Status: She is alert.     Coordination: Coordination normal.     Comments: Speech is clear, able to follow commands Moves extremities  without ataxia, coordination intact  Psychiatric:        Mood and Affect: Mood normal.        Behavior: Behavior normal.     ED Results / Procedures / Treatments   Labs (all labs ordered are listed, but only abnormal results are displayed) Labs Reviewed  COMPREHENSIVE METABOLIC PANEL - Abnormal; Notable for the following components:      Result Value   Potassium 3.4 (*)    Glucose, Bld 108 (*)    All other components within normal limits  CBC - Abnormal; Notable for the following components:   WBC 10.6 (*)    Platelets 426 (*)    All other components within normal limits  URINALYSIS, ROUTINE W REFLEX MICROSCOPIC - Abnormal; Notable for the following components:   Leukocytes,Ua SMALL (*)    All other components within normal limits  URINALYSIS, MICROSCOPIC (REFLEX) - Abnormal; Notable for the following components:   Bacteria, UA FEW (*)    All other components within normal limits  RESPIRATORY PANEL BY RT PCR (FLU A&B, COVID)  PREGNANCY, URINE    EKG None  Radiology CT ABDOMEN PELVIS W CONTRAST  Result Date: 11/25/2019 CLINICAL DATA:  History of ulcerative colitis with rectal bleeding EXAM: CT ABDOMEN AND PELVIS WITH CONTRAST TECHNIQUE: Multidetector CT imaging of the abdomen and pelvis was performed using the standard protocol following bolus administration of intravenous contrast. CONTRAST:  160mL OMNIPAQUE IOHEXOL 300 MG/ML  SOLN COMPARISON:  None. FINDINGS: Lower chest: No acute abnormality. Hepatobiliary: No focal liver abnormality is seen. No gallstones, gallbladder wall thickening, or biliary dilatation. Pancreas: Unremarkable. No pancreatic ductal dilatation or surrounding inflammatory changes. Spleen: Upper normal in size Adrenals/Urinary Tract: Adrenal glands are unremarkable. Kidneys are normal, without renal calculi, focal lesion, or hydronephrosis. Bladder is unremarkable. Stomach/Bowel: Stomach within normal limits.  No dilated small bowel. Negative appendix. Mild wall  thickening and surrounding inflammatory change involving the rectosigmoid colon. Vascular/Lymphatic: Nonaneurysmal aorta. Right lower quadrant mesenteric nodes measuring up to 1 cm. Reproductive: Uterus unremarkable. 13 mm hyperdense focus in the right ovary. Other: Trace free fluid. No free air. Small fat containing umbilical hernia. Musculoskeletal: No acute or significant osseous findings. IMPRESSION: 1. Mild wall thickening and surrounding inflammatory change involving the rectosigmoid colon consistent with distal colitis and presumably related to history of inflammatory bowel disease. Trace free fluid. Negative for free air. 2. 13 mm hyperdense focus in the right ovary, question hemorrhagic cyst, correlation with nonemergent ultrasound could be considered. Electronically Signed   By: Donavan Foil M.D.   On: 11/25/2019 16:21    Procedures Procedures (including critical care time)  Medications Ordered in ED Medications  0.9 %  sodium chloride infusion (has no administration in time range)  sodium chloride 0.9 % bolus 1,000 mL ( Intravenous Stopped 11/25/19 1716)  dicyclomine (BENTYL) injection 20 mg (20 mg Intramuscular Given 11/25/19 1542)  ondansetron (ZOFRAN) injection 4 mg (4 mg Intravenous Given 11/25/19 1542)  iohexol (OMNIPAQUE) 300 MG/ML solution 100 mL (100 mLs Intravenous Contrast Given 11/25/19 1553)  methylPREDNISolone sodium succinate (SOLU-MEDROL) 40 mg/mL injection 40 mg (40 mg Intravenous Given 11/25/19 1717)    ED Course  I have reviewed the triage vital signs and the nursing notes.  Pertinent labs & imaging results that were available during my care of the patient were reviewed by me and considered in my medical decision making (see chart for details).    MDM Rules/Calculators/A&P                         24 year old female with ulcerative colitis presenting with worsening rectal bleeding and lower abdominal pain.  States that last night and today she has had large amounts of  rectal bleeding that she has ever had with her ulcerative colitis before, and reports worsening left lower quadrant pain.  Recently was started on Entyvio by her GI doctor.  Followed by Dr. Loletha Carrow with GI.  On exam patient with some left lower quadrant pain, no current active rectal bleeding.  Labs obtained from triage which show stable hemoglobin of 12.9, mild leukocytosis of 10.6, potassium of 3.4 but no other significant electrolyte derangements, normal renal and liver function.  Urinalysis with small leuks, but no significant signs of infection and pregnancy negative.  Given worsening bleeding, will discuss with GI for further treatment recommendations.  Case discussed with PA Azucena Freed with [name], who recommends admission to medicine service and the GI team will consult on her there, does recommend CT since patient has not had recent imaging.  Would like to hold off on any medications at this point until CT returns.  Discussed with Dr. Antonieta Pert with [facility] who accepts the patient for transfer and admission.  While patient was still in the department pending admission received call from Dr. Loletha Carrow, patient's GI doctor who recommended starting patient on IV Solu-Medrol.  Dr. Loletha Carrow called back a second time after speaking with the patient on the phone and feels that the patient does not need to be admitted, still wants her to receive IV Solu-Medrol, but then will prescribe steroids for her to take at home and feels that this can be managed at home rather than in the hospital.  Patient is comfortable with this plan.  He has called in prednisone with instructions for  patient and I have prescribed her Zofran to help with nausea.  She is discharged home at this time.  I informed bed placement admitting provider change in disposition.   Final Clinical Impression(s) / ED Diagnoses Final diagnoses:  Ulcerative colitis with rectal bleeding, unspecified location [facility])    Rx / DC Orders  ED Discharge Orders         Ordered    ondansetron (ZOFRAN ODT) 4 MG disintegrating tablet        11/25/19 1809           Jacqlyn Larsen, PA-C 11/25/19 2323    Lajean Saver, MD 11/26/19 [phone]

## 2019-11-25 NOTE — Telephone Encounter (Signed)
Pt is checking on the status of message, would like a call back ASAP

## 2019-11-25 NOTE — ED Triage Notes (Signed)
Pt with hx of ulcerative colitis is here for increased rectal bleeding which began last pm around 10pm.  Pt states that the blood was running out and dripping when she sat on the toilet.

## 2019-11-25 NOTE — Telephone Encounter (Signed)
Spoke with patient regarding below, she is aware that we received her patient message via My Chart and it has been sent to Dr. Loletha Carrow as high priority for review and recommendations. I made patient aware that Dr. Loletha Carrow is performing procedures today and will get back with her as soon as he can. Pt verbalized understanding.

## 2019-11-26 ENCOUNTER — Ambulatory Visit (INDEPENDENT_AMBULATORY_CARE_PROVIDER_SITE_OTHER): Payer: 59 | Admitting: Gastroenterology

## 2019-11-26 DIAGNOSIS — Z23 Encounter for immunization: Secondary | ICD-10-CM | POA: Diagnosis not present

## 2019-11-26 DIAGNOSIS — K51211 Ulcerative (chronic) proctitis with rectal bleeding: Secondary | ICD-10-CM

## 2019-11-26 NOTE — Telephone Encounter (Signed)
Noted, thank you

## 2019-12-28 ENCOUNTER — Other Ambulatory Visit: Payer: Self-pay

## 2019-12-28 MED ORDER — HYOSCYAMINE SULFATE 0.125 MG SL SUBL: 0.1250 mg | SUBLINGUAL_TABLET | Freq: Four times a day (QID) | SUBLINGUAL | 1 refills | Status: DC | PRN

## 2019-12-30 ENCOUNTER — Other Ambulatory Visit (INDEPENDENT_AMBULATORY_CARE_PROVIDER_SITE_OTHER): Payer: 59

## 2019-12-30 ENCOUNTER — Ambulatory Visit (INDEPENDENT_AMBULATORY_CARE_PROVIDER_SITE_OTHER): Payer: 59 | Admitting: Gastroenterology

## 2019-12-30 ENCOUNTER — Encounter: Payer: Self-pay | Admitting: Gastroenterology

## 2019-12-30 VITALS — BP 118/80 | HR 77 | Ht 61.0 in | Wt 153.0 lb

## 2019-12-30 DIAGNOSIS — K921 Melena: Secondary | ICD-10-CM

## 2019-12-30 DIAGNOSIS — K51211 Ulcerative (chronic) proctitis with rectal bleeding: Secondary | ICD-10-CM | POA: Diagnosis not present

## 2019-12-30 DIAGNOSIS — R635 Abnormal weight gain: Secondary | ICD-10-CM

## 2019-12-30 DIAGNOSIS — R140 Abdominal distension (gaseous): Secondary | ICD-10-CM

## 2019-12-30 DIAGNOSIS — R1030 Lower abdominal pain, unspecified: Secondary | ICD-10-CM

## 2019-12-30 LAB — CBC WITH DIFFERENTIAL/PLATELET
Basophils Absolute: 0.1 10*3/uL (ref 0.0–0.1)
Basophils Relative: 0.4 % (ref 0.0–3.0)
Eosinophils Absolute: 0.1 10*3/uL (ref 0.0–0.7)
Eosinophils Relative: 0.4 % (ref 0.0–5.0)
HCT: 40.7 % (ref 36.0–46.0)
Hemoglobin: 13.3 g/dL (ref 12.0–15.0)
Lymphocytes Relative: 20.3 % (ref 12.0–46.0)
Lymphs Abs: 3 10*3/uL (ref 0.7–4.0)
MCHC: 32.8 g/dL (ref 30.0–36.0)
MCV: 81.7 fl (ref 78.0–100.0)
Monocytes Absolute: 1.3 10*3/uL — ABNORMAL HIGH (ref 0.1–1.0)
Monocytes Relative: 8.8 % (ref 3.0–12.0)
Neutro Abs: 10.3 10*3/uL — ABNORMAL HIGH (ref 1.4–7.7)
Neutrophils Relative %: 70.1 % (ref 43.0–77.0)
Platelets: 483 10*3/uL — ABNORMAL HIGH (ref 150.0–400.0)
RBC: 4.98 Mil/uL (ref 3.87–5.11)
RDW: 14.8 % (ref 11.5–15.5)
WBC: 14.6 10*3/uL — ABNORMAL HIGH (ref 4.0–10.5)

## 2019-12-30 LAB — IBC + FERRITIN
Ferritin: 11 ng/mL (ref 10.0–291.0)
Iron: 56 ug/dL (ref 42–145)
Saturation Ratios: 11.9 % — ABNORMAL LOW (ref 20.0–50.0)
Transferrin: 336 mg/dL (ref 212.0–360.0)

## 2019-12-30 LAB — TSH: TSH: 1.55 u[IU]/mL (ref 0.35–4.50)

## 2019-12-30 NOTE — Patient Instructions (Signed)
If you are age 25 or older, your body mass index should be between 23-30. Your Body mass index is 28.91 kg/m. If this is out of the aforementioned range listed, please consider follow up with your Primary Care Provider.  If you are age 51 or younger, your body mass index should be between 19-25. Your Body mass index is 28.91 kg/m. If this is out of the aformentioned range listed, please consider follow up with your Primary Care Provider.   Your provider has requested that you go to the basement level for lab work before leaving today. Press "B" on the elevator. The lab is located at the first door on the left as you exit the elevator.  Due to recent changes in healthcare laws, you may see the results of your imaging and laboratory studies on MyChart before your provider has had a chance to review them.  We understand that in some cases there may be results that are confusing or concerning to you. Not all laboratory results come back in the same time frame and the provider may be waiting for multiple results in order to interpret others.  Please give [place] 48 hours in order for your provider to thoroughly review all the results before contacting the office for clarification of your results.   It was a pleasure to see you today!  Dr. Loletha Carrow
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
## 2020-01-03 ENCOUNTER — Other Ambulatory Visit: Payer: Self-pay | Admitting: Gastroenterology

## 2020-01-08 ENCOUNTER — Telehealth: Payer: Self-pay | Admitting: Internal Medicine

## 2020-01-08 NOTE — Telephone Encounter (Signed)
GI ON CALL NOTE  The patient called this evening complaining of rectal bleeding.  She had "blood dripping from her rectum a few times today".  She had 2 formed bowel movements which were normal-looking.  She has a history of ulcerative proctitis.  She saw Dr. Loletha Carrow 9 days ago.  I reviewed his office note.  She also complains of nausea.  Medications at home include omeprazole, Zofran, hyoscyamine, and just completed prednisone.  She also had Entyvio 2 days ago.  She was quite anxious and worried.  She has recently started Zoloft for anxiety.  She wondered if she needed to go to the hospital.  I provided reassurance and recommended observation.  If her problem persists or worsens, I recommended that she contact the office for additional recommendations (possibly topical therapy).  I will forward this note to Dr. Loletha Carrow.

## 2020-01-10 ENCOUNTER — Other Ambulatory Visit: Payer: Self-pay | Admitting: Gastroenterology

## 2020-01-10 NOTE — Telephone Encounter (Signed)
Thanks for the note.  She and I communicated by portal message today.  - HD

## 2020-01-25 ENCOUNTER — Other Ambulatory Visit: Payer: Self-pay

## 2020-01-25 DIAGNOSIS — R197 Diarrhea, unspecified: Secondary | ICD-10-CM

## 2020-01-25 NOTE — Telephone Encounter (Signed)
Spoke with patient in regarss to Dr. Loletha Carrow' recommendations. Advised that I have ordered the stool study, she will need to go by the lab at her convenience between 7:30 AM - 5 PM, Monday through Friday. She is aware that no appointment is necessary. Advised that once she picks up the kit they will instruct her on how to collect the specimen and return it. Advised to take Imodium as needed but to be very cautious with dosing as she tends to have constipation. Patient verbalized understanding of all information and had no other concerns at the end of the call

## 2020-01-25 NOTE — Telephone Encounter (Signed)
Looks like Marissa Morales sent this message over the weekend, so I just saw it late yesterday and had not had a chance to respond.  Please call her with my advice because portal messaging not ideal for more urgent issues.  This diarrhea is unusual for her, even with proctitis (she tends more toward constipation), so I wonder if she has some kind of infection.  Please arrange a GI pathogen panel.  As needed imodium but cautious with dosing because she tends toward constipation.

## 2020-01-25 NOTE — Telephone Encounter (Signed)
Error - duplicate

## 2020-01-25 NOTE — Telephone Encounter (Signed)
Lm on vm for patient to return call.   Lab order in epic.

## 2020-01-26 ENCOUNTER — Other Ambulatory Visit: Payer: 59

## 2020-01-28 ENCOUNTER — Other Ambulatory Visit: Payer: 59

## 2020-01-28 DIAGNOSIS — R197 Diarrhea, unspecified: Secondary | ICD-10-CM

## 2020-02-01 LAB — GI PROFILE, STOOL, PCR

## 2020-02-14 ENCOUNTER — Telehealth: Payer: Self-pay | Admitting: *Deleted

## 2020-02-14 NOTE — Telephone Encounter (Signed)
-----   Message from Doran Stabler, MD sent at 02/10/2020  5:33 PM EST ----- Regarding: follow up Marissa Morales,    This proctitis patient needs follow up with me by mid January. Ok to open up a 240 Pm slot to work her in.   Thanks  - HD

## 2020-02-14 NOTE — Telephone Encounter (Signed)
Patient has been scheduled for office visit on 02/29/20 at 1:20 pm. She has been advised via MyChart as she and nursing staff were conversing about an appointment.

## 2020-02-29 ENCOUNTER — Ambulatory Visit (INDEPENDENT_AMBULATORY_CARE_PROVIDER_SITE_OTHER): Payer: 59 | Admitting: Gastroenterology

## 2020-02-29 ENCOUNTER — Encounter: Payer: Self-pay | Admitting: Gastroenterology

## 2020-02-29 VITALS — BP 110/80 | HR 81 | Ht 61.0 in | Wt 157.0 lb

## 2020-02-29 DIAGNOSIS — R197 Diarrhea, unspecified: Secondary | ICD-10-CM | POA: Diagnosis not present

## 2020-02-29 DIAGNOSIS — R1030 Lower abdominal pain, unspecified: Secondary | ICD-10-CM | POA: Diagnosis not present

## 2020-02-29 DIAGNOSIS — K51211 Ulcerative (chronic) proctitis with rectal bleeding: Secondary | ICD-10-CM

## 2020-02-29 MED ORDER — PROCHLORPERAZINE MALEATE 10 MG PO TABS: 10.0000 mg | ORAL_TABLET | Freq: Three times a day (TID) | ORAL | 0 refills | Status: DC | PRN

## 2020-02-29 NOTE — Progress Notes (Signed)
[facility] Progress Note  Chief Complaint: Ulcerative proctitis  Subjective  History: Marissa Morales in late October '21, and at the end of this week is due to get her first every 8-week treatment. She is still bothered by feelings of urgency, sometimes followed by just passage of a small amount of bloody mucus.  At most 2 or 3 BMs per day.  Has lower abdominal pain, not much improvement from Levsin.  Anxiety and panic attacks much improved since starting Zoloft with primary care. Intermittent nausea, takes ondansetron but cannot take it within a couple hours of Zofran dose because it makes her dizzy.  ROS: Cardiovascular:  no chest pain Respiratory: no dyspnea.  She has had a chronic dry cough for about a month, 2 rounds of antibiotics and had a negative COVID test at work earlier today.  Primary care put on antibiotics this week.  Remainder of systems negative except as above The patient's Past Medical, Family and Social History were reviewed and are on file in the EMR.  Objective:  Med list reviewed  Current Outpatient Medications:  .  amoxicillin-clavulanate (AUGMENTIN) 875-125 MG tablet, Take by mouth., Disp: , Rfl:  .  B Complex Vitamins (VITAMIN B COMPLEX PO), Take 1 Dose by mouth daily. Liquid complex, Disp: , Rfl:  .  BIOTIN PO, Take by mouth., Disp: , Rfl:  .  Calcium Carb-Cholecalciferol (CALCIUM/VITAMIN D PO), Take 1 tablet by mouth daily., Disp: , Rfl:  .  hyoscyamine (LEVSIN SL) 0.125 MG SL tablet, Place 1 tablet (0.125 mg total) under the tongue every 6 (six) hours as needed., Disp: 45 tablet, Rfl: 1 .  Multiple Vitamin (MULTIVITAMIN) tablet, Take 1 tablet by mouth daily., Disp: , Rfl:  .  omeprazole (PRILOSEC) 40 MG capsule, TAKE ONE CAPSULE BY MOUTH EVERY DAY, Disp: 30 capsule, Rfl: 1 .  ondansetron (ZOFRAN ODT) 4 MG disintegrating tablet, 57m ODT q4 hours prn nausea/vomit, Disp: 10 tablet, Rfl: 0 .  Zinc Sulfate (ZINC 15 PO), Take by mouth every morning.,  Disp: , Rfl:    Vital signs in last 24 hrs: Vitals:   02/29/20 1321  BP: 110/80  Pulse: 81  SpO2: 98%   Wt Readings from Last 3 Encounters:  02/29/20 157 lb (71.2 kg)  12/30/19 153 lb (69.4 kg)  11/25/19 150 lb (68 kg)    Physical Exam  Dry cough, wearing N95 mask  HEENT: sclera anicteric, oral mucosa moist without lesions  Neck: supple, no thyromegaly, JVD or lymphadenopathy  Cardiac: RRR without murmurs, S1S2 heard, no peripheral edema  Pulm: clear to auscultation bilaterally, normal RR and effort noted  Abdomen: soft, no tenderness, with active bowel sounds. No guarding or palpable hepatosplenomegaly.  Labs:   ___________________________________________ Radiologic studies:   ____________________________________________ Other:   _____________________________________________ Assessment & Plan  Assessment: Encounter Diagnoses  Name Primary?  . 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 Diarrhea, unspecified type   . Lower abdominal pain    Proctitis not appreciably improved on follow-up sigmoidoscopy, which is why we began Entyvio.  Symptoms reportedly improved in first few weeks but plateaued afterwards.  Still has pain and bloody mucus.  I still think there may be some functional element to that.  Might even be some pelvic dyssynergia or from years of proctitis prior to diagnosis and treatment.   Plan: She can have Entyvio as scheduled later this week, even though she is on antibiotics.  Sigmoidoscopy in 3 to 4 weeks.  If no  appreciable improvement from last exam before Entyvio was started, we will switch to Humira.  30 minutes were spent on this encounter (including chart review, history/exam, counseling/coordination of care, and documentation) > 50% of that time was spent on counseling and coordination of care.  Topics discussed included: Recap of options for biologic therapy.  [name]

## 2020-02-29 NOTE — Patient Instructions (Signed)
If you are age 26 or older, your body mass index should be between 23-30. Your Body mass index is 29.66 kg/m. If this is out of the aforementioned range listed, please consider follow up with your Primary Care Provider.  If you are age 66 or younger, your body mass index should be between 19-25. Your Body mass index is 29.66 kg/m. If this is out of the aformentioned range listed, please consider follow up with your Primary Care Provider.   You have been scheduled for a flexible sigmoidoscopy. Please follow the written instructions given to you at your visit today. If you use inhalers (even only as needed), please bring them with you on the day of your procedure.  It was a pleasure to see you today!  Dr. Loletha Carrow

## 2020-03-13 ENCOUNTER — Other Ambulatory Visit: Payer: Self-pay | Admitting: Gastroenterology

## 2020-03-13 ENCOUNTER — Other Ambulatory Visit: Payer: Self-pay

## 2020-03-14 ENCOUNTER — Telehealth: Payer: Self-pay | Admitting: Gastroenterology

## 2020-03-14 NOTE — Telephone Encounter (Signed)
Spoke with patient, she states that she has had pain for a few days, she reports a "split" on the skin above her rectum, does not thinks it is bleeding, states that she has been using Desitin and lidocaine cream to help with the discomfort. Advised that the area will need to be assessed. Patient has been scheduled for a follow up on Wednesday, 03/15/20 at 10:40 AM. Patient had no concerns at the end of the call. Vivien Rota, CMA is aware that patient has been added.

## 2020-03-15 ENCOUNTER — Encounter: Payer: Self-pay | Admitting: Gastroenterology

## 2020-03-15 ENCOUNTER — Ambulatory Visit (INDEPENDENT_AMBULATORY_CARE_PROVIDER_SITE_OTHER): Payer: 59 | Admitting: Gastroenterology

## 2020-03-15 ENCOUNTER — Other Ambulatory Visit: Payer: Self-pay | Admitting: Gastroenterology

## 2020-03-15 VITALS — BP 124/86 | HR 82 | Ht 61.0 in | Wt 157.0 lb

## 2020-03-15 DIAGNOSIS — K629 Disease of anus and rectum, unspecified: Secondary | ICD-10-CM | POA: Diagnosis not present

## 2020-03-15 NOTE — Progress Notes (Signed)
[facility] Progress Note  Chief Complaint: Perianal skin lesion (tear) with pain  Subjective  History:  I recently saw Nakira for her proctitis, and she has an upcoming sigmoidoscopy. She requested evaluation for several days of a painful skin lesion in the gluteal cleft.  She recalls being a similar area where she had problems after surgery for a pilonidal cyst a few years back.  There is been no bleeding or drainage from the area.  It is painful to sit and she has not gotten much relief putting Desitin ointment on it. She is still bothered by constipation, where she feels the need to sit for 15 to 20 minutes before having much bowel movement.  Then she might have a few bowel movements in a day.  She has intermittent scant blood or mucus in the stool as before, which is the reason for sigmoidoscopy soon to assess the status of her proctitis. Her sister was present with her for the entire visit today. ROS: Cardiovascular:  no chest pain Respiratory: no dyspnea  The patient's Past Medical, Family and Social History were reviewed and are on file in the EMR.  Objective:  Med list reviewed  Current Outpatient Medications:  .  B Complex Vitamins (VITAMIN B COMPLEX PO), Take 1 Dose by mouth daily. Liquid complex, Disp: , Rfl:  .  BIOTIN PO, Take by mouth., Disp: , Rfl:  .  Calcium Carb-Cholecalciferol (CALCIUM/VITAMIN D PO), Take 1 tablet by mouth daily., Disp: , Rfl:  .  hyoscyamine (LEVSIN SL) 0.125 MG SL tablet, Place 1 tablet (0.125 mg total) under the tongue every 6 (six) hours as needed., Disp: 45 tablet, Rfl: 1 .  Multiple Vitamin (MULTIVITAMIN) tablet, Take 1 tablet by mouth daily., Disp: , Rfl:  .  omeprazole (PRILOSEC) 40 MG capsule, TAKE ONE CAPSULE BY MOUTH EVERY DAY, Disp: 30 capsule, Rfl: 1 .  ondansetron (ZOFRAN ODT) 4 MG disintegrating tablet, 56m ODT q4 hours prn nausea/vomit, Disp: 10 tablet, Rfl: 0 .  prochlorperazine (COMPAZINE) 10 MG tablet, Take 1 tablet (10  mg total) by mouth every 8 (eight) hours as needed for nausea or vomiting., Disp: 20 tablet, Rfl: 0 .  Zinc Sulfate (ZINC 15 PO), Take by mouth every morning., Disp: , Rfl:    Vital signs in last 24 hrs: Vitals:   03/15/20 1054  BP: 124/86  Pulse: 82   Wt Readings from Last 3 Encounters:  03/15/20 157 lb (71.2 kg)  02/29/20 157 lb (71.2 kg)  12/30/19 153 lb (69.4 kg)    Physical Exam CMA June present for entire exam. SCaliseis visibly uncomfortable sitting  Abdomen: soft, no tenderness, with active bowel sounds. No guarding or palpable hepatosplenomegaly.  Skin: Small split in the skin, inferior aspect gluteal cleft,, tender, no surrounding inflammation, fluctuance or evidence of infection.  Not contiguous with the posterior anal canal and no anal fissure was seen.  Labs:   ___________________________________________ Radiologic studies:   ____________________________________________ Other:   _____________________________________________ Assessment & Plan  Assessment: Encounter Diagnosis  Name Primary?  . Perianal lesion Yes   Skin tear, she reports excess wiping lately trying to get clean after bowel movements.  She is cause some skin trauma that is painful.  No evidence of fistula or open lesion causing any drainage or infection.  I applied some OTC RectiCare ointment, and gave her some samples of that.  I recommended applying that followed by some Desitin 3 times a day and as needed. I expect this  will heal up on its own.  She should avoid sitting as much as possible if that worsens the pain.  Sigmoidoscopy as scheduled next month    [name]

## 2020-03-15 NOTE — Patient Instructions (Signed)
If you are age 26 or older, your body mass index should be between 23-30. Your Body mass index is 29.66 kg/m. If this is out of the aforementioned range listed, please consider follow up with your Primary Care Provider.  If you are age 66 or younger, your body mass index should be between 19-25. Your Body mass index is 29.66 kg/m. If this is out of the aformentioned range listed, please consider follow up with your Primary Care Provider.   It was a pleasure to see you today!  Dr. Loletha Carrow

## 2020-03-20 ENCOUNTER — Encounter: Payer: Self-pay | Admitting: Gastroenterology

## 2020-03-23 ENCOUNTER — Other Ambulatory Visit: Payer: Self-pay | Admitting: Gastroenterology

## 2020-03-30 ENCOUNTER — Ambulatory Visit: Payer: 59 | Admitting: Gastroenterology

## 2020-03-30 HISTORY — PX: KNEE ARTHROSCOPY: SHX127

## 2020-03-31 ENCOUNTER — Encounter: Payer: Self-pay | Admitting: Gastroenterology

## 2020-04-10 ENCOUNTER — Telehealth: Payer: Self-pay

## 2020-04-10 ENCOUNTER — Other Ambulatory Visit: Payer: Self-pay

## 2020-04-10 ENCOUNTER — Ambulatory Visit (AMBULATORY_SURGERY_CENTER): Payer: 59 | Admitting: Gastroenterology

## 2020-04-10 ENCOUNTER — Encounter: Payer: Self-pay | Admitting: Gastroenterology

## 2020-04-10 VITALS — BP 127/72 | HR 72 | Temp 97.5°F | Resp 20 | Ht 61.0 in | Wt 157.0 lb

## 2020-04-10 DIAGNOSIS — K51211 Ulcerative (chronic) proctitis with rectal bleeding: Secondary | ICD-10-CM

## 2020-04-10 DIAGNOSIS — K5909 Other constipation: Secondary | ICD-10-CM

## 2020-04-10 MED ORDER — SODIUM CHLORIDE 0.9 % IV SOLN: 500.0000 mL | Freq: Once | INTRAVENOUS | Status: DC

## 2020-04-10 NOTE — Progress Notes (Signed)
Called to room to assist during endoscopic procedure.  Patient ID and intended procedure confirmed with present staff. Received instructions for my participation in the procedure from the performing physician.  

## 2020-04-10 NOTE — Op Note (Signed)
[place] Patient Name: Marissa Morales Procedure Date: 04/10/2020 10:10 AM MRN: [id number] Endoscopist: Mallie Mussel L. Loletha Carrow , MD Age: 25 Referring MD:  Date of Birth: Sep 17, 1995 Gender: Female Account #: [id number] Procedure:                Flexible Sigmoidoscopy Indications:              Rectal hemorrhage, Disease activity assessment of                            chronic ulcerative proctitis ( Dx 04/2019, last                            scope 10/2019, after which vedolizumab was started.                            persistent irregular bowels with diarrhea or                            constipation and bleeding) Medicines:                Monitored Anesthesia Care Procedure:                Pre-Anesthesia Assessment:                           - Prior to the procedure, a History and Physical                            was performed, and patient medications and                            allergies were reviewed. The patient's tolerance of                            previous anesthesia was also reviewed. The risks                            and benefits of the procedure and the sedation                            options and risks were discussed with the patient.                            All questions were answered, and informed consent                            was obtained. Prior Anticoagulants: The patient has                            taken no previous anticoagulant or antiplatelet                            agents. ASA Grade Assessment: II - A patient with  mild systemic disease. After reviewing the risks                            and benefits, the patient was deemed in                            satisfactory condition to undergo the procedure.                           After obtaining informed consent, the scope was                            passed under direct vision. The Olympus PFC-[id number]                            (#[id number]) Colonoscope was  introduced through the                            anus and advanced to the the sigmoid colon. The                            flexible sigmoidoscopy was accomplished without                            difficulty. The patient tolerated the procedure                            well. The quality of the bowel preparation was good. Scope In: Scope Out: Findings:                 The perianal and digital rectal examinations were                            normal.                           Inflammation was found as patches surrounded by                            normal mucosa in the recto-sigmoid colon. This was                            graded as Mayo Score 1 (mild, with erythema,                            decreased vascular pattern, mild friability), and                            when compared to the previous examination, the                            findings are improved. Biopsies were taken with a  cold forceps for histology.                           Notably, the current mildly active inflammation is                            about 20cm from anal verge (rectosigmoid junction),                            whereas the prior activity was in the distal rectum. Complications:            No immediate complications. Estimated Blood Loss:     Estimated blood loss was minimal. 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 Biopsied. Recommendation:           - Patient has a contact number available for                            emergencies. The signs and symptoms of potential                            delayed complications were discussed with the                            patient. Return to normal activities tomorrow.                            Written discharge instructions were provided to the                            patient.                           - Resume  previous diet.                           - Continue present medications.                           - Schedule anorectal manometry. 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 [name] L. Loletha Carrow, MD 04/10/2020 10:33:59 AM This report has been signed electronically.

## 2020-04-10 NOTE — Patient Instructions (Signed)
Impression/Recommendations:  Resume previous diet. Continue present medications.  Schedule anorectal manometry.  YOU HAD AN ENDOSCOPIC PROCEDURE TODAY AT [facility]:   Refer to the procedure report that was given to you for any specific questions about what was found during the examination.  If the procedure report does not answer your questions, please call your gastroenterologist to clarify.  If you requested that your care partner not be given the details of your procedure findings, then the procedure report has been included in a sealed envelope for you to review at your convenience later.  YOU SHOULD EXPECT: Some feelings of bloating in the abdomen. Passage of more gas than usual.  Walking can help get rid of the air that was put into your GI tract during the procedure and reduce the bloating. If you had a lower endoscopy (such as a colonoscopy or flexible sigmoidoscopy) you may notice spotting of blood in your stool or on the toilet paper. If you underwent a bowel prep for your procedure, you may not have a normal bowel movement for a few days.  Please Note:  You might notice some irritation and congestion in your nose or some drainage.  This is from the oxygen used during your procedure.  There is no need for concern and it should clear up in a day or so.  SYMPTOMS TO REPORT IMMEDIATELY:   Following lower endoscopy (colonoscopy or flexible sigmoidoscopy):  Excessive amounts of blood in the stool  Significant tenderness or worsening of abdominal pains  Swelling of the abdomen that is new, acute  Fever of 100F or higher For urgent or emergent issues, a gastroenterologist can be reached at any hour by calling [phone]. Do not use MyChart messaging for urgent concerns.    DIET:  We do recommend a small meal at first, but then you may proceed to your regular diet.  Drink plenty of fluids but you should avoid alcoholic beverages for 24 hours.  ACTIVITY:  You  should plan to take it easy for the rest of today and you should NOT DRIVE or use heavy machinery until tomorrow (because of the sedation medicines used during the test).    FOLLOW UP: Our staff will call the number listed on your records 48-72 hours following your procedure to check on you and address any questions or concerns that you may have regarding the information given to you following your procedure. If we do not reach you, we will leave a message.  We will attempt to reach you two times.  During this call, we will ask if you have developed any symptoms of COVID 19. If you develop any symptoms (ie: fever, flu-like symptoms, shortness of breath, cough etc.) before then, please call [phone].  If you test positive for Covid 19 in the 2 weeks post procedure, please call and report this information to [place].    If any biopsies were taken you will be contacted by phone or by letter within the next 1-3 weeks.  Please call us at [phone] if you have not heard about the biopsies in 3 weeks.    SIGNATURES/CONFIDENTIALITY: You and/or your care partner have signed paperwork which will be entered into your electronic medical record.  These signatures attest to the fact that that the information above on your After Visit Summary has been reviewed and is understood.  Full responsibility of the confidentiality of this discharge information lies with you and/or your care-partner.

## 2020-04-10 NOTE — Telephone Encounter (Signed)
Spoke with patient, she is ready to proceed with scheduling. Patient states that she is available 05/12/20. Patient is scheduled for an anorectal manometry on Friday, 05/12/20 at 10:30 AM. COVID test is scheduled for 05/09/20 at 1:45 PM. Patient has been advised of the appointments above, pt is aware that I will send this to her My Chart as well for her review. Patient verbalized understanding of all information and had no concerns at the end of the call.

## 2020-04-10 NOTE — Progress Notes (Signed)
Report given to PACU, vss 

## 2020-04-10 NOTE — Telephone Encounter (Signed)
-----   Message from Doran Stabler, MD sent at 04/10/2020  1:33 PM EST ----- Please arrange an anorectal manometry for this patient.  (Chronic constipation)  - HD

## 2020-04-10 NOTE — Progress Notes (Signed)
History reviewed today  VS SB

## 2020-04-12 ENCOUNTER — Telehealth: Payer: Self-pay

## 2020-04-12 ENCOUNTER — Telehealth: Payer: Self-pay | Admitting: *Deleted

## 2020-04-12 NOTE — Telephone Encounter (Signed)
No answer, left message to call back later today, B.Medford Staheli RN. 

## 2020-04-12 NOTE — Telephone Encounter (Signed)
No answer for post procedure call back. Unable to leave message. 

## 2020-04-14 ENCOUNTER — Other Ambulatory Visit: Payer: 59 | Admitting: Gastroenterology

## 2020-04-14 ENCOUNTER — Telehealth: Payer: Self-pay | Admitting: Gastroenterology

## 2020-04-14 NOTE — Telephone Encounter (Signed)
Spoke with patient, she has been advised that she will need to continue Levsin for now. Patient states that she still wants to hold off on CBC at this time, still denies any signs of rectal bleeding. Patient is aware that she will need to call us if her symptoms are worsening or not improving. Patient verbalized understanding and had no concerns at the end of the call.

## 2020-04-14 NOTE — Telephone Encounter (Signed)
The patient called overnight regarding some symptoms that have been bothering her recently.  She had a flexible sigmoidoscopy on Monday which showed interval improvement in her proctitis/colitis, on Entyvio.  Some biopsies were obtained.  She is scheduled for anorectal manometry to assess for pelvic floor dyssynergia.  She states on Wednesday night she started having some lower abdominal cramping.  She states she has had this in the past and can come and go.  Yesterday she had 2 episodes of bright red blood per rectum.  She denies any significant diarrhea or urgency.  She states it feels like the bleeding she had with a "tear" she has had in the past in her perianal area.  She has been given Desitin and RectiCare ointment in the past which helped with this.  She denies fevers, no severe abdominal pains.  No vomiting, tolerating p.o.  I discussed options with her.  She states Levsin does help with the abdominal cramping and recommend she continue that and take it scheduled today.  She can resume her RectiCare/Desitin cream for the rectal bleeding.  I think a complication from her flexible sigmoidoscopy at this point in time, with delayed bleeding from biopsies, would be very unusual.  Patient states she has had these similar symptoms in the past.  Her colitis on exam was recently evaluated and appeared improved from previous, thus a colitis flare would seem unusual as well. Quite possible she has an anal fissure / anorectal bleeding causing her symptoms in the setting of constipation, perhaps with some underlying bowel spasm.  She will resume Levsin and topical ointments that she has at home.  I offered her to come in for a CBC to make sure stable, she is declining that for now.  [place] can you please give her a call later this AM to check up on her and see how she is doing, Dr. Loletha Carrow is out of the office today so you can send a follow-up note to me. If she is getting worse may need an acute office visit /  labs.  Thank you very much   Mallie Mussel, just an [place], I know you are out of the office today

## 2020-04-14 NOTE — Telephone Encounter (Signed)
Spoke with patient she states that her stomach is still hurting, saw [facility] twice yesterday, hasn't seen any [facility] today, "yet." Patient states that she is still having lower abdominal cramping but she took Levsin not too long ago. Patient had no other concerns at the end of the call.

## 2020-04-14 NOTE — Telephone Encounter (Signed)
Okay. I would continue course for now with Levsin. If she wants to come in for CBC to ensure okay and provide reassurance that is fine. If Levsin is not helping, worsening pain, fevers, intolerance of PO, etc, she needs to call us back for reassessment. Thanks

## 2020-04-18 MED ORDER — AMITRIPTYLINE HCL 10 MG PO TABS: 10.0000 mg | ORAL_TABLET | Freq: Every day | ORAL | 0 refills | Status: DC

## 2020-05-03 ENCOUNTER — Encounter: Payer: Self-pay | Admitting: Gastroenterology

## 2020-05-03 NOTE — Progress Notes (Signed)
Patient had Entyvio on 04/28/2020 without difficulty.  - H. Danis

## 2020-05-05 ENCOUNTER — Other Ambulatory Visit: Payer: Self-pay | Admitting: Gastroenterology

## 2020-05-05 NOTE — Telephone Encounter (Signed)
This was a trial medication will await pt to give update before refills are dispensed. Sent her a Pharmacist, community message requesting update.

## 2020-05-05 NOTE — Telephone Encounter (Signed)
Pre screening COVID test and anorectal manometry cancelled.

## 2020-05-08 ENCOUNTER — Other Ambulatory Visit: Payer: Self-pay | Admitting: Gastroenterology

## 2020-05-08 NOTE — Telephone Encounter (Signed)
Understood, thanks.  - HD 

## 2020-05-09 ENCOUNTER — Other Ambulatory Visit (HOSPITAL_COMMUNITY): Payer: 59

## 2020-05-12 ENCOUNTER — Ambulatory Visit (HOSPITAL_COMMUNITY): Admission: RE | Admit: 2020-05-12 | Payer: 59 | Source: Home / Self Care | Admitting: Gastroenterology

## 2020-05-12 ENCOUNTER — Encounter (HOSPITAL_COMMUNITY): Admission: RE | Payer: Self-pay | Source: Home / Self Care

## 2020-05-12 SURGERY — MANOMETRY, ANORECTAL
Anesthesia: Choice

## 2020-05-23 ENCOUNTER — Other Ambulatory Visit: Payer: Self-pay | Admitting: Gastroenterology

## 2020-06-06 ENCOUNTER — Other Ambulatory Visit: Payer: Self-pay | Admitting: Gastroenterology

## 2020-06-12 NOTE — Telephone Encounter (Signed)
Difficult to say since proctitis was so mildly active when last checked 2 months ago.  However, I am amenable to trying the vedolizumab every 4 weeks for two infusions to see if we can get better control.  That is, of course, if insurance will allow.  - HD

## 2020-06-12 NOTE — Telephone Encounter (Signed)
New orders for Entyvio eery 4 weeks has been faxed to [facility] will work on authorization.

## 2020-06-16 ENCOUNTER — Encounter (HOSPITAL_BASED_OUTPATIENT_CLINIC_OR_DEPARTMENT_OTHER): Payer: Self-pay | Admitting: Emergency Medicine

## 2020-06-16 ENCOUNTER — Other Ambulatory Visit: Payer: Self-pay

## 2020-06-16 ENCOUNTER — Observation Stay (HOSPITAL_BASED_OUTPATIENT_CLINIC_OR_DEPARTMENT_OTHER)
Admission: EM | Admit: 2020-06-16 | Discharge: 2020-06-17 | Disposition: A | Discharge status: Home / Self Care | Payer: 59 | Attending: Internal Medicine | Admitting: Internal Medicine

## 2020-06-16 DIAGNOSIS — K625 Hemorrhage of anus and rectum: Secondary | ICD-10-CM | POA: Diagnosis present

## 2020-06-16 DIAGNOSIS — Z20822 Contact with and (suspected) exposure to covid-19: Secondary | ICD-10-CM | POA: Insufficient documentation

## 2020-06-16 DIAGNOSIS — D649 Anemia, unspecified: Secondary | ICD-10-CM

## 2020-06-16 DIAGNOSIS — K51911 Ulcerative colitis, unspecified with rectal bleeding: Secondary | ICD-10-CM | POA: Diagnosis not present

## 2020-06-16 DIAGNOSIS — Z87891 Personal history of nicotine dependence: Secondary | ICD-10-CM | POA: Diagnosis not present

## 2020-06-16 DIAGNOSIS — F32A Depression, unspecified: Secondary | ICD-10-CM | POA: Diagnosis present

## 2020-06-16 DIAGNOSIS — Z79899 Other long term (current) drug therapy: Secondary | ICD-10-CM | POA: Insufficient documentation

## 2020-06-16 DIAGNOSIS — J45909 Unspecified asthma, uncomplicated: Secondary | ICD-10-CM | POA: Diagnosis not present

## 2020-06-16 DIAGNOSIS — K5190 Ulcerative colitis, unspecified, without complications: Secondary | ICD-10-CM | POA: Diagnosis present

## 2020-06-16 LAB — COMPREHENSIVE METABOLIC PANEL
ALT: 23 U/L (ref 0–44)
AST: 23 U/L (ref 15–41)
Albumin: 4.2 g/dL (ref 3.5–5.0)
Alkaline Phosphatase: 107 U/L (ref 38–126)
Anion gap: 10 (ref 5–15)
BUN: 10 mg/dL (ref 6–20)
CO2: 23 mmol/L (ref 22–32)
Calcium: 9.3 mg/dL (ref 8.9–10.3)
Chloride: 102 mmol/L (ref 98–111)
Creatinine, Ser: 0.5 mg/dL (ref 0.44–1.00)
GFR, Estimated: >60 mL/min (ref >60)
Glucose, Bld: 97 mg/dL (ref 70–99)
Potassium: 3.8 mmol/L (ref 3.5–5.1)
Sodium: 135 mmol/L (ref 135–145)
Total Bilirubin: <0.1 mg/dL — ABNORMAL LOW (ref 0.3–1.2)
Total Protein: 7.7 g/dL (ref 6.5–8.1)

## 2020-06-16 LAB — CBC
HCT: 35.1 % — ABNORMAL LOW (ref 36.0–46.0)
Hemoglobin: 10.8 g/dL — ABNORMAL LOW (ref 12.0–15.0)
MCH: 22.5 pg — ABNORMAL LOW (ref 26.0–34.0)
MCHC: 30.8 g/dL (ref 30.0–36.0)
MCV: 73 fL — ABNORMAL LOW (ref 80.0–100.0)
Platelets: 451 10*3/uL — ABNORMAL HIGH (ref 150–400)
RBC: 4.81 MIL/uL (ref 3.87–5.11)
RDW: 18.1 % — ABNORMAL HIGH (ref 11.5–15.5)
WBC: 9 10*3/uL (ref 4.0–10.5)
nRBC: 0 % (ref 0.0–0.2)

## 2020-06-16 LAB — SEDIMENTATION RATE: Sed Rate: 27 mm/hr — ABNORMAL HIGH (ref 0–22)

## 2020-06-16 LAB — PREGNANCY, URINE: Preg Test, Ur: NEGATIVE

## 2020-06-16 LAB — OCCULT BLOOD X 1 CARD TO LAB, STOOL: Fecal Occult Bld: POSITIVE — AB

## 2020-06-16 MED ORDER — LACTATED RINGERS IV BOLUS
1000.0000 mL | Freq: Once | INTRAVENOUS | Status: AC
  Administered 2020-06-16: 1000 mL via INTRAVENOUS

## 2020-06-16 MED ORDER — PREDNISONE 20 MG PO TABS
40.0000 mg | ORAL_TABLET | Freq: Once | ORAL | Status: AC
  Administered 2020-06-16: 40 mg via ORAL
  Filled 2020-06-16: qty 2

## 2020-06-16 MED ORDER — ONDANSETRON HCL 4 MG/2ML IJ SOLN
4.0000 mg | Freq: Once | INTRAMUSCULAR | Status: AC
  Administered 2020-06-16: 4 mg via INTRAVENOUS
  Filled 2020-06-16: qty 2

## 2020-06-16 NOTE — ED Triage Notes (Signed)
Pt presents to ED POV. Pt c/o rectal bleeding since yesterday. Pt reports that she noted blood in the toilet. Pt also c/o suprapubic pain. Pt Hx of UC.

## 2020-06-16 NOTE — ED Notes (Signed)
Pt placed in a gown and hooked up to the monitor with BP cuff and pulse ox. Pt is aware she needs a urine sample

## 2020-06-16 NOTE — ED Notes (Signed)
Pt laid flat at 11:55 for orthostatic vitals

## 2020-06-16 NOTE — ED Provider Notes (Signed)
[facility] Provider Note   CSN: [id number] Arrival date & time: 06/16/20  2127     History Chief Complaint  Patient presents with  . Rectal Bleeding    Marissa Morales is a 25 y.o. female.   Rectal Bleeding Quality:  Bright red and mucoid Amount:  Copious Duration:  3 days Timing:  Intermittent Context comment:  Hx of UC Similar prior episodes: yes (but more volume and for longer)   Relieved by:  Nothing Worsened by:  Nothing Ineffective treatments:  None tried Associated symptoms: light-headedness   Associated symptoms: no fever and no vomiting        Past Medical History:  Diagnosis Date  . Allergy    cats  . Anemia   . Anxiety    panic attacks  . Depression   . GERD (gastroesophageal reflux disease)   . Hemorrhoids   . History of asthma    childhood  . IBS (irritable bowel syndrome)   . Pilonidal cyst   . Wears glasses     Patient Active Problem List   Diagnosis Date Noted  . Rectal bleeding 11/25/2019    Past Surgical History:  Procedure Laterality Date  . COLONOSCOPY WITH ESOPHAGOGASTRODUODENOSCOPY (EGD)  x2  last one 2015  . KNEE ARTHROSCOPY Left 03/30/2020  . PILONIDAL CYST EXCISION N/A 07/18/2017   Procedure: CYST EXCISION PILONIDAL;  Surgeon: Clovis Riley, MD;  Location: [facility];  Service: General;  Laterality: N/A;  . WISDOM TOOTH EXTRACTION  2015     OB History   No obstetric history on file.     Family History  Problem Relation Age of Onset  . Irritable bowel syndrome Mother   . Endometriosis Mother   . Colon cancer Mother   . Hypertension Father   . Colon polyps Father   . Endometriosis Sister   . Irritable bowel syndrome Sister   . Diverticulitis Sister   . Breast cancer Paternal Grandfather   . Heart attack Maternal Grandmother   . Liver cancer Neg Hx   . Rectal cancer Neg Hx   . Pancreatic cancer Neg Hx   . Stomach cancer Neg Hx   . Esophageal cancer Neg Hx      Social History   Tobacco Use  . Smoking status: Former Smoker    Packs/day: 0.50    Years: 5.00    Pack years: 2.50    Types: Cigarettes    Quit date: 05/15/2017    Years since quitting: 3.0  . Smokeless tobacco: Never Used  . Tobacco comment: 07-15-2017  per pt quit smoking cig. 05-15-2017 but occasionally vapes  Vaping Use  . Vaping Use: Former  Substance Use Topics  . Alcohol use: Not Currently    Comment: occasional  . Drug use: No    Home Medications Prior to Admission medications   Medication Sig Start Date End Date Taking? Authorizing Provider  albuterol (VENTOLIN HFA) 108 (90 Base) MCG/ACT inhaler Inhale into the lungs. 03/15/20   [provider]  amitriptyline (ELAVIL) 10 MG tablet Take 1 tablet (10 mg total) by mouth at bedtime. 05/05/20   Doran Stabler, MD  B Complex Vitamins (VITAMIN B COMPLEX PO) Take 1 Dose by mouth daily. Liquid complex    [provider]  benzonatate (TESSALON) 100 MG capsule Take by mouth. 03/23/20 03/23/21  [provider]  BIOTIN PO Take by mouth.    [provider]  Calcium Carb-Cholecalciferol (CALCIUM/VITAMIN D PO) Take  1 tablet by mouth daily.    [provider]  HYDROcodone-acetaminophen (NORCO/VICODIN) 5-325 MG tablet Take 1 tablet by mouth twice a day as needed for post op pain. 04/06/20   [provider]  hyoscyamine (LEVSIN SL) 0.125 MG SL tablet Place 1 tablet (0.125 mg total) under the tongue every 6 (six) hours as needed. 06/06/20   Doran Stabler, MD  Multiple Vitamin (MULTIVITAMIN) tablet Take 1 tablet by mouth daily.    [provider]  omeprazole (PRILOSEC) 40 MG capsule TAKE ONE CAPSULE BY MOUTH EVERY DAY 05/08/20   Doran Stabler, MD  ondansetron (ZOFRAN ODT) 4 MG disintegrating tablet 32m ODT q4 hours prn nausea/vomit Patient not taking: Reported on 04/10/2020 11/25/19   FJacqlyn Larsen PA-C  prochlorperazine (COMPAZINE) 10 MG tablet TAKE ONE TABLET BY MOUTH  EVERY 8 HOURS AS NEEDED FOR NAUSEA OR VOMITING 05/23/20   DDoran Stabler MD  sertraline (ZOLOFT) 50 MG tablet Take 1 tablet by mouth daily. 03/01/20   [provider]  VEDOLIZUMAB IV Inject into the vein.    [provider]  Zinc Sulfate (ZINC 15 PO) Take by mouth every morning.    [provider]    Allergies    Bactrim [sulfamethoxazole-trimethoprim], Bismuth-containing compounds, Doxycycline, Vancomycin, and Vantin [cefpodoxime]  Review of Systems   Review of Systems  Constitutional: Positive for fatigue. Negative for chills and fever.  HENT: Negative for congestion and rhinorrhea.   Respiratory: Negative for cough and shortness of breath.   Cardiovascular: Negative for chest pain and palpitations.  Gastrointestinal: Positive for anal bleeding, blood in stool and hematochezia. Negative for diarrhea, nausea and vomiting.  Genitourinary: Negative for difficulty urinating and dysuria.  Musculoskeletal: Negative for arthralgias and back pain.  Skin: Negative for rash and wound.  Neurological: Positive for light-headedness. Negative for headaches.    Physical Exam Updated Vital Signs BP 121/81   Pulse 86   Temp 98.3 F (36.8 C) (Oral)   Resp 18   Ht 5' 1"  (1.549 m)   Wt 77.1 kg   LMP 06/01/2020   SpO2 99%   BMI 32.12 kg/m   Physical Exam Vitals and nursing note reviewed. Exam conducted with a chaperone present.  Constitutional:      General: She is not in acute distress.    Appearance: Normal appearance.  HENT:     Head: Normocephalic and atraumatic.     Nose: No rhinorrhea.  Eyes:     General:        Right eye: No discharge.        Left eye: No discharge.     Conjunctiva/sclera: Conjunctivae normal.  Cardiovascular:     Rate and Rhythm: Normal rate and regular rhythm.  Pulmonary:     Effort: Pulmonary effort is normal. No respiratory distress.     Breath sounds: No stridor.  Abdominal:     General: Abdomen is flat. There is no  distension.     Palpations: Abdomen is soft.     Tenderness: There is no abdominal tenderness. There is no guarding or rebound.     Hernia: No hernia is present.  Genitourinary:    Comments: Empty rectal vault, faint bloody mucus Musculoskeletal:        General: No tenderness or signs of injury.  Skin:    General: Skin is warm and dry.  Neurological:     General: No focal deficit present.     Mental Status: She is alert.  Mental status is at baseline.     Motor: No weakness.  Psychiatric:        Mood and Affect: Mood normal.        Behavior: Behavior normal.     ED Results / Procedures / Treatments   Labs (all labs ordered are listed, but only abnormal results are displayed) Labs Reviewed  COMPREHENSIVE METABOLIC PANEL - Abnormal; Notable for the following components:      Result Value   Total Bilirubin <0.1 (*)    All other components within normal limits  CBC - Abnormal; Notable for the following components:   Hemoglobin 10.8 (*)    HCT 35.1 (*)    MCV 73.0 (*)    MCH 22.5 (*)    RDW 18.1 (*)    Platelets 451 (*)    All other components within normal limits  OCCULT BLOOD X 1 CARD TO LAB, STOOL - Abnormal; Notable for the following components:   Fecal Occult Bld POSITIVE (*)    All other components within normal limits  GASTROINTESTINAL PANEL BY PCR, STOOL (REPLACES STOOL CULTURE)  CALPROTECTIN, FECAL  PREGNANCY, URINE  SEDIMENTATION RATE  C-REACTIVE PROTEIN  POC OCCULT BLOOD, ED    EKG None  Radiology No results found.  Procedures Procedures   Medications Ordered in ED Medications  predniSONE (DELTASONE) tablet 40 mg (has no administration in time range)  ondansetron (ZOFRAN) injection 4 mg (4 mg Intravenous Given 06/16/20 2222)  lactated ringers bolus 1,000 mL (1,000 mLs Intravenous New Bag/Given 06/16/20 2222)    ED Course  I have reviewed the triage vital signs and the nursing notes.  Pertinent labs & imaging results that were available during my  care of the patient were reviewed by me and considered in my medical decision making (see chart for details).    MDM Rules/Calculators/A&P                          Patient with ulcerative colitis comes in with copious bright red blood per rectum.  She is feeling fatigued lightheaded initially was tachycardic.  Has decreased hemoglobin from baseline.  Will likely admit to medicine but will touch base with gastroenterology first to see if there is any medications or things we can do to help.  She has a benign abdomen with no signs of peritonitis and do not feel she needs emergent imaging at this time.  IV fluids is improved the heart rate.  I spoke to gastroenterology as this patient has a drop in hemoglobin has some symptoms of anemia.  They agree on admission to the medicine service with them as a consult as needed.  They recommended stool culture fecal calprotectin.  As well as a steroid burst.  I will consult hospitalist for admission.  Patient's heart rate is improved after IV fluids.  The patient will be admitted to the hospitalist.  For the remainder this patient's care please see inpatient team notes.  I will intervene as needed while the patient remains in the emergency department.    Final Clinical Impression(s) / ED Diagnoses Final diagnoses:  Rectal bleeding    Rx / DC Orders ED Discharge Orders    None       Breck Coons, MD 06/16/20 2313

## 2020-06-17 ENCOUNTER — Encounter (HOSPITAL_COMMUNITY): Payer: Self-pay | Admitting: Internal Medicine

## 2020-06-17 DIAGNOSIS — D509 Iron deficiency anemia, unspecified: Secondary | ICD-10-CM | POA: Diagnosis not present

## 2020-06-17 DIAGNOSIS — Z20822 Contact with and (suspected) exposure to covid-19: Secondary | ICD-10-CM | POA: Diagnosis not present

## 2020-06-17 DIAGNOSIS — Z87891 Personal history of nicotine dependence: Secondary | ICD-10-CM | POA: Diagnosis not present

## 2020-06-17 DIAGNOSIS — F419 Anxiety disorder, unspecified: Secondary | ICD-10-CM | POA: Diagnosis present

## 2020-06-17 DIAGNOSIS — K602 Anal fissure, unspecified: Secondary | ICD-10-CM | POA: Diagnosis not present

## 2020-06-17 DIAGNOSIS — J45909 Unspecified asthma, uncomplicated: Secondary | ICD-10-CM

## 2020-06-17 DIAGNOSIS — K51011 Ulcerative (chronic) pancolitis with rectal bleeding: Secondary | ICD-10-CM

## 2020-06-17 DIAGNOSIS — Z79899 Other long term (current) drug therapy: Secondary | ICD-10-CM | POA: Diagnosis not present

## 2020-06-17 DIAGNOSIS — K625 Hemorrhage of anus and rectum: Secondary | ICD-10-CM | POA: Diagnosis not present

## 2020-06-17 DIAGNOSIS — K51911 Ulcerative colitis, unspecified with rectal bleeding: Secondary | ICD-10-CM | POA: Diagnosis not present

## 2020-06-17 DIAGNOSIS — F32A Depression, unspecified: Secondary | ICD-10-CM | POA: Diagnosis present

## 2020-06-17 LAB — CBC
HCT: 35.1 % — ABNORMAL LOW (ref 36.0–46.0)
Hemoglobin: 10.6 g/dL — ABNORMAL LOW (ref 12.0–15.0)
MCH: 22.1 pg — ABNORMAL LOW (ref 26.0–34.0)
MCHC: 30.2 g/dL (ref 30.0–36.0)
MCV: 73.1 fL — ABNORMAL LOW (ref 80.0–100.0)
Platelets: 489 10*3/uL — ABNORMAL HIGH (ref 150–400)
RBC: 4.8 MIL/uL (ref 3.87–5.11)
RDW: 18 % — ABNORMAL HIGH (ref 11.5–15.5)
WBC: 8.3 10*3/uL (ref 4.0–10.5)
nRBC: 0 % (ref 0.0–0.2)

## 2020-06-17 LAB — RESP PANEL BY RT-PCR (FLU A&B, COVID) ARPGX2
Influenza A by PCR: NEGATIVE
Influenza B by PCR: NEGATIVE
SARS Coronavirus 2 by RT PCR: NEGATIVE

## 2020-06-17 LAB — C-REACTIVE PROTEIN: CRP: 1.5 mg/dL — ABNORMAL HIGH (ref <1.0)

## 2020-06-17 LAB — HIV ANTIBODY (ROUTINE TESTING W REFLEX): HIV Screen 4th Generation wRfx: NONREACTIVE

## 2020-06-17 MED ORDER — ACETAMINOPHEN 650 MG RE SUPP: 650.0000 mg | Freq: Four times a day (QID) | RECTAL | Status: DC | PRN

## 2020-06-17 MED ORDER — PROCHLORPERAZINE MALEATE 10 MG PO TABS
10.0000 mg | ORAL_TABLET | Freq: Four times a day (QID) | ORAL | Status: DC | PRN
  Filled 2020-06-17: qty 1

## 2020-06-17 MED ORDER — SERTRALINE HCL 50 MG PO TABS
50.0000 mg | ORAL_TABLET | Freq: Every day | ORAL | Status: DC
  Administered 2020-06-17: 50 mg via ORAL
  Filled 2020-06-17: qty 1

## 2020-06-17 MED ORDER — ACETAMINOPHEN 325 MG PO TABS: 650.0000 mg | ORAL_TABLET | Freq: Four times a day (QID) | ORAL | Status: DC | PRN

## 2020-06-17 MED ORDER — HYOSCYAMINE SULFATE 0.125 MG SL SUBL
0.1250 mg | SUBLINGUAL_TABLET | Freq: Four times a day (QID) | SUBLINGUAL | Status: DC | PRN
  Filled 2020-06-17: qty 1

## 2020-06-17 MED ORDER — DILTIAZEM GEL 2 %
1.0000 | Freq: Three times a day (TID) | CUTANEOUS | 1 refills | Status: DC
Start: 2020-06-17 — End: 2020-07-04

## 2020-06-17 MED ORDER — LACTATED RINGERS IV SOLN: INTRAVENOUS | Status: DC

## 2020-06-17 MED ORDER — AMITRIPTYLINE HCL 10 MG PO TABS
10.0000 mg | ORAL_TABLET | Freq: Every day | ORAL | Status: DC
  Filled 2020-06-17: qty 1

## 2020-06-17 MED ORDER — POLYETHYLENE GLYCOL 3350 17 G PO PACK: 17.0000 g | PACK | Freq: Every day | ORAL | 0 refills | Status: DC

## 2020-06-17 MED ORDER — ALBUTEROL SULFATE HFA 108 (90 BASE) MCG/ACT IN AERS
2.0000 | INHALATION_SPRAY | Freq: Four times a day (QID) | RESPIRATORY_TRACT | Status: DC | PRN
  Filled 2020-06-17: qty 6.7

## 2020-06-17 MED ORDER — POLYETHYLENE GLYCOL 3350 17 G PO PACK
17.0000 g | PACK | Freq: Every day | ORAL | Status: DC
  Administered 2020-06-17: 17 g via ORAL
  Filled 2020-06-17: qty 1

## 2020-06-17 MED ORDER — PANTOPRAZOLE SODIUM 40 MG PO TBEC
40.0000 mg | DELAYED_RELEASE_TABLET | Freq: Every day | ORAL | Status: DC
  Administered 2020-06-17: 40 mg via ORAL
  Filled 2020-06-17: qty 1

## 2020-06-17 NOTE — Progress Notes (Signed)
Patient arrived to 6N04, alert and orientedx4. Able to make needs known, ambulatory. No DOB/SOB,97%room air. c/o mild pain to lower abdomen. VS stable BP 138/96 RR18 PR 75 T97.8 . TRH Admit paged about patient's arrival. Will continue to close monitor patient.

## 2020-06-17 NOTE — Discharge Summary (Signed)
Patient admitted and discharged same day.  Seen by gastroenterology, plan made for follow-up outpatient.  Medically stable so able to go home with outpatient follow-up.

## 2020-06-17 NOTE — H&P (Signed)
History and Physical    Derika Eckles [id number]:[id number] DOB: Jun 29, 1995 DOA: 06/16/2020  PCP: Jettie Booze, NP  Patient coming from: Home  I have personally briefly reviewed patient's old medical records available.   Chief Complaint: Rectal bleeding  HPI: Syndey Morales is a 25 y.o. female with medical history significant of history of ulcerative colitis currently on vedolizumab and symptomatic treatment, chronic intermittent asthma presented to the urgent care with more than usual fresh rectal bleeding in the setting of known proctitis.  She also had developed some dizziness that prompted her to visit ER.  Denies any abdominal pain.  She had some nausea but no vomiting.  No change in medications.  Followed by outpatient gastroenterology. ED Course: Hemodynamically stable.  Due to significant symptoms was admitted to the hospital. Baseline hemoglobin 13-hemoglobin dropped to 10.6-10.8 and remained stable.  Took MiraLAX and had minimal blood with the stool. Seen by gastroenterology.  Discharged home same day.  Review of Systems: all systems are reviewed and pertinent positive as per HPI otherwise rest are negative.    Past Medical History:  Diagnosis Date  . Allergy    cats  . Anemia   . Anxiety    panic attacks  . Depression   . GERD (gastroesophageal reflux disease)   . Hemorrhoids   . History of asthma    childhood  . IBS (irritable bowel syndrome)   . Pilonidal cyst   . Wears glasses     Past Surgical History:  Procedure Laterality Date  . COLONOSCOPY WITH ESOPHAGOGASTRODUODENOSCOPY (EGD)  x2  last one 2015  . KNEE ARTHROSCOPY Left 03/30/2020  . PILONIDAL CYST EXCISION N/A 07/18/2017   Procedure: CYST EXCISION PILONIDAL;  Surgeon: Clovis Riley, MD;  Location: [facility];  Service: General;  Laterality: N/A;  . WISDOM TOOTH EXTRACTION  2015    Social history   reports that she quit smoking about 3 years ago. Her smoking use included cigarettes. She  has a 2.50 pack-year smoking history. She has never used smokeless tobacco. She reports previous alcohol use. She reports that she does not use drugs.  Allergies  Allergen Reactions  . Bactrim [Sulfamethoxazole-Trimethoprim] Nausea And Vomiting  . Bismuth-Containing Compounds   . Doxycycline Nausea And Vomiting  . Vancomycin Itching    Severe pruritus of scalp, flushing  . Vantin [Cefpodoxime] Other (See Comments)    "unknown childhood reaction"    Family History  Problem Relation Age of Onset  . Irritable bowel syndrome Mother   . Endometriosis Mother   . Colon cancer Mother   . Hypertension Father   . Colon polyps Father   . Endometriosis Sister   . Irritable bowel syndrome Sister   . Diverticulitis Sister   . Breast cancer Paternal Grandfather   . Heart attack Maternal Grandmother   . Liver cancer Neg Hx   . Rectal cancer Neg Hx   . Pancreatic cancer Neg Hx   . Stomach cancer Neg Hx   . Esophageal cancer Neg Hx      Prior to Admission medications   Medication Sig Start Date End Date Taking? Authorizing Provider  diltiazem 2 % GEL Apply 1 application topically 3 (three) times daily. To the rectum 06/17/20  Yes Fayez Sturgell, Dante Gang, MD  albuterol (VENTOLIN HFA) 108 (90 Base) MCG/ACT inhaler Inhale into the lungs. 03/15/20   [provider]  amitriptyline (ELAVIL) 10 MG tablet Take 1 tablet (10 mg total) by mouth at bedtime. 05/05/20   Nelida Meuse  III, MD  B Complex Vitamins (VITAMIN B COMPLEX PO) Take 1 Dose by mouth daily. Liquid complex    [provider]  benzonatate (TESSALON) 100 MG capsule Take by mouth. 03/23/20 03/23/21  [provider]  BIOTIN PO Take by mouth.    [provider]  Calcium Carb-Cholecalciferol (CALCIUM/VITAMIN D PO) Take 1 tablet by mouth daily.    [provider]  hyoscyamine (LEVSIN SL) 0.125 MG SL tablet Place 1 tablet (0.125 mg total) under the tongue every 6 (six) hours as needed. 06/06/20   Doran Stabler,  MD  Multiple Vitamin (MULTIVITAMIN) tablet Take 1 tablet by mouth daily.    [provider]  omeprazole (PRILOSEC) 40 MG capsule TAKE ONE CAPSULE BY MOUTH EVERY DAY 05/08/20   [name], MD  polyethylene glycol (MIRALAX / GLYCOLAX) 17 g packet Take 17 g by mouth daily. 06/18/20   Barb Merino, MD  prochlorperazine (COMPAZINE) 10 MG tablet TAKE ONE TABLET BY MOUTH EVERY 8 HOURS AS NEEDED FOR NAUSEA OR VOMITING 05/23/20   Doran Stabler, MD  sertraline (ZOLOFT) 50 MG tablet Take 1 tablet by mouth daily. 03/01/20   [provider]  VEDOLIZUMAB IV Inject into the vein.    [provider]  Zinc Sulfate (ZINC 15 PO) Take by mouth every morning.    [provider]    Physical Exam: Vitals:   06/16/20 2245 06/17/20 0244 06/17/20 0603 06/17/20 0946  BP: 121/81 (!) 138/96 128/86 118/70  Pulse: 86 75 92 100  Resp: 18 18 18 18   Temp:  97.8 F (36.6 C) 98.4 F (36.9 C) 97.9 F (36.6 C)  TempSrc:  Oral Oral Oral  SpO2: 99% 97% 98% 98%  Weight:  77.4 kg    Height:        Constitutional: NAD, calm, comfortable Vitals:   06/16/20 2245 06/17/20 0244 06/17/20 0603 06/17/20 0946  BP: 121/81 (!) 138/96 128/86 118/70  Pulse: 86 75 92 100  Resp: 18 18 18 18   Temp:  97.8 F (36.6 C) 98.4 F (36.9 C) 97.9 F (36.6 C)  TempSrc:  Oral Oral Oral  SpO2: 99% 97% 98% 98%  Weight:  77.4 kg    Height:       General: Comfortable.  On room air. Cardiovascular: S1-S2 normal.  No added sounds Respiratory: Bilateral clear.  No added sounds Gastrointestinal: Soft.  Nontender.  Bowel sounds present.  Rectal exam, deferred by this provider.  ER physician reported rectal exam with trace of bloody mucus. Ext: Soft.  No edema.  No swelling.  No joint tenderness. Neuro: Normal.     Labs on Admission: I have personally reviewed following labs and imaging studies  CBC: Recent Labs  Lab 06/16/20 2155 06/17/20 0727  WBC 9.0 8.3  HGB 10.8* 10.6*  HCT 35.1* 35.1*   MCV 73.0* 73.1*  PLT 451* 121*   Basic Metabolic Panel: Recent Labs  Lab 06/16/20 2155  NA 135  K 3.8  CL 102  CO2 23  GLUCOSE 97  BUN 10  CREATININE 0.50  CALCIUM 9.3   GFR: Estimated Creatinine Clearance: 101.1 mL/min (by C-G formula based on SCr of 0.5 mg/dL). Liver Function Tests: Recent Labs  Lab 06/16/20 2155  AST 23  ALT 23  ALKPHOS 107  BILITOT <0.1*  PROT 7.7  ALBUMIN 4.2   No results for input(s): LIPASE, AMYLASE in the last 168 hours. No results for input(s): AMMONIA in the last 168 hours. Coagulation  Profile: No results for input(s): INR, PROTIME in the last 168 hours. Cardiac Enzymes: No results for input(s): CKTOTAL, CKMB, CKMBINDEX, TROPONINI in the last 168 hours. BNP (last 3 results) No results for input(s): PROBNP in the last 8760 hours. HbA1C: No results for input(s): HGBA1C in the last 72 hours. CBG: No results for input(s): GLUCAP in the last 168 hours. Lipid Profile: No results for input(s): CHOL, HDL, LDLCALC, TRIG, CHOLHDL, LDLDIRECT in the last 72 hours. Thyroid Function Tests: No results for input(s): TSH, T4TOTAL, FREET4, T3FREE, THYROIDAB in the last 72 hours. Anemia Panel: No results for input(s): VITAMINB12, FOLATE, FERRITIN, TIBC, IRON, RETICCTPCT in the last 72 hours. Urine analysis:    Component Value Date/Time   COLORURINE YELLOW 11/25/2019 [place] 11/25/2019 1327   LABSPEC 1.010 11/25/2019 1327   PHURINE 7.0 11/25/2019 1327   GLUCOSEU NEGATIVE 11/25/2019 1327   HGBUR NEGATIVE 11/25/2019 1327   [place] 11/25/2019 1327   [place] 11/25/2019 1327   PROTEINUR NEGATIVE 11/25/2019 1327   NITRITE NEGATIVE 11/25/2019 1327   LEUKOCYTESUR SMALL (A) 11/25/2019 1327    Radiological Exams on Admission: No results found.  EKG: Independently reviewed.  Not done.  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  This was thought to be outlet bleeding. As per GI recommendation, discharged home on Scheduled MiraLAX Diltiazem 2% gel rectally to use 3 times a day.  Avoid constipation. High-fiber diet. Currently no indication for colonoscopy or sigmoidoscopy, she will report if any significant ongoing bleeding.  Discharged home same day after seen by gastroenterology.   DVT prophylaxis: SCDs Code Status: Full code Family Communication: None Disposition Plan: Home Consults called: Gastroenterology Admission status: Observation.  Same-day admit and discharge.   Barb Merino MD [facility] Pager [phone]

## 2020-06-17 NOTE — Consult Note (Addendum)
Referring Provider: No ref. 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 Acute presentation likely due to posterior anal fissure seen on exam today. However, concurrent colitis flare must be considered, particularly given her progressive anemia and leukocytosis. She would like to go home.     PLAN: - Stool for GI pathogen panel to exclude infection and fecal calprotectin to evaluate for inflammation - Sitz baths two to three times daily for pain relief - High fiber diet - increasing both dietary fiber (35 grams daily) and water intake  - Citrucel or Metamucil daily - Continue Miralax 17 g daily, increase if needed to twice daily if needed to keep stools soft - Diltiazem 2% compounded with lidocaine 5% ointment applied to the rectum 3 times daily x 8 weeks - Consider outpatient flex sig if symptoms persist to exclude colitis as the cause of her bleeding - Follow hemoglobin, obtain iron studies with follow-up labs later this week  Please see the "Patient Instructions" section for addition details about the plan.  HPI: Marissa Morales is a 25 y.o. female admitted with rectal pain and bleeding. The history is obtained through the patient and review of her electronic health record. She is followed by Dr. 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  She also has anxiety and panic attacks.  Her last flexible sigmoidoscopy 04/10/20 showed mild proctitis, improved from 10/2019.   Developed rectal bleeding over the last 2 days with associated rectal pain. Concurrent exacerbation in constipation with hard stools with significant straining. Felt like something tore a couple of days ago.  Notes she has not been good about using her Miralax, and even if she uses it every day, doesn't feel like it makes a difference. Feels the symptoms are different than those that she experiences with colitis flares. No abdominal pain except for mild left lower quadrant discomfort.   Presented to the ED when she felt dizzy.  No diarrhea or nocturnal symptoms. No rash, bruise, myalgias, arthralgias, eye symptoms, or urinary symptoms. Appetite and energy level are normal.  No fevers, chills, or night sweats.   Presented to the ED. With her history of colitis and a normal rectal exam by the ED staff, she was admitted for further evaluation. Hemoglobin 10.8 in the ED, had been 13.3 12/2019. MCV 73.1, RDW 18. CMP normal. WBC normal. Platelets have been persistent elevated since last year and are now 489.   Has not had a bowel movement or any bleeding since she was admitted.     Past Medical History:  Diagnosis Date  . Allergy    cats  . Anemia   . Anxiety    panic attacks  . Depression   . GERD (gastroesophageal reflux disease)   . Hemorrhoids   . History of asthma    childhood  . IBS (irritable bowel syndrome)   . Pilonidal cyst   . Wears glasses  Past Surgical History:  Procedure Laterality Date  . COLONOSCOPY WITH ESOPHAGOGASTRODUODENOSCOPY (EGD)  x2  last one 2015  . KNEE ARTHROSCOPY Left 03/30/2020  . PILONIDAL CYST EXCISION N/A 07/18/2017   Procedure: CYST EXCISION PILONIDAL;  Surgeon: Clovis Riley, MD;  Location: [facility];  Service: General;  Laterality: N/A;  . WISDOM TOOTH EXTRACTION  2015    Current  Facility-Administered Medications  Medication Dose Route Frequency Provider Last Rate Last Admin  . acetaminophen (TYLENOL) tablet 650 mg  650 mg Oral Q6H PRN Barb Merino, MD       Or  . acetaminophen (TYLENOL) suppository 650 mg  650 mg Rectal Q6H PRN Barb Merino, MD      . albuterol (VENTOLIN HFA) 108 (90 Base) MCG/ACT inhaler 2 puff  2 puff Inhalation Q6H PRN Barb Merino, MD      . amitriptyline (ELAVIL) tablet 10 mg  10 mg Oral QHS Ghimire, Dante Gang, MD      . hyoscyamine (LEVSIN SL) SL tablet 0.125 mg  0.125 mg Sublingual Q6H PRN Barb Merino, MD      . lactated ringers infusion   Intravenous Continuous Barb Merino, MD 125 mL/hr at 06/17/20 0855 New Bag at 06/17/20 0855  . pantoprazole (PROTONIX) EC tablet 40 mg  40 mg Oral Daily Barb Merino, MD   40 mg at 06/17/20 1222  . polyethylene glycol (MIRALAX / GLYCOLAX) packet 17 g  17 g Oral Daily Barb Merino, MD   17 g at 06/17/20 0839  . prochlorperazine (COMPAZINE) tablet 10 mg  10 mg Oral Q6H PRN Barb Merino, MD      . sertraline (ZOLOFT) tablet 50 mg  50 mg Oral Daily Barb Merino, MD   50 mg at 06/17/20 1222    Allergies as of 06/16/2020 - Review Complete 06/16/2020  Allergen Reaction Noted  . Bactrim [sulfamethoxazole-trimethoprim] Nausea And Vomiting 01/15/2017  . Bismuth-containing compounds  06/15/2019  . Doxycycline Nausea And Vomiting 01/15/2017  . Vancomycin Itching 07/18/2017  . Vantin [cefpodoxime] Other (See Comments) 01/15/2017    Family History  Problem Relation Age of Onset  . Irritable bowel syndrome Mother   . Endometriosis Mother   . Colon cancer Mother   . Hypertension Father   . Colon polyps Father   . Endometriosis Sister   . Irritable bowel syndrome Sister   . Diverticulitis Sister   . Breast cancer Paternal Grandfather   . Heart attack Maternal Grandmother   . Liver cancer Neg Hx   . Rectal cancer Neg Hx   . Pancreatic cancer Neg Hx   . Stomach cancer Neg Hx   . Esophageal cancer  Neg Hx      Review of Systems: 12 system ROS is negative except as noted above.   Physical Exam: General:   Alert,  well-nourished, pleasant and cooperative in NAD Head:  Normocephalic and atraumatic. Eyes:  Sclera clear, no icterus.   Conjunctiva pink. Ears:  Normal auditory acuity. Nose:  No deformity, discharge,  or lesions. Mouth:  No deformity or lesions.   Neck:  Supple; no masses or thyromegaly. Lungs:  Clear throughout to auscultation.   No wheezes. Heart:  Regular rate and rhythm; no murmurs. Abdomen:  Soft, nontender, nondistended, normal bowel sounds, no rebound or guarding. No hepatosplenomegaly.   Rectal:   No chemical dermatitis. There is a tender posterior anal fissure. No external hemorrhoids or fistula. No prolapsing hemorrhoids. No rectal prolapse. Normal anocutaneous reflex. No stool in the rectal vault. No mass  or fecal impaction. Normal anal resting tone. Nursing Tech was the chaperone.  Msk:  Symmetrical. No boney deformities LAD: No inguinal or umbilical LAD Extremities:  No clubbing or edema. Neurologic:  Alert and  oriented x4;  grossly nonfocal Skin:  Intact without significant lesions or rashes. Psych:  Alert and cooperative. Normal mood and affect.   Lab Results: Recent Labs    06/16/20 2155 06/17/20 0727  WBC 9.0 8.3  HGB 10.8* 10.6*  HCT 35.1* 35.1*  PLT 451* 489*   BMET Recent Labs    06/16/20 2155  NA 135  K 3.8  CL 102  CO2 23  GLUCOSE 97  BUN 10  CREATININE 0.50  CALCIUM 9.3   LFT Recent Labs    06/16/20 2155  PROT 7.7  ALBUMIN 4.2  AST 23  ALT 23  ALKPHOS 107  BILITOT <0.1*     Shereda Graw L. Tarri Glenn, MD, MPH 06/17/2020, 12:34 PM

## 2020-06-17 NOTE — Telephone Encounter (Signed)
Marissa Morales was admitted overnight to [facility]. On evaluation this morning, she has a posterior anal fissure. No symptoms beyond rectal pain and bleeding, but, given her progressive microcytic anemia and thrombocytosis, concurrent IBD flare must be considered. She feels these symptoms are different from her IBD. No systemic complaints.  She wanted to go home today on conservative therapy.  Marissa Morales, please arrange for CBC and iron studies later this week. Please call the patient Monday for a symptom update.

## 2020-06-19 NOTE — Telephone Encounter (Signed)
Spoke with patient to see how she is feeling. Patient states that she is feeling better today. She states that she is no longer having any rectal pain or any bleeding at this time. Patient is aware that Dr. Tarri Glenn would like her to come in later this week for lab work. She is aware that no appt is necessary for lab work and she can stop by at her convenience between 7:30 AM - 5 PM to have this completed. She has also reviewed Dr. Loletha Carrow' recommendations, she states that she will need to check her calendar in regards to follow up appt but will let us know if the appt does not work for her. She states that she will also send a message in regards to the medications that were prescribed during her hospital visit. Patient had no other concerns at the end of the call.

## 2020-06-19 NOTE — Telephone Encounter (Signed)
I am sorry to hear she still having difficulty.  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  We will have to hold off on that for now until the fissure heals.  Increase the MiraLAX to a full capful twice a day.  Use RectiCare over-the-counter ointment at least 3 times a day (and as needed, especially after BMs) for pain control.  I see she is on the clinic schedule for May 11th. At that time I will reexamine the fissure and see if it needs additional ointment and what else we might use to help relieve her constipation.  - HD

## 2020-06-19 NOTE — Addendum Note (Signed)
Addended by: Yevette Edwards on: 06/19/2020 02:31 PM   Modules accepted: Orders

## 2020-06-26 NOTE — Telephone Encounter (Signed)
Spoke with patient, her appt has been rescheduled to Tuesday, 07/04/20 at 1:20 PM. Patient had no concerns at the end of the call.

## 2020-06-28 ENCOUNTER — Other Ambulatory Visit: Payer: Self-pay

## 2020-06-28 ENCOUNTER — Ambulatory Visit: Payer: 59 | Admitting: Gastroenterology

## 2020-06-28 ENCOUNTER — Other Ambulatory Visit (INDEPENDENT_AMBULATORY_CARE_PROVIDER_SITE_OTHER): Payer: 59

## 2020-06-28 DIAGNOSIS — K625 Hemorrhage of anus and rectum: Secondary | ICD-10-CM | POA: Diagnosis not present

## 2020-06-28 DIAGNOSIS — D649 Anemia, unspecified: Secondary | ICD-10-CM

## 2020-06-28 LAB — CBC WITH DIFFERENTIAL/PLATELET
Basophils Absolute: 0.1 10*3/uL (ref 0.0–0.1)
Basophils Relative: 0.7 % (ref 0.0–3.0)
Eosinophils Absolute: 0.3 10*3/uL (ref 0.0–0.7)
Eosinophils Relative: 4 % (ref 0.0–5.0)
HCT: 32.2 % — ABNORMAL LOW (ref 36.0–46.0)
Hemoglobin: 10.4 g/dL — ABNORMAL LOW (ref 12.0–15.0)
Lymphocytes Relative: 25.9 % (ref 12.0–46.0)
Lymphs Abs: 2.1 10*3/uL (ref 0.7–4.0)
MCHC: 32.2 g/dL (ref 30.0–36.0)
MCV: 68.3 fl — ABNORMAL LOW (ref 78.0–100.0)
Monocytes Absolute: 0.5 10*3/uL (ref 0.1–1.0)
Monocytes Relative: 6.3 % (ref 3.0–12.0)
Neutro Abs: 5.1 10*3/uL (ref 1.4–7.7)
Neutrophils Relative %: 63.1 % (ref 43.0–77.0)
Platelets: 473 10*3/uL — ABNORMAL HIGH (ref 150.0–400.0)
RBC: 4.71 Mil/uL (ref 3.87–5.11)
RDW: 18 % — ABNORMAL HIGH (ref 11.5–15.5)
WBC: 8.1 10*3/uL (ref 4.0–10.5)

## 2020-06-28 LAB — IBC + FERRITIN
Ferritin: 6 ng/mL — ABNORMAL LOW (ref 10.0–291.0)
Iron: 22 ug/dL — ABNORMAL LOW (ref 42–145)
Saturation Ratios: 4.6 % — ABNORMAL LOW (ref 20.0–50.0)
Transferrin: 345 mg/dL (ref 212.0–360.0)

## 2020-06-28 MED ORDER — PROCHLORPERAZINE MALEATE 10 MG PO TABS: 10.0000 mg | ORAL_TABLET | Freq: Three times a day (TID) | ORAL | 0 refills | Status: DC | PRN

## 2020-06-28 MED ORDER — OMEPRAZOLE 40 MG PO CPDR: 1.0000 | DELAYED_RELEASE_CAPSULE | Freq: Every day | ORAL | 1 refills | Status: DC

## 2020-07-04 ENCOUNTER — Ambulatory Visit (INDEPENDENT_AMBULATORY_CARE_PROVIDER_SITE_OTHER): Payer: 59 | Admitting: Gastroenterology

## 2020-07-04 ENCOUNTER — Encounter: Payer: Self-pay | Admitting: Gastroenterology

## 2020-07-04 ENCOUNTER — Other Ambulatory Visit: Payer: Self-pay

## 2020-07-04 VITALS — BP 110/80 | HR 87 | Ht 61.0 in | Wt 172.0 lb

## 2020-07-04 DIAGNOSIS — K5909 Other constipation: Secondary | ICD-10-CM | POA: Diagnosis not present

## 2020-07-04 DIAGNOSIS — K51211 Ulcerative (chronic) proctitis with rectal bleeding: Secondary | ICD-10-CM

## 2020-07-04 DIAGNOSIS — K602 Anal fissure, unspecified: Secondary | ICD-10-CM

## 2020-07-04 DIAGNOSIS — D500 Iron deficiency anemia secondary to blood loss (chronic): Secondary | ICD-10-CM

## 2020-07-04 DIAGNOSIS — K6289 Other specified diseases of anus and rectum: Secondary | ICD-10-CM

## 2020-07-04 NOTE — Progress Notes (Deleted)
[facility] Consult Note:  History: Marissa Morales 07/04/2020  Referring provider: Jettie Booze, NP  Reason for consult/chief complaint: Ulcerative Colitis (Recent hosptial with rectal bleeding, now resolved but hemoglobin still low. Patient not having rectal/anal pain.)   Subjective  HPI:  ***   ROS:  Review of Systems   Past Medical History: Past Medical History:  Diagnosis Date  . Allergy    cats  . Anemia   . Anxiety    panic attacks  . Depression   . GERD (gastroesophageal reflux disease)   . Hemorrhoids   . History of asthma    childhood  . IBS (irritable bowel syndrome)   . Pilonidal cyst   . Wears glasses      Past Surgical History: Past Surgical History:  Procedure Laterality Date  . COLONOSCOPY WITH ESOPHAGOGASTRODUODENOSCOPY (EGD)  x2  last one 2015  . KNEE ARTHROSCOPY Left 03/30/2020  . PILONIDAL CYST EXCISION N/A 07/18/2017   Procedure: CYST EXCISION PILONIDAL;  Surgeon: Clovis Riley, MD;  Location: [place];  Service: General;  Laterality: N/A;  . WISDOM TOOTH EXTRACTION  2015     Family History: Family History  Problem Relation Age of Onset  . Irritable bowel syndrome Mother   . Endometriosis Mother   . Colon cancer Mother   . Hypertension Father   . Colon polyps Father   . Endometriosis Sister   . Irritable bowel syndrome Sister   . Diverticulitis Sister   . Breast cancer Paternal Grandfather   . Heart attack Maternal Grandmother   . Liver cancer Neg Hx   . Rectal cancer Neg Hx   . Pancreatic cancer Neg Hx   . Stomach cancer Neg Hx   . Esophageal cancer Neg Hx     Social History: Social History   Socioeconomic History  . Marital status: Single    Spouse name: Not on file  . Number of children: Not on file  . Years of education: Not on file  . Highest education level: Not on file  Occupational History  . Not on file  Tobacco Use  . Smoking status: Former Smoker     Packs/day: 0.50    Years: 5.00    Pack years: 2.50    Types: Cigarettes    Quit date: 05/15/2017    Years since quitting: 3.1  . Smokeless tobacco: Never Used  . Tobacco comment: 07-15-2017  per pt quit smoking cig. 05-15-2017 but occasionally vapes  Vaping Use  . Vaping Use: Former  Substance and Sexual Activity  . Alcohol use: Not Currently    Comment: occasional  . Drug use: No  . Sexual activity: Yes    Birth control/protection: None  Other Topics Concern  . Not on file  Social History Narrative  . Not on file   Social Determinants of Health   Financial Resource Strain: Not on file  Food Insecurity: Not on file  Transportation Needs: Not on file  Physical Activity: Not on file  Stress: Not on file  Social Connections: Not on file    Allergies: Allergies  Allergen Reactions  . Bactrim [Sulfamethoxazole-Trimethoprim] Nausea And Vomiting  . Bismuth-Containing Compounds   . Doxycycline Nausea And Vomiting  . Vancomycin Itching    Severe pruritus of scalp, flushing  . Vantin [Cefpodoxime] Other (See Comments)    "unknown childhood reaction"    Outpatient Meds: Current Outpatient Medications  Medication Sig Dispense Refill  . albuterol (VENTOLIN HFA) 108 (90 Base) MCG/ACT  inhaler Inhale into the lungs.    Marland Kitchen amitriptyline (ELAVIL) 10 MG tablet Take 1 tablet (10 mg total) by mouth at bedtime. 30 tablet 2  . B Complex Vitamins (VITAMIN B COMPLEX PO) Take 1 Dose by mouth daily. Liquid complex    . BIOTIN PO Take by mouth.    . Calcium Carb-Cholecalciferol (CALCIUM/VITAMIN D PO) Take 1 tablet by mouth daily.    . hyoscyamine (LEVSIN SL) 0.125 MG SL tablet Place 1 tablet (0.125 mg total) under the tongue every 6 (six) hours as needed. 45 tablet 1  . Multiple Vitamin (MULTIVITAMIN) tablet Take 1 tablet by mouth daily.    Marland Kitchen omeprazole (PRILOSEC) 40 MG capsule Take 1 capsule (40 mg total) by mouth daily. 90 capsule 1  . polyethylene glycol (MIRALAX / GLYCOLAX) 17 g packet  Take 17 g by mouth daily. 14 each 0  . prochlorperazine (COMPAZINE) 10 MG tablet Take 1 tablet (10 mg total) by mouth every 8 (eight) hours as needed for nausea or vomiting (3 month supply). 60 tablet 0  . sertraline (ZOLOFT) 50 MG tablet Take 1 tablet by mouth daily.    . VEDOLIZUMAB IV Inject into the vein.    . Zinc Sulfate (ZINC 15 PO) Take by mouth every morning.     No current facility-administered medications for this visit.      ___________________________________________________________________ Objective   Exam:  BP 110/80   Pulse 87   Ht 5' 1"  (1.549 m)   Wt 172 lb (78 kg)   SpO2 99%   BMI 32.50 kg/m  Wt Readings from Last 3 Encounters:  07/04/20 172 lb (78 kg)  06/17/20 170 lb 10.2 oz (77.4 kg)  04/10/20 157 lb (71.2 kg)     General: ***   Eyes: sclera anicteric, no redness  ENT: oral mucosa moist without lesions, no cervical or supraclavicular lymphadenopathy  CV: RRR without murmur, S1/S2, no JVD, no peripheral edema  Resp: clear to auscultation bilaterally, normal RR and effort noted  GI: soft, *** tenderness, with active bowel sounds. No guarding or palpable organomegaly noted.  Skin; warm and dry, no rash or jaundice noted  Neuro: awake, alert and oriented x 3. Normal gross motor function and fluent speech  Labs:  CBC Latest Ref Rng & Units 06/28/2020 06/17/2020 06/16/2020  WBC 4.0 - 10.5 K/uL 8.1 8.3 9.0  Hemoglobin 12.0 - 15.0 g/dL 10.4(L) 10.6(L) 10.8(L)  Hematocrit 36.0 - 46.0 % 32.2(L) 35.1(L) 35.1(L)  Platelets 150.0 - 400.0 K/uL 473.0(H) 489(H) 451(H)     Radiologic Studies:  ***  Assessment: No diagnosis found.  ***  Plan: feraheme 510 mg x 2 ***  Thank you for the courtesy of this consult.  Please call me with any questions or concerns.  [name]  CC: Referring provider noted above

## 2020-07-04 NOTE — Patient Instructions (Signed)
If you are age 26 or older, your body mass index should be between 23-30. Your Body mass index is 32.5 kg/m. If this is out of the aforementioned range listed, please consider follow up with your Primary Care Provider.  If you are age 65 or younger, your body mass index should be between 19-25. Your Body mass index is 32.5 kg/m. If this is out of the aformentioned range listed, please consider follow up with your Primary Care Provider.   You have been scheduled to have an anorectal manometry at [facility] on 07-24-2020 at 1030am. Please arrive 30 minutes prior to your appointment time for registration (1st floor of the hospital-admissions).  Please make certain to use 1 Fleets enema 2 hours prior to coming for your appointment. You can purchase Fleets enemas from the laxative section at your drug store. You should not eat anything during the two hours prior to the procedure. You may take regular medications with small sips of water at least 2 hours prior to the study.  Anorectal manometry is a test performed to evaluate patients with constipation or fecal incontinence. This test measures the pressures of the anal sphincter muscles, the sensation in the rectum, and the neural reflexes that are needed for normal bowel movements.  THE PROCEDURE The test takes approximately 30 minutes to 1 hour. You will be asked to change into a hospital gown. A technician or nurse will explain the procedure to you, take a brief health history, and answer any questions you may have. The patient then lies on his or her left side. A small, flexible tube, about the size of a thermometer, with a balloon at the end is inserted into the rectum. The catheter is connected to a machine that measures the pressure. During the test, the small balloon attached to the catheter may be inflated in the rectum to assess the normal reflex pathways. The nurse or technician may also ask the person to squeeze, relax, and push at various  times. The anal sphincter muscle pressures are measured during each of these maneuvers. To squeeze, the patient tightens the sphincter muscles as if trying to prevent anything from coming out. To push or bear down, the patient strains down as if trying to have a bowel movement.  [phone] to cancelor reschule  It was a pleasure to see you today!  Thank you for trusting me with your gastrointestinal care!

## 2020-07-04 NOTE — Progress Notes (Signed)
[facility] Progress Note  Chief Complaint: Anal fissure and ulcerative proctitis  Subjective  History: Marissa follows up after recent brief hospital stay, where she was admitted with rectal pain and bleeding and found to have an anal fissure.  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  She had an anorectal manometry scheduled but had to cancel for insurance/cost reasons.  She has now met her deductible and would like to pursue that. [name] has been using RectiCare ointment for the fissure and says the pain has stopped and the bleeding decreased.  She has also continued amitriptyline 10 mg nightly and finds that that decreases the rectal pain.  She still has "dark blood" that she attributes to the proctitis and some rectal pain with constipation.  She takes MiraLAX sometimes but has to stop and stool gets too loose  ROS: Cardiovascular:  no chest pain Respiratory: no dyspnea  The patient's Past Medical, Family and Social History were reviewed and are on file in the EMR.  Objective:  Med list reviewed  Current Outpatient Medications:  .  albuterol (VENTOLIN HFA) 108 (90 Base) MCG/ACT inhaler, Inhale into the lungs., Disp: , Rfl:  .  amitriptyline (ELAVIL) 10 MG tablet, Take 1 tablet (10 mg total) by mouth at bedtime., Disp: 30 tablet, Rfl: 2 .  B Complex Vitamins (VITAMIN B COMPLEX PO), Take 1 Dose by mouth daily. Liquid complex, Disp: , Rfl:  .  BIOTIN PO, Take by mouth., Disp: , Rfl:  .  Calcium Carb-Cholecalciferol (CALCIUM/VITAMIN D PO), Take 1 tablet by mouth daily., Disp: , Rfl:  .  hyoscyamine (LEVSIN SL) 0.125 MG SL tablet, Place 1 tablet (0.125 mg total) under the tongue every 6 (six) hours as needed., Disp: 45 tablet, Rfl: 1 .  Multiple Vitamin (MULTIVITAMIN) tablet, Take 1 tablet by mouth daily., Disp: , Rfl:  .   omeprazole (PRILOSEC) 40 MG capsule, Take 1 capsule (40 mg total) by mouth daily., Disp: 90 capsule, Rfl: 1 .  polyethylene glycol (MIRALAX / GLYCOLAX) 17 g packet, Take 17 g by mouth daily., Disp: 14 each, Rfl: 0 .  prochlorperazine (COMPAZINE) 10 MG tablet, Take 1 tablet (10 mg total) by mouth every 8 (eight) hours as needed for nausea or vomiting (3 month supply)., Disp: 60 tablet, Rfl: 0 .  sertraline (ZOLOFT) 50 MG tablet, Take 1 tablet by mouth daily., Disp: , Rfl:  .  VEDOLIZUMAB IV, Inject into the vein., Disp: , Rfl:  .  Zinc Sulfate (ZINC 15 PO), Take by mouth every morning., Disp: , Rfl:    Vital signs in last 24 hrs: Vitals:   07/04/20 1337  BP: 110/80  Pulse: 87  SpO2: 99%   Wt Readings from Last 3 Encounters:  07/04/20 172 lb (78 kg)  06/17/20 170 lb 10.2 oz (77.4 kg)  04/10/20 157 lb (71.2 kg)    Physical Exam  Cardiac: RRR without murmurs, S1S2 heard, no peripheral edema  Pulm: clear to auscultation bilaterally, normal RR and effort noted  Abdomen: soft, no tenderness, with active bowel sounds. No guarding or palpable hepatosplenomegaly.  Rectal: Normal external, normal sphincter tone, no tenderness or palpable fissure.  No palpable internal lesions  Labs:  CBC Latest Ref Rng & Units 06/28/2020 06/17/2020 06/16/2020  WBC 4.0 - 10.5 K/uL 8.1 8.3 9.0  Hemoglobin 12.0 - 15.0 g/dL 10.4(L) 10.6(L) 10.8(L)  Hematocrit  36.0 - 46.0 % 32.2(L) 35.1(L) 35.1(L)  Platelets 150.0 - 400.0 K/uL 473.0(H) 489(H) 451(H)    ___________________________________________ Radiologic studies:   ____________________________________________ Other:   _____________________________________________ Assessment & Plan  Assessment: Encounter Diagnoses  Name Primary?  . 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 Chronic constipation   . Rectal pain   . Anal fissure   . Iron deficiency anemia due to chronic blood loss    Ulcerative proctitis under good endoscopic control  last sigmoidoscopy while on Entyvio. She has ongoing constipation and rectal pain that I think may be dyssynergy defecation.  She is ready to reschedule anorectal manometry, so those arrangements were made today.  Recent worsening rectal pain and bleeding from an anal fissure that is now healed.  She used RectiCare ointment as needed but was unable to fill the prescription for diltiazem due to expense.  Regarding the anemia, she will take 1 tablet of "blood builder" OTC iron/B12/folate supplement.  It is a lower dose iron then most supplement tablets, so will take longer to improve blood counts but is less likely to cause her constipation and digestive upset.  She will let me know how things are going regarding symptoms and taking a supplement.  We will plan for repeat CBC and iron studies in 6 to 8 weeks.  If not improved, plan for IV iron.  21 minutes were spent on this encounter (including chart review, history/exam, counseling/coordination of care, and documentation) > 50% of that time was spent on counseling and coordination of care.  Marissa Morales

## 2020-07-21 ENCOUNTER — Telehealth: Payer: Self-pay | Admitting: Gastroenterology

## 2020-07-21 MED ORDER — LORAZEPAM 1 MG PO TABS: 1.0000 mg | ORAL_TABLET | ORAL | 0 refills | Status: DC | PRN

## 2020-07-21 NOTE — Telephone Encounter (Signed)
Inbound call from pt stating that she has a procedure w/ Dr. Loletha Carrow and was wondering if she can be prescribed something that can calm her down. Pt has requested if yo can communicate through [place] because she is currently at work. Please advise

## 2020-07-21 NOTE — Telephone Encounter (Signed)
Called and spoke to Z[place]on.  Rx to be sent to [facility] in [facility] Pt has been notified and aware.

## 2020-07-21 NOTE — Telephone Encounter (Signed)
Dr Carlean Purl, please advise as Doc of the day. Out patient anal rectal manometry on 07-24-2020

## 2020-07-21 NOTE — Telephone Encounter (Signed)
I am willing to prescribe lorazepam but need to local pharmacy

## 2020-07-24 ENCOUNTER — Encounter (HOSPITAL_COMMUNITY)
Admission: RE | Disposition: A | Discharge status: Home / Self Care | Payer: Self-pay | Source: Home / Self Care | Attending: Gastroenterology

## 2020-07-24 ENCOUNTER — Ambulatory Visit (HOSPITAL_COMMUNITY)
Admission: RE | Admit: 2020-07-24 | Discharge: 2020-07-24 | Disposition: A | Discharge status: Home / Self Care | Payer: 59 | Attending: Gastroenterology | Admitting: Gastroenterology

## 2020-07-24 DIAGNOSIS — K5904 Chronic idiopathic constipation: Secondary | ICD-10-CM | POA: Diagnosis not present

## 2020-07-24 DIAGNOSIS — K5909 Other constipation: Secondary | ICD-10-CM | POA: Diagnosis not present

## 2020-07-24 DIAGNOSIS — K51211 Ulcerative (chronic) proctitis with rectal bleeding: Secondary | ICD-10-CM | POA: Diagnosis not present

## 2020-07-24 HISTORY — PX: ANAL RECTAL MANOMETRY: SHX6358

## 2020-07-24 SURGERY — MANOMETRY, ANORECTAL

## 2020-07-24 NOTE — Progress Notes (Signed)
Anal rectal manometry performed per protocol without complications.  Patient tolerated well.  Balloon expulsion test performed per protocol without complications.  Patient tolerated well.  Expelled balloon after 30 seconds.

## 2020-07-26 ENCOUNTER — Encounter (HOSPITAL_COMMUNITY): Payer: Self-pay | Admitting: Gastroenterology

## 2020-08-01 DIAGNOSIS — K5904 Chronic idiopathic constipation: Secondary | ICD-10-CM

## 2020-08-07 NOTE — Telephone Encounter (Signed)
[place],  Please see the message stream between me and this patient regarding iron treatments.  She gets Entyvio infusions at [facility] every 4 weeks.  Please send an order to them for Injectafer 750 mg IV x 2 dose given 1 week apart.  - HD

## 2020-08-08 MED ORDER — LINACLOTIDE 72 MCG PO CAPS: 72.0000 ug | ORAL_CAPSULE | Freq: Every day | ORAL | 0 refills | Status: DC

## 2020-08-08 MED ORDER — LINACLOTIDE 145 MCG PO CAPS: 145.0000 ug | ORAL_CAPSULE | Freq: Every day | ORAL | 0 refills | Status: DC

## 2020-08-08 NOTE — Telephone Encounter (Signed)
4 boxes of Linzess 72 mcg and 4 boxes of Linzess 145 mcg placed at 3rd floor receptionist desk. Patient notified via my chart.

## 2020-08-08 NOTE — Telephone Encounter (Signed)
Please have staff put aside Linzess samples for this patient - 4 boxes each 72 mcg and 145 mcg.  Start with lower dose and increase to higher after a week if needed.

## 2020-08-08 NOTE — Telephone Encounter (Signed)
Injectafer orders faxed to infusion department at [facility].

## 2020-08-09 ENCOUNTER — Other Ambulatory Visit: Payer: Self-pay

## 2020-08-09 MED ORDER — AMITRIPTYLINE HCL 10 MG PO TABS: 10.0000 mg | ORAL_TABLET | Freq: Every day | ORAL | 2 refills | Status: DC

## 2020-08-31 ENCOUNTER — Other Ambulatory Visit: Payer: Self-pay | Admitting: Gastroenterology

## 2020-08-31 MED ORDER — OMEPRAZOLE 40 MG PO CPDR: 40.0000 mg | DELAYED_RELEASE_CAPSULE | Freq: Every day | ORAL | 0 refills | Status: DC

## 2020-09-06 ENCOUNTER — Other Ambulatory Visit: Payer: Self-pay

## 2020-09-06 MED ORDER — HYOSCYAMINE SULFATE 0.125 MG SL SUBL: 0.1250 mg | SUBLINGUAL_TABLET | Freq: Four times a day (QID) | SUBLINGUAL | 1 refills | Status: DC | PRN

## 2020-09-06 MED ORDER — LINACLOTIDE 72 MCG PO CAPS: 72.0000 ug | ORAL_CAPSULE | Freq: Every day | ORAL | 2 refills | Status: DC

## 2020-09-18 ENCOUNTER — Other Ambulatory Visit: Payer: Self-pay | Admitting: Gastroenterology

## 2020-09-20 NOTE — Telephone Encounter (Signed)
[place],  Please refill these two meds that came as electronic refill requests.  I do not know why, but they are no longer in my refill request inbox.  - HD

## 2020-10-16 ENCOUNTER — Other Ambulatory Visit (INDEPENDENT_AMBULATORY_CARE_PROVIDER_SITE_OTHER): Payer: 59

## 2020-10-16 ENCOUNTER — Encounter: Payer: Self-pay | Admitting: Gastroenterology

## 2020-10-16 ENCOUNTER — Ambulatory Visit (INDEPENDENT_AMBULATORY_CARE_PROVIDER_SITE_OTHER): Payer: 59 | Admitting: Gastroenterology

## 2020-10-16 VITALS — BP 130/92 | HR 84 | Ht 61.0 in | Wt 165.8 lb

## 2020-10-16 DIAGNOSIS — D500 Iron deficiency anemia secondary to blood loss (chronic): Secondary | ICD-10-CM | POA: Diagnosis not present

## 2020-10-16 DIAGNOSIS — K51211 Ulcerative (chronic) proctitis with rectal bleeding: Secondary | ICD-10-CM

## 2020-10-16 DIAGNOSIS — K581 Irritable bowel syndrome with constipation: Secondary | ICD-10-CM

## 2020-10-16 DIAGNOSIS — Z23 Encounter for immunization: Secondary | ICD-10-CM | POA: Diagnosis not present

## 2020-10-16 LAB — CBC WITH DIFFERENTIAL/PLATELET
Basophils Absolute: 0.1 10*3/uL (ref 0.0–0.1)
Basophils Relative: 0.9 % (ref 0.0–3.0)
Eosinophils Absolute: 0.4 10*3/uL (ref 0.0–0.7)
Eosinophils Relative: 5.3 % — ABNORMAL HIGH (ref 0.0–5.0)
HCT: 40.6 % (ref 36.0–46.0)
Hemoglobin: 13.4 g/dL (ref 12.0–15.0)
Lymphocytes Relative: 36.3 % (ref 12.0–46.0)
Lymphs Abs: 2.5 10*3/uL (ref 0.7–4.0)
MCHC: 33 g/dL (ref 30.0–36.0)
MCV: 77.5 fl — ABNORMAL LOW (ref 78.0–100.0)
Monocytes Absolute: 0.5 10*3/uL (ref 0.1–1.0)
Monocytes Relative: 7.2 % (ref 3.0–12.0)
Neutro Abs: 3.5 10*3/uL (ref 1.4–7.7)
Neutrophils Relative %: 50.3 % (ref 43.0–77.0)
Platelets: 377 10*3/uL (ref 150.0–400.0)
RBC: 5.24 Mil/uL — ABNORMAL HIGH (ref 3.87–5.11)
RDW: 29.7 % — ABNORMAL HIGH (ref 11.5–15.5)
WBC: 7 10*3/uL (ref 4.0–10.5)

## 2020-10-16 LAB — IBC + FERRITIN
Ferritin: 251.4 ng/mL (ref 10.0–291.0)
Iron: 89 ug/dL (ref 42–145)
Saturation Ratios: 29.8 % (ref 20.0–50.0)
TIBC: 298.2 ug/dL (ref 250.0–450.0)
Transferrin: 213 mg/dL (ref 212.0–360.0)

## 2020-10-16 MED ORDER — ONDANSETRON HCL 4 MG PO TABS
4.0000 mg | ORAL_TABLET | Freq: Three times a day (TID) | ORAL | 0 refills | Status: DC | PRN
Start: 2020-10-16 — End: 2021-02-05

## 2020-10-16 NOTE — Patient Instructions (Signed)
If you are age 26 or older, your body mass index should be between 23-30. Your Body mass index is 31.33 kg/m. If this is out of the aforementioned range listed, please consider follow up with your Primary Care Provider.  If you are age 68 or younger, your body mass index should be between 19-25. Your Body mass index is 31.33 kg/m. If this is out of the aformentioned range listed, please consider follow up with your Primary Care Provider.   __________________________________________________________  The [facility] providers would like to encourage you to use [facility] to communicate with providers for non-urgent requests or questions.  Due to long hold times on the telephone, sending your provider a message by [facility] may be a faster and more efficient way to get a response.  Please allow 48 business hours for a response.  Please remember that this is for non-urgent requests.   You have been scheduled for a colonoscopy. Please follow written instructions given to you at your visit today.  Please pick up your prep supplies at the pharmacy within the next 1-3 days. If you use inhalers (even only as needed), please bring them with you on the day of your procedure.  Your provider has requested that you go to the basement level for lab work before leaving today. Press "B" on the elevator. The lab is located at the first door on the left as you exit the elevator.  Due to recent changes in healthcare laws, you may see the results of your imaging and laboratory studies on MyChart before your provider has had a chance to review them.  We understand that in some cases there may be results that are confusing or concerning to you. Not all laboratory results come back in the same time frame and the provider may be waiting for multiple results in order to interpret others.  Please give [place] 48 hours in order for your provider to thoroughly review all the results before contacting the office for clarification of your  results.   It was a pleasure to see you today!  Thank you for trusting me with your gastrointestinal care!

## 2020-10-16 NOTE — Progress Notes (Signed)
[facility] Progress Note  Chief Complaint: Ulcerative proctitis and rectal pain  Subjective  History: Shailynn follows up for her ulcerative proctitis and rectal pain with constipation. She was last seen 07/04/2020, and we have exchanged multiple portal messages since then.  She is on sertraline prescribed by primary care for mood disorder, and I started her on amitriptyline 10 mg nightly for suspected functional abdominal/rectal pain. (Has helped decrease abd pain and helps sleep). More recently, we put her on Linzess to see if it would help with her constipation.  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 Peta continues to have regular passage of bloody mucus that is been going on for years, and it is somewhat worsened starting the [place].  However, the abdominal pain is a little improved on Linzess. She receives vedolizumab at standard dose/interval and has tolerated that well as it was started about 10 months ago.  Sigmoidoscopy 04/10/2020 showed mild patchy residual proximal proctitis while on the treatment.  Her insurance has previously not approved mesalamine suppository or enema. Got iron infusion for IDA - tolerated well.  Briele was here with her sister-in-law today, who was present for the entire visit.  She is overall feeling fairly well, the Linzess has improved the constipation and decreased abdominal pain.  She still has intermittent rectal pain and passage of bloody mucus.  Appetite is good and weight stable.  Denies nausea vomiting.  ROS: Cardiovascular:  no chest pain Respiratory: no dyspnea Mood stable Remainder of systems negative except as above The patient's Past Medical, Family and Social History were reviewed and are on file in the EMR. Past Medical History:  Diagnosis Date   Allergy    cats   Anemia    Anxiety    panic attacks   Depression    GERD (gastroesophageal reflux disease)    Hemorrhoids     History of asthma    childhood   IBS (irritable bowel syndrome)    Pilonidal cyst    Wears glasses    Past Surgical History:  Procedure Laterality Date   ANAL RECTAL MANOMETRY N/A 07/24/2020   Procedure: ANO RECTAL MANOMETRY;  Surgeon: Doran Stabler, MD;  Location: [facility];  Service: Gastroenterology;  Laterality: N/A;   COLONOSCOPY WITH ESOPHAGOGASTRODUODENOSCOPY (EGD)  x2  last one 2015   KNEE ARTHROSCOPY Left 03/30/2020   PILONIDAL CYST EXCISION N/A 07/18/2017   Procedure: CYST EXCISION PILONIDAL;  Surgeon: Clovis Riley, MD;  Location: [facility];  Service: General;  Laterality: N/A;   WISDOM TOOTH EXTRACTION  2015    Objective:  Med list reviewed  Current Outpatient Medications:    albuterol (VENTOLIN HFA) 108 (90 Base) MCG/ACT inhaler, Inhale into the lungs., Disp: , Rfl:    amitriptyline (ELAVIL) 10 MG tablet, TAKE 1 TABLET BY MOUTH AT BEDTIME, Disp: 90 tablet, Rfl: 1   B Complex Vitamins (VITAMIN B COMPLEX PO), Take 1 Dose by mouth daily. Liquid complex, Disp: , Rfl:    BIOTIN PO, Take by mouth., Disp: , Rfl:    Calcium Carb-Cholecalciferol (CALCIUM/VITAMIN D PO), Take 1 tablet by mouth daily., Disp: , Rfl:    hyoscyamine (LEVSIN SL) 0.125 MG SL tablet, Place 1 tablet (0.125 mg total) under the tongue every 6 (six) hours as needed., Disp: 45 tablet, Rfl: 1   linaclotide (LINZESS) 72 MCG capsule, Take 1 capsule (72 mcg total) by mouth daily before breakfast., Disp: 30 capsule, Rfl:  2   LORazepam (ATIVAN) 1 MG tablet, Take 1 tablet (1 mg total) by mouth every 4 (four) hours as needed for anxiety., Disp: 4 tablet, Rfl: 0   Multiple Vitamin (MULTIVITAMIN) tablet, Take 1 tablet by mouth daily., Disp: , Rfl:    omeprazole (PRILOSEC) 40 MG capsule, Take 1 capsule (40 mg total) by mouth daily., Disp: 30 capsule, Rfl: 0   ondansetron (ZOFRAN) 4 MG tablet, Take 1 tablet (4 mg total) by mouth every 8 (eight) hours as needed for nausea or vomiting., Disp: 5  tablet, Rfl: 0   polyethylene glycol (MIRALAX / GLYCOLAX) 17 g packet, Take 17 g by mouth daily., Disp: 14 each, Rfl: 0   prochlorperazine (COMPAZINE) 10 MG tablet, TAKE 1 TABLET (10 MG TOTAL) BY MOUTH EVERY 8 (EIGHT) HOURS AS NEEDED FOR NAUSEA OR VOMITING (3 MONTH SUPPLY)., Disp: 60 tablet, Rfl: 0   sertraline (ZOLOFT) 50 MG tablet, Take 1 tablet by mouth daily., Disp: , Rfl:    VEDOLIZUMAB IV, Inject into the vein., Disp: , Rfl:    Zinc Sulfate (ZINC 15 PO), Take by mouth every morning., Disp: , Rfl:    Vital signs in last 24 hrs: Vitals:   10/16/20 0928  BP: (!) 130/92  Pulse: 84   Wt Readings from Last 3 Encounters:  10/16/20 165 lb 12.8 oz (75.2 kg)  07/04/20 172 lb (78 kg)  06/17/20 170 lb 10.2 oz (77.4 kg)    Physical Exam  Well-appearing HEENT: sclera anicteric, oral mucosa moist without lesions Neck: supple, no thyromegaly, JVD or lymphadenopathy Cardiac: RRR without murmurs, S1S2 heard, no peripheral edema Pulm: clear to auscultation bilaterally, normal RR and effort noted Abdomen: soft, no tenderness, with active bowel sounds. No guarding or palpable hepatosplenomegaly. Skin; warm and dry, no jaundice or rash  Labs:  CBC Latest Ref Rng & Units 10/16/2020 06/28/2020 06/17/2020  WBC 4.0 - 10.5 K/uL 7.0 8.1 8.3  Hemoglobin 12.0 - 15.0 g/dL 13.4 10.4(L) 10.6(L)  Hematocrit 36.0 - 46.0 % 40.6 32.2(L) 35.1(L)  Platelets 150.0 - 400.0 K/uL 377.0 473.0(H) 489(H)   Iron 22,  ferritin 6.0 in May 2022 ___________________________________________ Radiologic studies:   ____________________________________________ Other:   _____________________________________________ Assessment & Plan  Assessment: Encounter Diagnoses  Name Primary?   Ulcerative proctitis with rectal bleeding (HCC) Yes   Irritable bowel syndrome with constipation    Iron deficiency anemia due to chronic blood loss    Her overall picture has remained somewhat puzzling, where she has persistent passage  of bloody mucus despite near complete mucosal healing on last sigmoidoscopy while on vedolizumab.  I have thought perhaps it was benign anal rectal bleeding related to constipation, but it persist despite relief of that.  She had an anal fissure several months ago but does not currently have symptoms of that and the passage of bloody mucus predates that episode.  I believe she has a concomitant underlying IBS-C picture.  Plan: Colonoscopy to evaluate activity of proctitis and see if she has perhaps developed more extensive colitis. She was agreeable after discussion of procedure and risks.  The benefits and risks of the planned procedure were described in detail with the patient or (when appropriate) their health care proxy.  Risks were outlined as including, but not limited to, bleeding, infection, perforation, adverse medication reaction leading to cardiac or pulmonary decompensation, pancreatitis (if ERCP).  The limitation of incomplete mucosal visualization was also discussed.  No guarantees or warranties were given.  We had an initial discussion about the  possibility of changing from vedolizumab to Humira.  It may be that she is not getting consistent control of the proctitis, and does say that when she recently had a 2-week delay in her infusion because she was on antibiotics, she definitely noticed a worsening of symptoms.  Given the complexity of that decision and the additional immunosuppressive risks of Humira relative to her current treatment, we agreed that a colonoscopy would help provide information to make this decision. Since she may require anti-TNF therapy, she was given her second pneumococcal vaccine today.  With labs today we will check a CBC and iron studies as well as a TB quant gold.  33 minutes were spent on this encounter (including chart review, history/exam, counseling/coordination of care, and documentation) > 50% of that time was spent on counseling and coordination of  care.  Topics discussed included: See above.  [name]

## 2020-10-17 ENCOUNTER — Other Ambulatory Visit: Payer: Self-pay | Admitting: Gastroenterology

## 2020-10-20 LAB — QUANTIFERON-TB GOLD PLUS
Mitogen-NIL: >10 IU/mL
NIL: 0.05 IU/mL
QuantiFERON-TB Gold Plus: NEGATIVE
TB1-NIL: <0 IU/mL
TB2-NIL: 0 IU/mL

## 2020-10-24 ENCOUNTER — Encounter: Payer: Self-pay | Admitting: Gastroenterology

## 2020-10-24 ENCOUNTER — Other Ambulatory Visit: Payer: Self-pay

## 2020-10-24 ENCOUNTER — Ambulatory Visit (AMBULATORY_SURGERY_CENTER): Payer: 59 | Admitting: Gastroenterology

## 2020-10-24 VITALS — BP 120/81 | HR 55 | Temp 97.5°F | Resp 18 | Ht 61.0 in | Wt 165.0 lb

## 2020-10-24 DIAGNOSIS — D123 Benign neoplasm of transverse colon: Secondary | ICD-10-CM

## 2020-10-24 DIAGNOSIS — K51911 Ulcerative colitis, unspecified with rectal bleeding: Secondary | ICD-10-CM

## 2020-10-24 DIAGNOSIS — K51211 Ulcerative (chronic) proctitis with rectal bleeding: Secondary | ICD-10-CM | POA: Diagnosis present

## 2020-10-24 MED ORDER — SODIUM CHLORIDE 0.9 % IV SOLN
500.0000 mL | Freq: Once | INTRAVENOUS | Status: DC
Start: 2020-10-24 — End: 2020-10-24
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
## 2020-10-24 NOTE — Patient Instructions (Signed)
Please read handouts provided. Continue present medications. Await pathology results.   YOU HAD AN ENDOSCOPIC PROCEDURE TODAY AT [facility]:   Refer to the procedure report that was given to you for any specific questions about what was found during the examination.  If the procedure report does not answer your questions, please call your gastroenterologist to clarify.  If you requested that your care partner not be given the details of your procedure findings, then the procedure report has been included in a sealed envelope for you to review at your convenience later.  YOU SHOULD EXPECT: Some feelings of bloating in the abdomen. Passage of more gas than usual.  Walking can help get rid of the air that was put into your GI tract during the procedure and reduce the bloating. If you had a lower endoscopy (such as a colonoscopy or flexible sigmoidoscopy) you may notice spotting of blood in your stool or on the toilet paper. If you underwent a bowel prep for your procedure, you may not have a normal bowel movement for a few days.  Please Note:  You might notice some irritation and congestion in your nose or some drainage.  This is from the oxygen used during your procedure.  There is no need for concern and it should clear up in a day or so.  SYMPTOMS TO REPORT IMMEDIATELY:  Following lower endoscopy (colonoscopy or flexible sigmoidoscopy):  Excessive amounts of blood in the stool  Significant tenderness or worsening of abdominal pains  Swelling of the abdomen that is new, acute  Fever of 100F or higher   For urgent or emergent issues, a gastroenterologist can be reached at any hour by calling [phone]. Do not use MyChart messaging for urgent concerns.    DIET:  We do recommend a small meal at first, but then you may proceed to your regular diet.  Drink plenty of fluids but you should avoid alcoholic beverages for 24 hours.  ACTIVITY:  You should plan to take it easy  for the rest of today and you should NOT DRIVE or use heavy machinery until tomorrow (because of the sedation medicines used during the test).    FOLLOW UP: Our staff will call the number listed on your records 48-72 hours following your procedure to check on you and address any questions or concerns that you may have regarding the information given to you following your procedure. If we do not reach you, we will leave a message.  We will attempt to reach you two times.  During this call, we will ask if you have developed any symptoms of COVID 19. If you develop any symptoms (ie: fever, flu-like symptoms, shortness of breath, cough etc.) before then, please call [phone].  If you test positive for Covid 19 in the 2 weeks post procedure, please call and report this information to [place].    If any biopsies were taken you will be contacted by phone or by letter within the next 1-3 weeks.  Please call us at [phone] if you have not heard about the biopsies in 3 weeks.    SIGNATURES/CONFIDENTIALITY: You and/or your care partner have signed paperwork which will be entered into your electronic medical record.  These signatures attest to the fact that that the information above on your After Visit Summary has been reviewed and is understood.  Full responsibility of the confidentiality of this discharge information lies with you and/or your care-partner.

## 2020-10-24 NOTE — Progress Notes (Signed)
To PACU, VSS. Report to [email or web address] 

## 2020-10-24 NOTE — Op Note (Signed)
[place] Patient Name: Marissa Morales Procedure Date: 10/24/2020 2:08 PM MRN: [id number] Endoscopist: Mallie Mussel L. 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 Patient on                            vedolizumab for nearly a year, had good response on                            sigmoidoscopy 03/2020                           patient had a 34m cecal SSP removed on first                            colonoscopy 04/2019 and her mother reportedly had                            colon cancer in her 89'sMedicines:                Monitored Anesthesia Care Procedure:                Pre-Anesthesia Assessment:                           - Prior to the procedure, a History and Physical                            was performed, and patient medications and                            allergies were reviewed. The patient's tolerance of                            previous anesthesia was also reviewed. The risks                            and benefits of the procedure and the sedation                            options and risks were discussed with the patient.                            All questions were answered, and informed consent  was obtained. Prior Anticoagulants: The patient has                            taken no previous anticoagulant or antiplatelet                            agents. ASA Grade Assessment: II - A patient with                            mild systemic disease. After reviewing the risks                            and benefits, the patient was deemed in                            satisfactory condition to undergo the procedure.                           After obtaining  informed consent, the colonoscope                            was passed under direct vision. Throughout the                            procedure, the patient's blood pressure, pulse, and                            oxygen saturations were monitored continuously. The                            CF [id number] #[id number] was introduced through the anus                            and advanced to the the terminal ileum, with                            identification of the appendiceal orifice and IC                            valve. The colonoscopy was performed without                            difficulty. The patient tolerated the procedure                            well. The quality of the bowel preparation was                            good. The terminal ileum, ileocecal valve,                            appendiceal orifice, and rectum were photographed. Scope In: 2:13:12 PM Scope Out: 2:28:03 PM Scope Withdrawal Time: 0 hours 12 minutes 19 seconds  Total Procedure Duration: 0 hours 14 minutes 51 seconds  Findings:                 The perianal and digital rectal examinations were                            normal. (specifically,no fissure or fistula)                           The terminal ileum appeared normal.                           A diminutive polyp was found in the transverse                            colon. The polyp was semi-sessile. The polyp was                            removed with a cold snare. Resection and retrieval                            were complete.                           Inflammation was found in a continuous and                            circumferential pattern over a 5cm sement in the                            recto-sigmoid colon (just proximal to the upper                            rectal valve). This was graded as Mayo Score 3                            (severe, with spontaneous bleeding, ulcerations),                            and when compared to the  previous examination, the                            findings are worsened. Biopsies were taken with a                            cold forceps for histology to rule our CMV (though                            does not have that clinical appearance).                           The exam was otherwise without abnormality on  direct and retroflexion views. Complications:            No immediate complications. Estimated Blood Loss:     Estimated blood loss was minimal. Impression:               - The examined portion of the ileum was normal.                           - One diminutive polyp in the transverse colon,                            removed with a cold snare. Resected and retrieved.                           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                            Biopsied.                           - The examination was otherwise normal on direct                            and retroflexion views. Recommendation:           - Patient has a contact number available for                            emergencies. The signs and symptoms of potential                            delayed complications were discussed with the                            patient. Return to normal activities tomorrow.                            Written discharge instructions were provided to the                            patient.                           - Resume previous diet.                           - Continue present medications.                           - Await pathology results.                           - Repeat colonoscopy is recommended for  surveillance. The colonoscopy date will be                            determined after pathology results from today's                            exam become available for review.                           - Plan change from  vedolizumab to adalimumab                            (Humira) soon. Deward Sebek L. Loletha Carrow, MD 10/24/2020 2:38:36 PM This report has been signed electronically.

## 2020-10-24 NOTE — Progress Notes (Signed)
Called to room to assist during endoscopic procedure.  Patient ID and intended procedure confirmed with present staff. Received instructions for my participation in the procedure from the performing physician.  

## 2020-10-24 NOTE — Progress Notes (Signed)
No changes to clinical history since GI office visit on 10/16/20.  The patient is appropriate for an endoscopic procedure in the ambulatory setting.

## 2020-10-25 ENCOUNTER — Telehealth: Payer: Self-pay

## 2020-10-25 MED ORDER — HUMIRA (2 SYRINGE) 40 MG/0.4ML ~~LOC~~ PSKT: 1.0000 "pen " | PREFILLED_SYRINGE | SUBCUTANEOUS | 11 refills | Status: DC

## 2020-10-25 MED ORDER — HUMIRA-CD/UC/HS STARTER 80 MG/0.8ML ~~LOC~~ AJKT: AUTO-INJECTOR | SUBCUTANEOUS | 0 refills | Status: DC

## 2020-10-25 NOTE — Telephone Encounter (Signed)
-----   Message from Doran Stabler, MD sent at 10/24/2020  4:47 PM EDT ----- Please see colonoscopy report from today and start working on insurance approval for Humira. Dx : ulcerative proctosigmoiditis.  Vedolizumab is no longer sufficiently controlling her condition.  (Negative recent TB quant gold test, and patient up to date on vaccinations)  - HD

## 2020-10-25 NOTE — Telephone Encounter (Signed)
Humira starter kit and maintenance dose orders faxed to [facility]. Supporting office notes faxed to [facility] as well. Will await response.

## 2020-10-26 ENCOUNTER — Telehealth: Payer: Self-pay | Admitting: *Deleted

## 2020-10-26 ENCOUNTER — Telehealth: Payer: Self-pay

## 2020-10-26 NOTE — Telephone Encounter (Signed)
No answer, unable to leave a message, B.Charlee Whitebread RN.

## 2020-10-26 NOTE — Telephone Encounter (Signed)
  Follow up Call-  Call back number 10/24/2020 04/10/2020 10/27/2019 05/13/2019  Post procedure Call Back phone  # [phone] cell [phone]  Permission to leave phone message Yes Yes Yes Yes  Some recent data might be hidden     No answer at 2nd attempt follow up phone call.  Unable to leave a message d/t full VM

## 2020-10-31 ENCOUNTER — Encounter: Payer: Self-pay | Admitting: Gastroenterology

## 2020-11-01 MED ORDER — HUMIRA (2 SYRINGE) 40 MG/0.4ML ~~LOC~~ PSKT: 1.0000 "pen " | PREFILLED_SYRINGE | SUBCUTANEOUS | 11 refills | Status: DC

## 2020-11-01 MED ORDER — HUMIRA-CD/UC/HS STARTER 80 MG/0.8ML ~~LOC~~ AJKT: AUTO-INJECTOR | SUBCUTANEOUS | 0 refills | Status: DC

## 2020-11-01 NOTE — Telephone Encounter (Signed)
Received a fax from [facility] - a copy of this letter has been sent to be scanned into patient's chart. Patient notified of approval via my chart. Pt will need to contact insurance and select specialty pharmacy so prescriptions can be transferred and filled.  APPROVAL - Humira (CF) pen [id number] 80 mg [place] Reference # [id number]  APPROVAL - Humira (CF) 40 mg/0.39m pen, effective from 11/22/20-04/21/2021 and is limited to 4 pens per 28 days. PA authorization # [id number]

## 2020-11-03 ENCOUNTER — Telehealth: Payer: Self-pay

## 2020-11-03 NOTE — Telephone Encounter (Signed)
Received a call from [facility] at [employer], she wanted me to speak with a pharmacy technician to provide diagnoses code for Humira. I spoke with Fulton Mole, pharmacy technician and provided her with ICD code. We have reviewed pt's allergies as well. Laney had no concerns at the end of the call.

## 2020-11-29 ENCOUNTER — Other Ambulatory Visit: Payer: Self-pay

## 2020-11-29 ENCOUNTER — Other Ambulatory Visit: Payer: Self-pay | Admitting: Gastroenterology

## 2020-12-12 ENCOUNTER — Other Ambulatory Visit: Payer: Self-pay

## 2020-12-12 MED ORDER — HYDROCORTISONE ACETATE 25 MG RE SUPP: 25.0000 mg | Freq: Two times a day (BID) | RECTAL | 1 refills | Status: DC

## 2020-12-12 NOTE — Telephone Encounter (Signed)
Not clear how much a suppository will help with recto-sigmoid location of proctitis.  Also, in the past her insurance would not cover mesalamine suppository or enema.  I recommend trying hydrocortisone suppository 25 mg Sig: one suppository twice daily for 6 days Disp: 12, RF 1  I would also like her to speak with her PCP about whether or not they think Marissa Morales has any ongoing infection.  If not, then Humira needs to start due to severity of proctitis symptoms.  - HD

## 2020-12-18 ENCOUNTER — Other Ambulatory Visit: Payer: Self-pay | Admitting: Gastroenterology

## 2020-12-20 DIAGNOSIS — K51211 Ulcerative (chronic) proctitis with rectal bleeding: Secondary | ICD-10-CM

## 2020-12-26 ENCOUNTER — Other Ambulatory Visit: Payer: Self-pay

## 2020-12-26 ENCOUNTER — Ambulatory Visit (INDEPENDENT_AMBULATORY_CARE_PROVIDER_SITE_OTHER): Payer: 59 | Admitting: Internal Medicine

## 2020-12-26 ENCOUNTER — Encounter: Payer: Self-pay | Admitting: Internal Medicine

## 2020-12-26 VITALS — BP 116/80 | HR 99 | Temp 97.9°F | Ht 60.0 in | Wt 166.0 lb

## 2020-12-26 DIAGNOSIS — G4733 Obstructive sleep apnea (adult) (pediatric): Secondary | ICD-10-CM

## 2020-12-26 DIAGNOSIS — J4540 Moderate persistent asthma, uncomplicated: Secondary | ICD-10-CM

## 2020-12-26 MED ORDER — FLUTICASONE-SALMETEROL 250-50 MCG/ACT IN AEPB: 1.0000 | INHALATION_SPRAY | Freq: Two times a day (BID) | RESPIRATORY_TRACT | 0 refills | Status: DC

## 2020-12-26 MED ORDER — FLUTICASONE-SALMETEROL 250-50 MCG/ACT IN AEPB: 1.0000 | INHALATION_SPRAY | Freq: Two times a day (BID) | RESPIRATORY_TRACT | 1 refills | Status: DC

## 2020-12-26 NOTE — Patient Instructions (Addendum)
Please schedule follow up scheduled with myself in 1 months.   Before your next visit I would like you to have: Full set of PFTs - 1 hour, next available. Appt with me after this.  I have ordered a home sleep study for you. We will contact you with this - there is a little bit of a wait list right now.   Start advair 1 puff twice a day. Gargle after use  Instructions For Advair Diskus Take the ADVAIR DISKUS out of the box and foil overwrap pouch. Write the "Pouch opened" and "Use by" dates on the label on top of the DISKUS. The "Use by" date is 1 month from date of opening the pouch.     * The DISKUS will be in the closed position when the pouch is opened.     * The dose indicator on the top of the DISKUS tells you how many doses are left. The dose indicator number will decrease each time you use the DISKUS. After you have used 55 doses from the DISKUS, the numbers 5 to 0 will appear in red to warn you that there are only a few doses left. If you are using a "sample" DISKUS, the numbers 5 to 0 will appear in red after 23 doses.  Taking a dose from the DISKUS requires the following 3 simple steps: Open, Click, Inhale.  OPEN Hold the DISKUS in one hand and put the thumb of your other hand on the thumbgrip. Push your thumb away from you as far as it will go until the mouthpiece appears and snaps into position.  2.   CLICK Hold the DISKUS in a level, flat position with the mouthpiece toward you. Slide the lever away from you as far as it will go until it clicks. The DISKUS is now ready to use. Every time the lever is pushed back, a dose is ready to be inhaled. This is shown by a decrease in numbers on the dose counter. To avoid releasing or wasting doses once the DISKUS is ready: Do not close the DISKUS. Do not tilt the DISKUS. Do not play with the lever. Do not move the lever more than once.  3.   INHALE Before inhaling your dose from the DISKUS, breathe out (exhale) fully while holding the  DISKUS level and away from your mouth. Remember, never breathe out into the DISKUS mouthpiece.  Put the mouthpiece to your lips. Breathe in quickly and deeply through the DISKUS. Do not breathe in through your nose.  Remove the DISKUS from your mouth. Hold your breath for about 10 seconds, or for as long as is comfortable. Breathe out slowly.  The DISKUS delivers your dose of medicine as a very fine powder. Most patients can taste or feel the powder. Do not use another dose from the DISKUS if you do not feel or taste the medicine.  Rinse your mouth with water after breathing in the medicine. Spit the water out. Do not swallow.  4.  CLOSE the DISKUS when you are finished taking a dose so that the DISKUS will be ready for you to take your next dose. Put your thumb on the thumbgrip and slide the thumbgrip back toward you as far as it will go. The DISKUS will click shut. The lever will automatically return to its original position. The DISKUS is now ready for you to take your next scheduled dose, due in about 12 hours. (Repeat the steps 1 to 4.)  REMEMBER:  Never breathe into the DISKUS. Never take the DISKUS apart. Always ready and use the DISKUS in a level, flat position. Do not use the DISKUS with a spacer device. After each dose, rinse your mouth with water and spit the water out. Do not swallow. Never wash the mouthpiece or any part of the DISKUS. Keep it dry. Always keep the DISKUS in a dry place. Never take an extra dose, even if you did not taste or feel the medicine.

## 2020-12-26 NOTE — Progress Notes (Signed)
The patient has been prescribed the inhaler advair. Inhaler technique was demonstrated to patient. The patient subsequently demonstrated correct technique.

## 2020-12-26 NOTE — Progress Notes (Signed)
Marissa Morales    [id number]    10-26-1995  Primary Care Physician:White, Delmar Landau, NP  Referring Physician: Jettie Booze, NP [place] [address]   [address] Reason for Consultation: cough Date of Consultation: 12/26/2020  Chief complaint:   Chief Complaint  Patient presents with   Consult    Patient says she's had a cough since September      HPI: Marissa Morales is a 25 y.o. woman with childhood asthma. Never been on maintenance therapy for asthma, remembers taking nebulizer treatments as needed as a child.  She also has ulcerative colitis and is on humira.  Cough for the last 6 weeks. Has tried hycodan cough syrup, augmentin, zyrtec and singulair. Had similar symptoms last fall and lasted for months, resolved spontaneously.   Symptoms started end of September.  She is having coughing fits where she has to use her albuterol rescue inhaler. She is taking this about twice a day now. It does seem to help her symptoms. She has ongoing wheezing and rattling in her chest. Symptoms improved within 10 minutes of taking albuterol inhaler  She has nocturnal symptoms occasionally.   Boyfriend says she snores and sleeps with mouth open. She does wake up being startled awake. Mom does have sleep apnea and wears a CPAP  Current Regimen: prn albuterol, zyrtec and singulair Asthma Triggers: cats, dogs, exercise Exacerbations in the last year: non requiring prednisone History of hospitalization or intubation: not as an adult Allergy Testing: none GERD: yes on prilosec Allergic Rhinitis:  yes  ACT:  Asthma Control Test ACT Total Score  12/26/2020 13   FeNO:  Social history:  Occupation: she is a Quarry manager at [employer], works at a nursing home.  Exposures: lives at home with boyfriend and his parents. Three cats and a dog and she is allergic to both.  Smoking history: former cigarettes and vaping. Passive smoke exposure in childhood and currently in boyfriends parents.    Social History   Occupational History   Not on file  Tobacco Use   Smoking status: Former    Packs/day: 0.50    Years: 5.00    Pack years: 2.50    Types: Cigarettes, E-cigarettes    Quit date: 05/15/2017    Years since quitting: 3.6   Smokeless tobacco: Never   Tobacco comments:    07-15-2017  per pt quit smoking cig. 05-15-2017 but occasionally vapes  Vaping Use   Vaping Use: Some days  Substance and Sexual Activity   Alcohol use: Not Currently    Comment: rarely   Drug use: No   Sexual activity: Yes    Birth control/protection: None    Comment: Last menstral cycle approximately 2 weeks ago "Late August".  Pt denies being pregnant.    Relevant family history:  Family History  Problem Relation Age of Onset   Asthma Mother    Irritable bowel syndrome Mother    Endometriosis Mother    Colon cancer Mother    COPD Mother    Hypertension Father    Colon polyps Father    Endometriosis Sister    Irritable bowel syndrome Sister    Diverticulitis Sister    Heart attack Maternal Grandmother    Breast cancer Paternal Grandfather    Colon cancer Maternal Uncle    Liver cancer Neg Hx    Rectal cancer Neg Hx    Pancreatic cancer Neg Hx    Stomach cancer Neg Hx  Esophageal cancer Neg Hx     Past Medical History:  Diagnosis Date   Allergy    cats   Anemia    Anxiety    panic attacks   Asthma    as a child - dirty cats will cause an asthma attack   Depression    GERD (gastroesophageal reflux disease)    Hemorrhoids    History of asthma    childhood   IBS (irritable bowel syndrome)    Pilonidal cyst    Wears glasses     Past Surgical History:  Procedure Laterality Date   ANAL RECTAL MANOMETRY N/A 07/24/2020   Procedure: ANO RECTAL MANOMETRY;  Surgeon: Doran Stabler, MD;  Location: [facility];  Service: Gastroenterology;  Laterality: N/A;   COLONOSCOPY     COLONOSCOPY WITH ESOPHAGOGASTRODUODENOSCOPY (EGD)  x2  last one 2015   KNEE ARTHROSCOPY Left  03/30/2020   PILONIDAL CYST EXCISION N/A 07/18/2017   Procedure: CYST EXCISION PILONIDAL;  Surgeon: Clovis Riley, MD;  Location: [facility];  Service: General;  Laterality: N/A;   UPPER GASTROINTESTINAL ENDOSCOPY     WISDOM TOOTH EXTRACTION  2015     Physical Exam: Blood pressure 116/80, pulse 99, temperature 97.9 F (36.6 C), temperature source Oral, height 5' (1.524 m), weight 166 lb (75.3 kg), SpO2 97 %. Gen:      No acute distress ENT:  enlarged adenoids, crowded oropharynx, retroagnathia, no nasal polyps, mucus membranes moist Lungs:    No increased respiratory effort, symmetric chest wall excursion, clear to auscultation bilaterally, no wheezes or crackles CV:         Regular rate and rhythm; no murmurs, rubs, or gallops.  No pedal edema Abd:      + bowel sounds; soft, non-tender; no distension MSK: no acute synovitis of DIP or PIP joints, no mechanics hands.  Skin:      Warm and dry; no rashes Neuro: normal speech, no focal facial asymmetry Psych: alert and oriented x3, normal mood and affect   Data Reviewed/Medical Decision Making:  Independent interpretation of tests: Imaging:  Review of patient's lung bases images in CT A/P in 11/2019 revealed no pulmonary process. The patient's images have been independently reviewed by me.    PFTs: None on file No flowsheet data found.  Labs:  Lab Results  Component Value Date   WBC 7.0 10/16/2020   HGB 13.4 10/16/2020   HCT 40.6 10/16/2020   MCV 77.5 (L) 10/16/2020   PLT 377.0 10/16/2020   Lab Results  Component Value Date   NA 135 06/16/2020   K 3.8 06/16/2020   CL 102 06/16/2020   CO2 23 06/16/2020     Immunization status:  Immunization History  Administered Date(s) Administered   DTaP 04/03/1995, 08/19/1995, 06/07/1996, 05/09/2000   H1N1 01/01/2008   HPV Quadrivalent 08/29/2006, 10/31/2006, 01/01/2008   Hepatitis A 12/20/2008, 09/13/2010   Hepatitis A, Adult 12/20/2008, 09/13/2010    Hepatitis A, Ped/Adol-2 Dose 12/20/2008, 09/13/2010   Hepatitis B, ped/adol 1995-10-16, 03/03/1995, 04/03/1995, 08/19/1995, 06/07/1996, 08/29/2006   HiB (PRP-OMP) 04/03/1995, 08/19/1995, 06/07/1996   IPV 04/03/1995, 08/19/1995, 06/07/1996, 05/09/2000   Influenza Split 12/20/2008, 04/16/2012   Influenza,inj,quad, With Preservative 12/15/2012   Influenza-Unspecified 12/15/2012   MMR 06/24/1996, 05/09/2000   Meningococcal Conjugate 07/30/2006, 09/13/2010   Moderna Sars-Covid-2 Vaccination 10/14/2019, 11/04/2019   Pneumococcal Conjugate-13 11/26/2019   Pneumococcal Polysaccharide-23 10/16/2020   Tdap 07/30/2006   Varicella 05/09/2000, 07/30/2006     I reviewed prior external note(s) from  Gastroenterology, hospital stays  I reviewed the result(s) of the labs and imaging as noted above.   I have ordered PFTs  Assessment:  Poorly controlled moderate persistent asthma Ulcerative Colitis on Humira OSA - high risk GERD  Plan/Recommendations: Obtain full set of PFTs.  Plan is to start maintenance inhaler with advair.  Will obtain HSAT for sleep apnea.  Continue prn albuterol Continue PPI for reflux.   She has already gotten her flu shot.    Return to Care: Return in about 4 weeks (around 01/23/2021).  Lenice Llamas, MD [facility]  CC: Jettie Booze, NP

## 2020-12-28 ENCOUNTER — Telehealth: Payer: Self-pay | Admitting: Pharmacy Technician

## 2020-12-28 ENCOUNTER — Other Ambulatory Visit (HOSPITAL_COMMUNITY): Payer: Self-pay

## 2020-12-28 NOTE — Telephone Encounter (Signed)
Patient Advocate Encounter  Received notification from [employer] that prior authorization for ADVAIR is required.   PA NOT NEEDED. SUBMITTED AS BRAND ADVAIR AND FILLED 11.9.22   Luciano Cutter, CPhT Patient Advocate Phone: [phone] Fax:  [phone]

## 2021-01-01 ENCOUNTER — Other Ambulatory Visit: Payer: Self-pay | Admitting: Gastroenterology

## 2021-01-04 NOTE — Telephone Encounter (Signed)
I called patient, she states that she can't come in for labs today. She will be due on 01/19/21 for next Humira dose. Pt will come in 1-2 days before that dose. Pt verbalized understanding and had no other concerns.

## 2021-01-04 NOTE — Telephone Encounter (Signed)
Patient notified of recommendations via my chart. Humira trough and antibody lab in epic.   [facility] referral form, records (including all procedure reports and pathology reports), demographic and insurance information have been faxed to Dr. Carlos Levering at [phone].

## 2021-01-04 NOTE — Telephone Encounter (Signed)
Marissa Morales,  It has been difficult to get Marissa Morales's proctitis under control.  (See message stream)  Seems like she did well during the induction phase of Humira but symptoms have relapsed on 40 mg every other week dosing.  Please get in contact with her and arrange a Humira trough level to be done 1 to 2 days before her next scheduled injection.  Also, she and I had discussed the possibility of seeking IBD specialist consultation at an academic center, and I think it is time to do that.  Especially considering such individuals are usually booked out months for new appointments.  I would like to send a referral to Dr. [name] with the [facility] division.  Fax my office notes, procedure and biopsy reports.  - HD

## 2021-01-05 ENCOUNTER — Telehealth: Payer: Self-pay

## 2021-01-05 NOTE — Telephone Encounter (Signed)
Spoke with patient to remind her that she is due for Humira labs today. No appointment is necessary. Patient is aware that she can stop by the lab in the basement at her convenience before 5 PM today before she doe her Humira injection. Patient verbalized understanding and had no concerns at the end of the call.

## 2021-01-05 NOTE — Telephone Encounter (Signed)
Pt sent my chart message that she will not be able to come in today for labs. Pt reports that she tested positive for COVID. New reminder in epic for patient to come for labs prior to 12/2 Humira injection.

## 2021-01-05 NOTE — Telephone Encounter (Signed)
-----   Message from Yevette Edwards, RN sent at 01/04/2021  2:00 PM EST ----- Regarding: Humira labs Humira antibody and trough level today, the order is in epic

## 2021-01-17 ENCOUNTER — Telehealth: Payer: Self-pay

## 2021-01-17 NOTE — Telephone Encounter (Signed)
Called and spoke with patient. She states that she took her Humira injection on 11/26. Pt states that she will be due for her next injection on 12/10. Pt has been advised that she will need to come in for blood work on 12/8/ or 12/9 before she takes the injection. Pt verbalized understanding and had no concerns at the end of the call.   New reminder in epic.

## 2021-01-17 NOTE — Telephone Encounter (Signed)
-----   Message from Yevette Edwards, RN sent at 01/05/2021  2:08 PM EST ----- Regarding: Humira labs Humira antibody and trough levels either 11/30 or 12/1. Next Humira injection due on 12/2

## 2021-01-23 ENCOUNTER — Encounter: Payer: Self-pay | Admitting: Gastroenterology

## 2021-01-25 ENCOUNTER — Telehealth: Payer: Self-pay

## 2021-01-25 NOTE — Telephone Encounter (Signed)
Pt has viewed my chart message and states that she will come by for labs tomorrow.

## 2021-01-25 NOTE — Telephone Encounter (Signed)
Attempted to reach patient in regards to lab reminder. Her vm is currently full and unable to accept any new messages at this time.   Will send my chart message to patient as well.

## 2021-01-25 NOTE — Telephone Encounter (Signed)
-----   Message from Yevette Edwards, RN sent at 01/17/2021  9:03 AM EST ----- Regarding: Labs Humira antibody and trough levels either 12/8 or 12/9. Next Humira injection due on 12/10

## 2021-01-26 ENCOUNTER — Other Ambulatory Visit: Payer: 59

## 2021-01-26 DIAGNOSIS — K51211 Ulcerative (chronic) proctitis with rectal bleeding: Secondary | ICD-10-CM

## 2021-01-31 ENCOUNTER — Other Ambulatory Visit: Payer: Self-pay

## 2021-01-31 ENCOUNTER — Ambulatory Visit (INDEPENDENT_AMBULATORY_CARE_PROVIDER_SITE_OTHER): Payer: 59 | Admitting: Internal Medicine

## 2021-01-31 DIAGNOSIS — J4540 Moderate persistent asthma, uncomplicated: Secondary | ICD-10-CM | POA: Diagnosis not present

## 2021-01-31 LAB — PULMONARY FUNCTION TEST
DL/VA % pred: 102 %
DL/VA: 4.93 ml/min/mmHg/L
DLCO cor % pred: 104 %
DLCO cor: 20.41 ml/min/mmHg
DLCO unc % pred: 104 %
DLCO unc: 20.41 ml/min/mmHg
FEF 25-75 Post: 3.21 L/sec
FEF 25-75 Pre: 3.23 L/sec
FEF2575-%Change-Post: 0 %
FEF2575-%Pred-Post: 95 %
FEF2575-%Pred-Pre: 96 %
FEV1-%Change-Post: 0 %
FEV1-%Pred-Post: 89 %
FEV1-%Pred-Pre: 89 %
FEV1-Post: 2.57 L
FEV1-Pre: 2.58 L
FEV1FVC-%Change-Post: 0 %
FEV1FVC-%Pred-Pre: 102 %
FEV6-%Change-Post: 0 %
FEV6-%Pred-Post: 88 %
FEV6-%Pred-Pre: 88 %
FEV6-Post: 2.92 L
FEV6-Pre: 2.94 L
FEV6FVC-%Pred-Post: 100 %
FEV6FVC-%Pred-Pre: 100 %
FVC-%Change-Post: 0 %
FVC-%Pred-Post: 88 %
FVC-%Pred-Pre: 88 %
FVC-Post: 2.92 L
FVC-Pre: 2.94 L
Post FEV1/FVC ratio: 88 %
Post FEV6/FVC ratio: 100 %
Pre FEV1/FVC ratio: 88 %
Pre FEV6/FVC Ratio: 100 %
RV % pred: 103 %
RV: 1.14 L
TLC % pred: 96 %
TLC: 4.28 L

## 2021-01-31 NOTE — Patient Instructions (Signed)
Full PFT performed today. °

## 2021-01-31 NOTE — Progress Notes (Signed)
Full PFT performed today. °

## 2021-02-03 LAB — ADALIMUMAB+AB (SERIAL MONITOR)
Adalimumab Drug Level: 3 ug/mL
Anti-Adalimumab Antibody: <25 ng/mL

## 2021-02-05 ENCOUNTER — Other Ambulatory Visit: Payer: Self-pay

## 2021-02-05 ENCOUNTER — Encounter: Payer: Self-pay | Admitting: Internal Medicine

## 2021-02-05 ENCOUNTER — Ambulatory Visit (INDEPENDENT_AMBULATORY_CARE_PROVIDER_SITE_OTHER): Payer: 59 | Admitting: Internal Medicine

## 2021-02-05 VITALS — BP 114/78 | HR 96 | Temp 98.1°F | Ht 60.0 in | Wt 170.0 lb

## 2021-02-05 DIAGNOSIS — J351 Hypertrophy of tonsils: Secondary | ICD-10-CM

## 2021-02-05 DIAGNOSIS — J301 Allergic rhinitis due to pollen: Secondary | ICD-10-CM

## 2021-02-05 DIAGNOSIS — J4540 Moderate persistent asthma, uncomplicated: Secondary | ICD-10-CM

## 2021-02-05 DIAGNOSIS — K219 Gastro-esophageal reflux disease without esophagitis: Secondary | ICD-10-CM

## 2021-02-05 NOTE — Patient Instructions (Signed)
Please schedule follow up scheduled with myself in 4 months.  If my schedule is not open yet, we will contact you with a reminder closer to that time. Please call [phone] if you haven't heard from [place] a month before.   Contact our office if you haven't heard anything in a month about sleep study.   Keep taking the advair. Let me know if any issues with breathing.   Good luck with ENT!

## 2021-02-05 NOTE — Progress Notes (Signed)
[name]    [id number]    06-25-1995  Primary Care Physician:White, Delmar Landau, NP Date of Appointment: 02/05/2021 Established Patient Visit  Chief complaint:   Chief Complaint  Patient presents with   Follow-up    PFT performed 12/14. Pt states she has been doing good since last visit and states she even feels better.     HPI: Marissa Morales is a 25 y.o. woman with moderate persistent asthma and ulcerative colitis.   Interval Updates:  Much better with advair. Used rescue inhaler only once. No thrush.  Had covid in the last month. Had some wheezing.  Course was mild and treated at home. Feeling better.   Wondering if she can come off montelukast - not having side effects just would like to take less medication. .    Current Regimen: advair 250 1 puff bid, albuterol Asthma Triggers:cats, dogs, exercise Exacerbations in the last year: none requiring prednisone History of hospitalization or intubation: not as an adult Allergy Testing: none GERD: yes on ppia Allergic Rhinitis: yes on singulair ACT:  Asthma Control Test ACT Total Score  02/05/2021 23  12/26/2020 13   FeNO:   I have reviewed the patient's family social and past medical history and updated as appropriate.   Past Medical History:  Diagnosis Date   Allergy    cats   Anemia    Anxiety    panic attacks   Asthma    as a child - dirty cats will cause an asthma attack   Depression    GERD (gastroesophageal reflux disease)    Hemorrhoids    History of asthma    childhood   IBS (irritable bowel syndrome)    Pilonidal cyst    Wears glasses     Past Surgical History:  Procedure Laterality Date   ANAL RECTAL MANOMETRY N/A 07/24/2020   Procedure: ANO RECTAL MANOMETRY;  Surgeon: Doran Stabler, MD;  Location: [facility];  Service: Gastroenterology;  Laterality: N/A;   COLONOSCOPY     COLONOSCOPY WITH ESOPHAGOGASTRODUODENOSCOPY (EGD)  x2  last one 2015   KNEE ARTHROSCOPY Left  03/30/2020   PILONIDAL CYST EXCISION N/A 07/18/2017   Procedure: CYST EXCISION PILONIDAL;  Surgeon: Clovis Riley, MD;  Location: [facility];  Service: General;  Laterality: N/A;   UPPER GASTROINTESTINAL ENDOSCOPY     WISDOM TOOTH EXTRACTION  2015    Family History  Problem Relation Age of Onset   Asthma Mother    Irritable bowel syndrome Mother    Endometriosis Mother    Colon cancer Mother    COPD Mother    Hypertension Father    Colon polyps Father    Endometriosis Sister    Irritable bowel syndrome Sister    Diverticulitis Sister    Heart attack Maternal Grandmother    Breast cancer Paternal Grandfather    Colon cancer Maternal Uncle    Liver cancer Neg Hx    Rectal cancer Neg Hx    Pancreatic cancer Neg Hx    Stomach cancer Neg Hx    Esophageal cancer Neg Hx     Social History   Occupational History   Not on file  Tobacco Use   Smoking status: Former    Packs/day: 0.50    Years: 5.00    Pack years: 2.50    Types: Cigarettes, E-cigarettes    Quit date: 05/15/2017    Years since quitting: 3.7   Smokeless tobacco:  Never   Tobacco comments:    07-15-2017  per pt quit smoking cig. 05-15-2017 but occasionally vapes  Vaping Use   Vaping Use: Some days  Substance and Sexual Activity   Alcohol use: Not Currently    Comment: rarely   Drug use: No   Sexual activity: Yes    Birth control/protection: None    Comment: Last menstral cycle approximately 2 weeks ago "Late August".  Pt denies being pregnant.     Physical Exam: Blood pressure 114/78, pulse 96, temperature 98.1 F (36.7 C), temperature source Oral, height 5' (1.524 m), weight 170 lb (77.1 kg), SpO2 100 %.  Gen:      No acute distress ENT:  enlarged tonsils, no nasal polyps, mucus membranes moist Lungs:    No increased respiratory effort, symmetric chest wall excursion, clear to auscultation bilaterally, no wheezes or crackles CV:         Regular rate and rhythm; no murmurs, rubs, or  gallops.  No pedal edema   Data Reviewed: Imaging:  PFTs:  PFT Results Latest Ref Rng & Units 01/31/2021  FVC-Pre L 2.94  FVC-Predicted Pre % 88  FVC-Post L 2.92  FVC-Predicted Post % 88  Pre FEV1/FVC % % 88  Post FEV1/FCV % % 88  FEV1-Pre L 2.58  FEV1-Predicted Pre % 89  FEV1-Post L 2.57  DLCO uncorrected ml/min/mmHg 20.41  DLCO UNC% % 104  DLCO corrected ml/min/mmHg 20.41  DLCO COR %Predicted % 104  DLVA Predicted % 102  TLC L 4.28  TLC % Predicted % 96  RV % Predicted % 103   I have personally reviewed the patient's PFTs and normal pulmonary function.   Labs:  Immunization status: Immunization History  Administered Date(s) Administered   DTaP 04/03/1995, 08/19/1995, 06/07/1996, 05/09/2000   H1N1 01/01/2008   HPV Quadrivalent 08/29/2006, 10/31/2006, 01/01/2008   Hepatitis A 12/20/2008, 09/13/2010   Hepatitis A, Adult 12/20/2008, 09/13/2010   Hepatitis A, Ped/Adol-2 Dose 12/20/2008, 09/13/2010   Hepatitis B, ped/adol 01-14-1995, 03/03/1995, 04/03/1995, 08/19/1995, 06/07/1996, 08/29/2006   HiB (PRP-OMP) 04/03/1995, 08/19/1995, 06/07/1996   IPV 04/03/1995, 08/19/1995, 06/07/1996, 05/09/2000   Influenza Split 12/20/2008, 04/16/2012   Influenza,inj,Quad PF,6+ Mos 11/18/2020   Influenza,inj,quad, With Preservative 12/15/2012   Influenza-Unspecified 12/15/2012   MMR 06/24/1996, 05/09/2000   Meningococcal Conjugate 07/30/2006, 09/13/2010   Moderna Sars-Covid-2 Vaccination 10/14/2019, 11/04/2019   Pneumococcal Conjugate-13 11/26/2019   Pneumococcal Polysaccharide-23 10/16/2020   Tdap 07/30/2006   Varicella 05/09/2000, 07/30/2006      Assessment:   Moderate persistent asthma, improved control GERD Concern for OSA Enlarged tonsils Allergic rhinitis  Plan/Recommendations:  Continue advair 1 puff bid. Ok to trial off singulair, if asthma symptoms worsen would resume.  Follow up with ENT regarding tonsils Still awaiting sleep study - delay in getting  scheduled.  Continue PPI for reflux.     Return to Care: Return in about 4 months (around 06/06/2021).   Lenice Llamas, MD Pulmonary and [place]

## 2021-02-14 ENCOUNTER — Encounter: Payer: Self-pay | Admitting: Gastroenterology

## 2021-02-14 ENCOUNTER — Telehealth: Payer: Self-pay

## 2021-02-14 NOTE — Telephone Encounter (Signed)
Patient notified of results via my chart. Pt states that her insurance will be changing next week. Will initiate PA once patient provides updated insurance information.

## 2021-02-14 NOTE — Telephone Encounter (Signed)
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 She has persistent symptoms, so drug level and antibody were checked.  Fortunately, no antibodies detected, and her Humira drug level is in the therapeutic range.  However, since she has persistent symptoms, a higher drug level may give a better response.  Therefore, I would like her to keep the current dose of one pen (40 mg) pretreatment, but increase the frequency to every week from the current every other week dosing.  Of course, this is dependent on insurance approval.  Please work on that and let me know if there are questions or problems.   - HD

## 2021-02-23 NOTE — Telephone Encounter (Signed)
Patient sent insurance information via my chart.  RX BIN: [id number]  RX PCN: [id number]  RX Group: [id number] Member ID [id number]

## 2021-02-23 NOTE — Telephone Encounter (Signed)
PA initiated via [facility], will await insurance response Key: [id number] - PA Case ID: [id number]

## 2021-02-26 MED ORDER — HUMIRA (2 PEN) 40 MG/0.4ML ~~LOC~~ AJKT: 1.0000 "pen " | AUTO-INJECTOR | SUBCUTANEOUS | 6 refills | Status: DC

## 2021-02-26 NOTE — Telephone Encounter (Signed)
PA Case: [id number], Status: Approved, Coverage Starts on: 02/23/2021 12:00 AM, Coverage Ends on: 05/18/2021 12:00 AM.  Patient notified of approval via my chart. Awaiting specialty pharmacy information so that new RX can be sent in for Humira 40 mg weekly.

## 2021-02-26 NOTE — Telephone Encounter (Signed)
I spoke with Elenore Rota at [facility], he provided me with the fax # [phone]). RX for Humira 40 mg weekly has been sent to [facility].

## 2021-02-26 NOTE — Telephone Encounter (Signed)
Attempted to reach patient. Her vm is full and cannot accept any new messages at this time. Will try to reach pt again at a different time.

## 2021-02-26 NOTE — Telephone Encounter (Signed)
Patient is scheduled for a f/u with Dr. Loletha Carrow on Tuesday, 04/03/21 at 1:40 pm. Pt's appt information was sent to her via my chart. See 1/9 patient message for details.

## 2021-02-26 NOTE — Telephone Encounter (Signed)
Thank you for the update.  She needs a clinic visit with me in 4 to 6 weeks.  HD

## 2021-03-07 NOTE — Telephone Encounter (Signed)
I called and spoke with at [facility], pharmacy technician with [facility]. Clarification request was faxed back to them on 03/05/21. Confirmation fax received. Pershing Proud took the verbal clarification and will reach out to patient to set up shipment of her Humira. Pt has been notified via my chart.

## 2021-03-09 ENCOUNTER — Encounter: Payer: Self-pay | Admitting: Internal Medicine

## 2021-03-18 ENCOUNTER — Encounter: Payer: Self-pay | Admitting: Gastroenterology

## 2021-03-20 NOTE — Telephone Encounter (Signed)
Discontinue Linzess and start:  Amitiza 8 mcg tablet 1 tablet twice daily for constipation Dispense 60, refill 3  Discontinue hyoscyamine and start: Dicyclomine 10 mg capsule 1 tablet 2-3 times daily as needed for abdominal cramps Dispense 60, refill to  Discontinue prochlorperazine and start:  Ondansetron 4 mg 1 tablet every 8 hours as needed for nausea Dispense 60 tablets, refill 1  Also, referral was sent to Dr. [name] at the [place] department for a consultation regarding [place]'s inflammatory bowel disease.  Please check in with her and find out if she has heard from my clinic and, if not, let me know and I will email Dr. Carlos Levering.  HD

## 2021-03-21 ENCOUNTER — Other Ambulatory Visit: Payer: Self-pay

## 2021-03-21 MED ORDER — ONDANSETRON HCL 4 MG PO TABS: 4.0000 mg | ORAL_TABLET | Freq: Three times a day (TID) | ORAL | 1 refills | Status: DC | PRN

## 2021-03-21 MED ORDER — LUBIPROSTONE 8 MCG PO CAPS: 8.0000 ug | ORAL_CAPSULE | Freq: Two times a day (BID) | ORAL | 3 refills | Status: DC

## 2021-03-21 MED ORDER — DICYCLOMINE HCL 10 MG PO CAPS: ORAL_CAPSULE | ORAL | 1 refills | Status: DC

## 2021-03-22 ENCOUNTER — Telehealth: Payer: Self-pay

## 2021-03-22 NOTE — Telephone Encounter (Signed)
PA for Amitiza has been sent with cover my meds. Will await response.

## 2021-03-23 NOTE — Telephone Encounter (Signed)
Rx has been approved   Deloyce Walthers (Key: [id number]) - [phone] Lubiprostone 8MCG capsules     Status: PA Response - Approved  Created: February 1st, 2023 [id number]  Sent: February 2nd, 2023  Open   Archive

## 2021-03-24 ENCOUNTER — Other Ambulatory Visit: Payer: Self-pay

## 2021-03-24 ENCOUNTER — Encounter (HOSPITAL_BASED_OUTPATIENT_CLINIC_OR_DEPARTMENT_OTHER): Payer: Self-pay | Admitting: *Deleted

## 2021-03-24 ENCOUNTER — Emergency Department (HOSPITAL_BASED_OUTPATIENT_CLINIC_OR_DEPARTMENT_OTHER)
Admission: EM | Admit: 2021-03-24 | Discharge: 2021-03-24 | Disposition: A | Discharge status: Home / Self Care | Payer: Self-pay | Attending: Emergency Medicine | Admitting: Emergency Medicine

## 2021-03-24 ENCOUNTER — Emergency Department (HOSPITAL_BASED_OUTPATIENT_CLINIC_OR_DEPARTMENT_OTHER): Payer: Self-pay

## 2021-03-24 DIAGNOSIS — K644 Residual hemorrhoidal skin tags: Secondary | ICD-10-CM | POA: Insufficient documentation

## 2021-03-24 DIAGNOSIS — K5900 Constipation, unspecified: Secondary | ICD-10-CM

## 2021-03-24 DIAGNOSIS — Z7951 Long term (current) use of inhaled steroids: Secondary | ICD-10-CM | POA: Insufficient documentation

## 2021-03-24 DIAGNOSIS — R591 Generalized enlarged lymph nodes: Secondary | ICD-10-CM | POA: Insufficient documentation

## 2021-03-24 DIAGNOSIS — K648 Other hemorrhoids: Secondary | ICD-10-CM | POA: Insufficient documentation

## 2021-03-24 DIAGNOSIS — K51911 Ulcerative colitis, unspecified with rectal bleeding: Secondary | ICD-10-CM | POA: Insufficient documentation

## 2021-03-24 DIAGNOSIS — N9489 Other specified conditions associated with female genital organs and menstrual cycle: Secondary | ICD-10-CM | POA: Insufficient documentation

## 2021-03-24 DIAGNOSIS — J45909 Unspecified asthma, uncomplicated: Secondary | ICD-10-CM | POA: Insufficient documentation

## 2021-03-24 DIAGNOSIS — R7401 Elevation of levels of liver transaminase levels: Secondary | ICD-10-CM | POA: Insufficient documentation

## 2021-03-24 DIAGNOSIS — D473 Essential (hemorrhagic) thrombocythemia: Secondary | ICD-10-CM | POA: Insufficient documentation

## 2021-03-24 DIAGNOSIS — R112 Nausea with vomiting, unspecified: Secondary | ICD-10-CM

## 2021-03-24 LAB — URINALYSIS, MICROSCOPIC (REFLEX)

## 2021-03-24 LAB — URINALYSIS, ROUTINE W REFLEX MICROSCOPIC
Bilirubin Urine: NEGATIVE
Glucose, UA: NEGATIVE mg/dL
Hgb urine dipstick: NEGATIVE
Ketones, ur: NEGATIVE mg/dL
Leukocytes,Ua: NEGATIVE
Nitrite: NEGATIVE
Protein, ur: 30 mg/dL — AB
Specific Gravity, Urine: >=1.03 (ref 1.005–1.030)
pH: 5.5 (ref 5.0–8.0)

## 2021-03-24 LAB — CBC WITH DIFFERENTIAL/PLATELET
Abs Immature Granulocytes: 0.02 10*3/uL (ref 0.00–0.07)
Basophils Absolute: 0.1 10*3/uL (ref 0.0–0.1)
Basophils Relative: 1 %
Eosinophils Absolute: 0.5 10*3/uL (ref 0.0–0.5)
Eosinophils Relative: 6 %
HCT: 40.5 % (ref 36.0–46.0)
Hemoglobin: 13.3 g/dL (ref 12.0–15.0)
Immature Granulocytes: 0 %
Lymphocytes Relative: 26 %
Lymphs Abs: 2.4 10*3/uL (ref 0.7–4.0)
MCH: 27.7 pg (ref 26.0–34.0)
MCHC: 32.8 g/dL (ref 30.0–36.0)
MCV: 84.4 fL (ref 80.0–100.0)
Monocytes Absolute: 0.8 10*3/uL (ref 0.1–1.0)
Monocytes Relative: 9 %
Neutro Abs: 5.3 10*3/uL (ref 1.7–7.7)
Neutrophils Relative %: 58 %
Platelets: 420 10*3/uL — ABNORMAL HIGH (ref 150–400)
RBC: 4.8 MIL/uL (ref 3.87–5.11)
RDW: 14 % (ref 11.5–15.5)
WBC: 9.2 10*3/uL (ref 4.0–10.5)
nRBC: 0 % (ref 0.0–0.2)

## 2021-03-24 LAB — COMPREHENSIVE METABOLIC PANEL
ALT: 10 U/L (ref 0–44)
AST: 14 U/L — ABNORMAL LOW (ref 15–41)
Albumin: 4.1 g/dL (ref 3.5–5.0)
Alkaline Phosphatase: 102 U/L (ref 38–126)
Anion gap: 8 (ref 5–15)
BUN: 9 mg/dL (ref 6–20)
CO2: 25 mmol/L (ref 22–32)
Calcium: 8.8 mg/dL — ABNORMAL LOW (ref 8.9–10.3)
Chloride: 103 mmol/L (ref 98–111)
Creatinine, Ser: 0.52 mg/dL (ref 0.44–1.00)
GFR, Estimated: >60 mL/min (ref >60)
Glucose, Bld: 90 mg/dL (ref 70–99)
Potassium: 3.6 mmol/L (ref 3.5–5.1)
Sodium: 136 mmol/L (ref 135–145)
Total Bilirubin: 0.6 mg/dL (ref 0.3–1.2)
Total Protein: 7.8 g/dL (ref 6.5–8.1)

## 2021-03-24 LAB — HCG, SERUM, QUALITATIVE: Preg, Serum: NEGATIVE

## 2021-03-24 LAB — LIPASE, BLOOD: Lipase: 35 U/L (ref 11–51)

## 2021-03-24 MED ORDER — PREDNISONE 5 MG PO TABS: ORAL_TABLET | ORAL | 0 refills | Status: DC

## 2021-03-24 MED ORDER — ONDANSETRON HCL 4 MG PO TABS: 4.0000 mg | ORAL_TABLET | Freq: Four times a day (QID) | ORAL | 0 refills | Status: DC

## 2021-03-24 MED ORDER — MORPHINE SULFATE (PF) 4 MG/ML IV SOLN
4.0000 mg | Freq: Once | INTRAVENOUS | Status: AC
  Administered 2021-03-24: 4 mg via INTRAVENOUS
  Filled 2021-03-24: qty 1

## 2021-03-24 MED ORDER — HYDROCORTISONE 1 % EX CREA: TOPICAL_CREAM | CUTANEOUS | 0 refills | Status: AC

## 2021-03-24 MED ORDER — ONDANSETRON HCL 4 MG/2ML IJ SOLN
4.0000 mg | Freq: Once | INTRAMUSCULAR | Status: AC
Start: 2021-03-24 — End: 2021-03-24
  Administered 2021-03-24: 4 mg via INTRAVENOUS
  Filled 2021-03-24: qty 2

## 2021-03-24 MED ORDER — LACTATED RINGERS IV BOLUS
1000.0000 mL | Freq: Once | INTRAVENOUS | Status: AC
  Administered 2021-03-24: 1000 mL via INTRAVENOUS

## 2021-03-24 MED ORDER — IOHEXOL 300 MG/ML  SOLN
100.0000 mL | Freq: Once | INTRAMUSCULAR | Status: AC | PRN
  Administered 2021-03-24: 100 mL via INTRAVENOUS

## 2021-03-24 NOTE — ED Notes (Signed)
Pt tolerated ginger ale, cheese and crackers without problems

## 2021-03-24 NOTE — ED Triage Notes (Addendum)
Pt reports hx of colitis- increased pain and rectal bleeding x 2 weeks. Also states she has been constipated but when she attempts to have a BM she only passes blood. She is taking linzess

## 2021-03-24 NOTE — ED Notes (Signed)
Patient verbalizes understanding of discharge instructions. Opportunity for questioning and answers were provided. Armband removed by staff, pt discharged from ED. Ambulated out to lobby  

## 2021-03-24 NOTE — ED Provider Notes (Signed)
[facility] [facility] EMERGENCY DEPARTMENT Provider Note   CSN: [id number] Arrival date & time: 03/24/21  1631     History Chief Complaint  Patient presents with   Rectal Bleeding    Marissa Morales is a 27 y.o. female with h/o UC, asthma, IBS, GERD presents to the ER for evaluation of acute on chronic abdominal pain. The patient reports that over the past couple of months she has had increasing abdominal pain and has been put on weekly Humira because of it without any relief still.  She reports for the past week she has had increasing pain and has been having some constipation.  She reports left lower quadrant pain and is only able to pass bloody mucousy stool.  She has tried enemas, suppositories, and laxatives without any relief of symptoms.  She reports she is now starting to feel lightheaded because of the pain and blood loss.  She denies any urinary symptoms.  Denies any fever, chest pain, shortness of breath.  Denies any ulcerations of the skin.  She reports she is also been suffering from a hemorrhoid for the past 2 days but is found some relief with sitz bath's.  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  We will concerned her was that she was trying to take more laxative today, but ended up vomiting it.  She reports she has been having some nausea as well.  She reports why the constipation is more than usual for her is that she takes Linzess which usually makes her have a BM 4-5 times a day.  She has tried a liquid diet also without any relief of symptoms.   Rectal Bleeding Associated symptoms: abdominal pain, light-headedness and vomiting   Associated symptoms: no fever       Home Medications Prior to Admission medications   Medication Sig Start Date End Date Taking? Authorizing Provider  hydrocortisone cream 1 % Apply to affected area 2 times daily 03/24/21  Yes Sherrell Puller, PA-C  ondansetron (ZOFRAN) 4 MG tablet Take 1 tablet (4 mg total) by mouth  every 6 (six) hours. 03/24/21  Yes Sherrell Puller, PA-C  predniSONE (DELTASONE) 5 MG tablet Take 8 tablets (40 mg total) by mouth daily with breakfast for 14 days, THEN 7 tablets (35 mg total) daily with breakfast for 7 days, THEN 6 tablets (30 mg total) daily with breakfast for 7 days, THEN 5 tablets (25 mg total) daily with breakfast for 7 days, THEN 4 tablets (20 mg total) daily with breakfast for 7 days, THEN 3 tablets (15 mg total) daily with breakfast for 7 days, THEN 2 tablets (10 mg total) daily with breakfast for 7 days, THEN 1 tablet (5 mg total) daily with breakfast for 7 days. 03/24/21 05/26/21 Yes Sherrell Puller, PA-C  Adalimumab (HUMIRA PEN) 40 MG/0.4ML PNKT Inject 1 pen into the skin once a week. 02/26/21   Doran Stabler, MD  albuterol (VENTOLIN HFA) 108 (90 Base) MCG/ACT inhaler Inhale into the lungs. 03/15/20   [provider]  amitriptyline (ELAVIL) 10 MG tablet TAKE 1 TABLET BY MOUTH AT BEDTIME 09/19/20   Doran Stabler, MD  BIOTIN PO Take by mouth.    [provider]  dicyclomine (BENTYL) 10 MG capsule 1 tablet 2-3 times a day as needed for abdominal cramps 03/21/21   Doran Stabler, MD  fluticasone-salmeterol (ADVAIR DISKUS) 250-50 MCG/ACT AEPB Inhale 1 puff into the lungs in the morning and at bedtime. 12/26/20  Spero Geralds, MD  lubiprostone (AMITIZA) 8 MCG capsule Take 1 capsule (8 mcg total) by mouth 2 (two) times daily with a meal. 03/21/21   Danis, Kirke Corin, MD  omeprazole (PRILOSEC) 40 MG capsule TAKE 1 CAPSULE (40 MG TOTAL) BY MOUTH DAILY. 11/29/20   Doran Stabler, MD  ondansetron (ZOFRAN) 4 MG tablet Take 1 tablet (4 mg total) by mouth every 8 (eight) hours as needed for nausea or vomiting. 03/21/21   Doran Stabler, MD  sertraline (ZOLOFT) 50 MG tablet Take 1 tablet by mouth daily. 03/01/20   [provider]  Zinc Sulfate (ZINC 15 PO) Take by mouth every morning.    [provider]      Allergies    Bactrim  [sulfamethoxazole-trimethoprim], Bismuth-containing compounds, Doxycycline, Vancomycin, and Vantin [cefpodoxime]    Review of Systems   Review of Systems  Constitutional:  Negative for chills and fever.  Respiratory:  Negative for shortness of breath.   Cardiovascular:  Negative for chest pain.  Gastrointestinal:  Positive for abdominal pain, anal bleeding, constipation, hematochezia, nausea, rectal pain and vomiting.  Genitourinary:  Negative for decreased urine volume, dysuria, flank pain, frequency, hematuria, urgency, vaginal bleeding and vaginal discharge.  Skin:  Negative for rash and wound.  Neurological:  Positive for weakness and light-headedness.   Physical Exam Updated Vital Signs BP 118/88    Pulse 99    Temp 97.7 F (36.5 C) (Oral)    Resp 16    Ht 5' (1.524 m)    Wt 74.8 kg    LMP 02/20/2021 (Approximate) Comment: neg preg test 03/24/2021   SpO2 100%    BMI 32.22 kg/m  Physical Exam Vitals and nursing note reviewed. Exam conducted with a chaperone present Marissa Orn, RN).  Constitutional:      General: She is not in acute distress.    Appearance: She is not toxic-appearing.     Comments: Uncomfortable appearing, but not toxic.  HENT:     Head: Normocephalic and atraumatic.     Mouth/Throat:     Mouth: Mucous membranes are dry.  Eyes:     General: No scleral icterus. Cardiovascular:     Rate and Rhythm: Normal rate and regular rhythm.  Pulmonary:     Effort: Pulmonary effort is normal. No respiratory distress.     Breath sounds: Normal breath sounds.  Abdominal:     General: Bowel sounds are normal.     Palpations: Abdomen is soft.     Tenderness: There is abdominal tenderness. There is no guarding or rebound.     Comments: Diffuse tenderness to palpation, but mainly over the LLQ. No guarding or rebound. Small scab in the RUQ in which she reports is from a recent skin biopsy. Normoactive bowel sounds.   Genitourinary:    Rectum: Tenderness, external hemorrhoid and  internal hemorrhoid present. No anal fissure. Normal anal tone.     Comments: Two external hemorrhoids the size of a pea located around the 6 o'clock region. Internal hemorrhoid palpated at the 8 o'clock position. No blood visible. No stool in the rectal vault. No anal fissure.  Musculoskeletal:        General: No deformity.     Cervical back: Normal range of motion.  Skin:    General: Skin is warm and dry.  Neurological:     General: No focal deficit present.     Mental Status: She is alert. Mental status is at baseline.    ED  Results / Procedures / Treatments   Labs (all labs ordered are listed, but only abnormal results are displayed) Labs Reviewed  COMPREHENSIVE METABOLIC PANEL - Abnormal; Notable for the following components:      Result Value   Calcium 8.8 (*)    AST 14 (*)    All other components within normal limits  CBC WITH DIFFERENTIAL/PLATELET - Abnormal; Notable for the following components:   Platelets 420 (*)    All other components within normal limits  URINALYSIS, ROUTINE W REFLEX MICROSCOPIC - Abnormal; Notable for the following components:   APPearance CLOUDY (*)    Protein, ur 30 (*)    All other components within normal limits  URINALYSIS, MICROSCOPIC (REFLEX) - Abnormal; Notable for the following components:   Bacteria, UA MANY (*)    All other components within normal limits  LIPASE, BLOOD  HCG, SERUM, QUALITATIVE    EKG None  Radiology CT ABDOMEN PELVIS W CONTRAST  Result Date: 03/24/2021 CLINICAL DATA:  LEFT lower quadrant abdominal pain. History of ulcerative colitis. Rectal bleeding. EXAM: CT ABDOMEN AND PELVIS WITH CONTRAST TECHNIQUE: Multidetector CT imaging of the abdomen and pelvis was performed using the standard protocol following bolus administration of intravenous contrast. RADIATION DOSE REDUCTION: This exam was performed according to the departmental dose-optimization program which includes automated exposure control, adjustment of the mA  and/or kV according to patient size and/or use of iterative reconstruction technique. CONTRAST:  157mL OMNIPAQUE IOHEXOL 300 MG/ML  SOLN COMPARISON:  CT abdomen dated 11/25/2019 FINDINGS: Lower chest: No acute abnormality. Hepatobiliary: No focal liver abnormality is seen. Gallbladder appears normal. No bile duct dilatation. Pancreas: Unremarkable. No pancreatic ductal dilatation or surrounding inflammatory changes. Spleen: Normal in size without focal abnormality. Adrenals/Urinary Tract: Adrenal glands appear normal. Kidneys are unremarkable without mass, stone or hydronephrosis. No ureteral or bladder calculi are identified. Bladder is unremarkable, partially decompressed. Stomach/Bowel: No dilated large or small bowel loops. Thickening of the walls of the rectosigmoid colon and lower descending colon, with pericolonic inflammation/fluid stranding. Fluid, with associated air-fluid levels, throughout the remainder of the colon. Stomach is unremarkable. Vascular/Lymphatic: Numerous small and moderate-sized lymph nodes within the central pelvic mesentery and LEFT hemipelvis, likely reactive in nature. Additional mildly prominent lymph nodes within the periaortic retroperitoneum, also likely reactive in nature. No vascular abnormality seen. Reproductive: Uterus and bilateral adnexa are unremarkable. Other: No abscess collection.  No free intraperitoneal air. Musculoskeletal: Osseous structures of the abdomen and pelvis are unremarkable. IMPRESSION: 1. Thickening of the walls of the rectosigmoid colon and lower descending colon, with pericolonic inflammation/fluid stranding. Findings are consistent with the given history of ulcerative colitis, an acute exacerbation. No abscess collection. No free intraperitoneal air. No bowel obstruction. 2. Fluid, and associated air-fluid levels throughout the more proximal colon, compatible with associated ileus. 3. Reactive lymphadenopathy within the pelvis and retroperitoneum.  Electronically Signed   By: Franki Cabot M.D.   On: 03/24/2021 19:03    Procedures Procedures   Medications Ordered in ED Medications  morphine (PF) 4 MG/ML injection 4 mg (4 mg Intravenous Given 03/24/21 1751)  ondansetron (ZOFRAN) injection 4 mg (4 mg Intravenous Given 03/24/21 1751)  lactated ringers bolus 1,000 mL (0 mLs Intravenous Stopped 03/24/21 1935)  iohexol (OMNIPAQUE) 300 MG/ML solution 100 mL (100 mLs Intravenous Contrast Given 03/24/21 1828)    ED Course/ Medical Decision Making/ A&P  Medical Decision Making Amount and/or Complexity of Data Reviewed Labs: ordered. Radiology: ordered.  Risk Prescription drug management.   Karthika Glasper is a 26 y.o. female with h/o UC, asthma, IBS, GERD presents to the ER for evaluation of acute on chronic abdominal pain. The patient reports that over the past couple of months she has had increasing abdominal pain and has been put on weekly Humira because of it without any relief still.  She reports for the past week she has had increasing pain and has been having some constipation.  She reports left lower quadrant pain and is only able to pass bloody mucousy stool.  She has tried enemas, suppositories, and laxatives without any relief of symptoms.  She reports she is now starting to feel lightheaded because of the pain and blood loss.  She denies any urinary symptoms.  Denies any fever, chest pain, shortness of breath.  Denies any ulcerations of the skin.  She reports she is also been suffering from a hemorrhoid for the past 2 days but is found some relief with sitz bath's.  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  We will concerned her was that she was trying to take more laxative today, but ended up vomiting it.  She reports she has been having some nausea as well.  She reports why the constipation is more than usual for her is that she takes Linzess which usually makes her have a BM 4-5  times a day.  She has tried a liquid diet also without any relief of symptoms.  Differential diagnosis includes but is not limited to ileus, small bowel obstruction, abscess, diverticulitis, constipation, acute exacerbation of UC.  Given the duration of her symptoms as well as her known history of ulcerative colitis, CT abdomen warranted to rule out any abscess or perforation of her bowel.  Basic labs ordered as well.  Morphine, Zofran, and LR bolus ordered.  I independently reviewed and interpreted the patient's labs and imaging.  I agree with the radiologist findings.  CT abdomen shows:  1. Thickening of the walls of the rectosigmoid colon and lower descending colon, with pericolonic inflammation/fluid stranding. Findings are consistent with the given history of ulcerative colitis, an acute exacerbation. No abscess collection. No free intraperitoneal air. No bowel obstruction.  2. Fluid, and associated air-fluid levels throughout the more proximal colon, compatible with associated ileus.  3. Reactive lymphadenopathy within the pelvis and retroperitoneum.   CMP shows mildly decreased calcium at 8.8.  Decreased AST at 14, otherwise no electrolyte abnormality.  CBC shows slightly elevated platelets at 420 although no signs of increased white blood cell count or anemia.  Negative hCG.  Lipase is normal.  Urinalysis shows cloudy concentrated urine with some protein.  Many bacteria although 0-5 white blood cells seen on microscopy.  21-50 squamous cells seen in the catch, likely contaminated specimen.  Patient not experiencing any UTI symptoms.  Given the CT finding, will consult GI.  GI paged return promptly and spoke to Dr. Lorenso Courier of [facility].  She reviewed the patient's labs and imaging and recommended 40 mg of prednisone for 2 weeks and a 5 mg weekly taper after that.  She advised the patient keep her upcoming appointment on 04-03-2021.  She reported the patient does not have any p.o. tolerance or  adequate pain management for admission, although she reports that she is most likely a good candidate for outpatient treatment.  On reevaluation, the patient reports that her pain  is manageable and that she has not had any vomiting since being here.  She also reports that she has had a bowel movement since being here as well.  Denies any black stools and reports a small amount of blood.  P.o. challenge initiated.  Patient passed and was able to eat crackers, applesauce, and water.  She reports she is feeling better after the Zofran and pain medication.  I discussed with her Dr. Libby Maw recommendations.  I offered the patient admission, but the patient declined stating that she would much better just be at home and treat this with the prednisone.  I think this is reasonable.  We discussed bowel rest, clear liquid diet for her ileus.  I discussed with her that she will not need to use any suppositories, MiraLAX, or anything else other than her typical GI medications that she takes daily.  We will prescribe her Zofran, prednisone, and the hydrocortisone cream for her hemorrhoids.  I discussed with her strict return precautions.  Patient agrees to plan.  Patient is stable being discharged home in good condition.   Final Clinical Impression(s) / ED Diagnoses Final diagnoses:  Acute ulcerative colitis with rectal bleeding (HCC)  Nausea and vomiting, unspecified vomiting type  Constipation, unspecified constipation type    Rx / DC Orders ED Discharge Orders          Ordered    hydrocortisone cream 1 %        03/24/21 2003    predniSONE (DELTASONE) 5 MG tablet        03/24/21 2003    ondansetron (ZOFRAN) 4 MG tablet  Every 6 hours        03/24/21 2003              Sherrell Puller, PA-C 03/26/21 1133    Long, Wonda Old[facility], MD 04/03/21 [phone]

## 2021-03-24 NOTE — Discharge Instructions (Addendum)
You were seen here today for evaluation of your ulcerative colitis. Your lab work is reassuring. The CT of your abdomen showed an acute excerbation of your UC and ileus in your bowel. To help with this, you will need to take Prednisone and rest your bowels. By doing this, you will need to stick to the clear liquid diet, and stop using laxatives, suppositories, etc. Other than your daily medications. The prednisone should help reduce the inflammation in your bowel that is causing your constipation. Please follow up with your GI specialist at your upcoming appointment. If you have any worsening abdominal pain, nausea or vomiting not controlled by the zofran, fevers, please return to the nearest ER.

## 2021-03-25 ENCOUNTER — Encounter: Payer: Self-pay | Admitting: Gastroenterology

## 2021-03-30 ENCOUNTER — Ambulatory Visit (INDEPENDENT_AMBULATORY_CARE_PROVIDER_SITE_OTHER): Payer: 59 | Admitting: Gastroenterology

## 2021-03-30 ENCOUNTER — Other Ambulatory Visit: Payer: 59

## 2021-03-30 ENCOUNTER — Encounter: Payer: Self-pay | Admitting: Gastroenterology

## 2021-03-30 VITALS — BP 110/70 | HR 88 | Ht 61.0 in | Wt 165.4 lb

## 2021-03-30 DIAGNOSIS — R1030 Lower abdominal pain, unspecified: Secondary | ICD-10-CM | POA: Diagnosis not present

## 2021-03-30 DIAGNOSIS — K51211 Ulcerative (chronic) proctitis with rectal bleeding: Secondary | ICD-10-CM

## 2021-03-30 DIAGNOSIS — K625 Hemorrhage of anus and rectum: Secondary | ICD-10-CM

## 2021-03-30 MED ORDER — PLENVU 140 G PO SOLR: 140.0000 g | ORAL | 0 refills | Status: DC

## 2021-03-30 MED ORDER — OXYCODONE-ACETAMINOPHEN 5-325 MG PO TABS
1.0000 | ORAL_TABLET | Freq: Four times a day (QID) | ORAL | 0 refills | Status: DC | PRN
Start: 2021-03-30 — End: 2021-08-22

## 2021-03-30 MED ORDER — PREDNISONE 10 MG PO TABS: 10.0000 mg | ORAL_TABLET | Freq: Every day | ORAL | 0 refills | Status: DC

## 2021-03-30 NOTE — Patient Instructions (Signed)
If you are age 27 or older, your body mass index should be between 23-30. Your Body mass index is 31.25 kg/m. If this is out of the aforementioned range listed, please consider follow up with your Primary Care Provider.  If you are age 38 or younger, your body mass index should be between 19-25. Your Body mass index is 31.25 kg/m. If this is out of the aformentioned range listed, please consider follow up with your Primary Care Provider.   ________________________________________________________  The [facility] providers would like to encourage you to use [facility] to communicate with providers for non-urgent requests or questions.  Due to long hold times on the telephone, sending your provider a message by [facility] may be a faster and more efficient way to get a response.  Please allow 48 business hours for a response.  Please remember that this is for non-urgent requests.  _______________________________________________________  Your provider has requested that you go to the basement level for lab work before leaving today. Press "B" on the elevator. The lab is located at the first door on the left as you exit the elevator.  Due to recent changes in healthcare laws, you may see the results of your imaging and laboratory studies on MyChart before your provider has had a chance to review them.  We understand that in some cases there may be results that are confusing or concerning to you. Not all laboratory results come back in the same time frame and the provider may be waiting for multiple results in order to interpret others.  Please give [place] 48 hours in order for your provider to thoroughly review all the results before contacting the office for clarification of your results.

## 2021-03-30 NOTE — Progress Notes (Signed)
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  Prior notes with details of clinical course, she had insufficient response to mesalamine requiring courses of prednisone, then little response to vedolizumab, then was started on Humira last September and increased to 40 mg weekly about 2 months ago.  She has had ongoing lower abdominal pain, constipation and bleeding and went to the ED last week with findings as below.  Prednisone was started, see phone notes for more details.  Marissa Morales has not been feeling well for the last couple months.  She started having more lower abdominal cramps, constipation and rectal pain with bleeding sometime in December, then things got worse a few weeks ago.  She is taking the Humira as directed, and also recently started minocycline for acne (was on doxycycline before that).  She still feels like it is difficult to have a bowel movement, though has had a couple of small ones yesterday since starting the prednisone.  She missed read the dosing and did not realize they were 5 mg tablets prescribed by the ED, so she is only taken 20 mg a day for the last several days.   ROS:  Review of Systems She denies fevers chills weight loss chest pain dyspnea or dysuria  Past Medical History: Past Medical History:  Diagnosis Date   Allergy    cats   Anemia    Anxiety    panic attacks   Asthma    as a child - dirty cats will cause an asthma attack   Depression    GERD (gastroesophageal reflux disease)    Hemorrhoids    History of asthma    childhood   IBS (irritable bowel syndrome)    Pilonidal cyst    Wears glasses      Past Surgical History: Past Surgical History:  Procedure  Laterality Date   ANAL RECTAL MANOMETRY N/A 07/24/2020   Procedure: ANO RECTAL MANOMETRY;  Surgeon: Doran Stabler, MD;  Location: [facility];  Service: Gastroenterology;  Laterality: N/A;   COLONOSCOPY     COLONOSCOPY WITH ESOPHAGOGASTRODUODENOSCOPY (EGD)  x2  last one 2015   KNEE ARTHROSCOPY Left 03/30/2020   PILONIDAL CYST EXCISION N/A 07/18/2017   Procedure: CYST EXCISION PILONIDAL;  Surgeon: Clovis Riley, MD;  Location: [facility];  Service: General;  Laterality: N/A;   UPPER GASTROINTESTINAL ENDOSCOPY     WISDOM TOOTH EXTRACTION  2015     Family History: Family History  Problem Relation Age of Onset   Asthma Mother    Irritable bowel syndrome Mother    Endometriosis Mother    Colon cancer Mother    COPD Mother    Hypertension Father    Colon polyps Father    Endometriosis Sister    Irritable bowel syndrome Sister    Diverticulitis Sister    Heart attack Maternal Grandmother    Breast cancer Paternal Grandfather    Colon cancer Maternal Uncle    Liver cancer Neg Hx    Rectal cancer Neg Hx    Pancreatic cancer Neg Hx    Stomach cancer Neg Hx    Esophageal cancer Neg Hx     Social History: Social History   Socioeconomic History   Marital status: Single  Spouse name: Not on file   Number of children: Not on file   Years of education: Not on file   Highest education level: Not on file  Occupational History   Not on file  Tobacco Use   Smoking status: Former    Packs/day: 0.50    Years: 5.00    Pack years: 2.50    Types: Cigarettes, E-cigarettes    Quit date: 05/15/2017    Years since quitting: 3.8   Smokeless tobacco: Never   Tobacco comments:    07-15-2017  per pt quit smoking cig. 05-15-2017 but occasionally vapes  Vaping Use   Vaping Use: Former  Substance and Sexual Activity   Alcohol use: Not Currently    Comment: rarely   Drug use: No   Sexual activity: Yes    Birth control/protection: None    Comment: Last menstral  cycle approximately 2 weeks ago "Late August".  Pt denies being pregnant.  Other Topics Concern   Not on file  Social History Narrative   Not on file   Social Determinants of Health   Financial Resource Strain: Not on file  Food Insecurity: Not on file  Transportation Needs: Not on file  Physical Activity: Not on file  Stress: Not on file  Social Connections: Not on file    Allergies: Allergies  Allergen Reactions   Bactrim [Sulfamethoxazole-Trimethoprim] Nausea And Vomiting   Bismuth-Containing Compounds Nausea And Vomiting   Doxycycline Nausea And Vomiting   Vancomycin Itching    Severe pruritus of scalp, flushing   Vantin [Cefpodoxime] Other (See Comments)    "unknown childhood reaction"    Outpatient Meds: Current Outpatient Medications  Medication Sig Dispense Refill   Adalimumab (HUMIRA PEN) 40 MG/0.4ML PNKT Inject 1 pen into the skin once a week. 4 each 6   albuterol (VENTOLIN HFA) 108 (90 Base) MCG/ACT inhaler Inhale into the lungs.     amitriptyline (ELAVIL) 10 MG tablet TAKE 1 TABLET BY MOUTH AT BEDTIME 90 tablet 1   BIOTIN PO Take by mouth.     cetirizine (ZYRTEC) 10 MG tablet Take 10 mg by mouth daily.     diclofenac Sodium (VOLTAREN) 1 % GEL Apply 2-3 g topically 4 (four) times daily as needed.     dicyclomine (BENTYL) 10 MG capsule 1 tablet 2-3 times a day as needed for abdominal cramps 60 capsule 1   fluticasone-salmeterol (ADVAIR DISKUS) 250-50 MCG/ACT AEPB Inhale 1 puff into the lungs in the morning and at bedtime. 3 each 1   hydrocortisone cream 1 % Apply to affected area 2 times daily 15 g 0   lubiprostone (AMITIZA) 8 MCG capsule Take 1 capsule (8 mcg total) by mouth 2 (two) times daily with a meal. 60 capsule 3   minocycline (MINOCIN) 100 MG capsule Take 100 mg by mouth 2 (two) times daily.     montelukast (SINGULAIR) 10 MG tablet Take 10 mg by mouth daily.     omeprazole (PRILOSEC) 40 MG capsule TAKE 1 CAPSULE (40 MG TOTAL) BY MOUTH DAILY. 90 capsule 1    ondansetron (ZOFRAN) 4 MG tablet Take 1 tablet (4 mg total) by mouth every 6 (six) hours. 12 tablet 0   PEG-KCl-NaCl-NaSulf-Na Asc-C (PLENVU) 140 g SOLR Take 140 g by mouth as directed. Manufacturer's coupon Universal coupon code:BIN: P2366821; GROUP: [id number]; PCN: [id number]; ID: [id number]; PAY NO MORE $50 1 each 0   predniSONE (DELTASONE) 5 MG tablet Take 8 tablets (40 mg total) by mouth daily with breakfast for 14  days, THEN 7 tablets (35 mg total) daily with breakfast for 7 days, THEN 6 tablets (30 mg total) daily with breakfast for 7 days, THEN 5 tablets (25 mg total) daily with breakfast for 7 days, THEN 4 tablets (20 mg total) daily with breakfast for 7 days, THEN 3 tablets (15 mg total) daily with breakfast for 7 days, THEN 2 tablets (10 mg total) daily with breakfast for 7 days, THEN 1 tablet (5 mg total) daily with breakfast for 7 days. 308 tablet 0   sertraline (ZOLOFT) 50 MG tablet Take 1 tablet by mouth daily.     triamcinolone cream (KENALOG) 0.5 % Apply 1 application topically 3 (three) times daily.     Zinc Sulfate (ZINC 15 PO) Take by mouth every morning.     No current facility-administered medications for this visit.      ___________________________________________________________________ Objective   Exam:  BP 110/70 (BP Location: Left Arm, Patient Position: Sitting, Cuff Size: Normal)    Pulse 88    Ht 5\' 1"  (1.549 m) Comment: height measured without shoes   Wt 165 lb 6 oz (75 kg)    LMP 03/01/2021    BMI 31.25 kg/m  Wt Readings from Last 3 Encounters:  03/30/21 165 lb 6 oz (75 kg)  03/24/21 165 lb (74.8 kg)  02/05/21 170 lb (77.1 kg)    General: Not acutely ill-appearing, comfortable and conversational, well-hydrated Eyes: sclera anicteric, no redness ENT: oral mucosa moist without lesions, no cervical or supraclavicular lymphadenopathy CV: RRR without murmur, S1/S2, no JVD, no peripheral edema Resp: clear to auscultation bilaterally, normal RR and effort noted GI:  soft, mild lower tenderness, with active bowel sounds. No guarding or palpable organomegaly noted. Skin; warm and dry, no rash or jaundice noted Neuro: awake, alert and oriented x 3. Normal gross motor function and fluent speech  Labs:  CBC Latest Ref Rng & Units 03/24/2021 10/16/2020 06/28/2020  WBC 4.0 - 10.5 K/uL 9.2 7.0 8.1  Hemoglobin 12.0 - 15.0 g/dL 13.3 13.4 10.4(L)  Hematocrit 36.0 - 46.0 % 40.5 40.6 32.2(L)  Platelets 150 - 400 K/uL 420(H) 377.0 473.0(H)   CMP Latest Ref Rng & Units 03/24/2021 06/16/2020 11/25/2019  Glucose 70 - 99 mg/dL 90 97 108(H)  BUN 6 - 20 mg/dL 9 10 9   Creatinine 0.44 - 1.00 mg/dL 0.52 0.50 0.69  Sodium 135 - 145 mmol/L 136 135 138  Potassium 3.5 - 5.1 mmol/L 3.6 3.8 3.4(L)  Chloride 98 - 111 mmol/L 103 102 103  CO2 22 - 32 mmol/L 25 23 26   Calcium 8.9 - 10.3 mg/dL 8.8(L) 9.3 9.1  Total Protein 6.5 - 8.1 g/dL 7.8 7.7 7.8  Total Bilirubin 0.3 - 1.2 mg/dL 0.6 <0.1(L) 0.6  Alkaline Phos 38 - 126 U/L 102 107 91  AST 15 - 41 U/L 14(L) 23 16  ALT 0 - 44 U/L 10 23 13      Radiologic Studies:  CLINICAL DATA:  LEFT lower quadrant abdominal pain. History of ulcerative colitis. Rectal bleeding.   EXAM: CT ABDOMEN AND PELVIS WITH CONTRAST   TECHNIQUE: Multidetector CT imaging of the abdomen and pelvis was performed using the standard protocol following bolus administration of intravenous contrast.   RADIATION DOSE REDUCTION: This exam was performed according to the departmental dose-optimization program which includes automated exposure control, adjustment of the mA and/or kV according to patient size and/or use of iterative reconstruction technique.   CONTRAST:  138mL OMNIPAQUE IOHEXOL 300 MG/ML  SOLN   COMPARISON:  CT abdomen dated 11/25/2019   FINDINGS: Lower chest: No acute abnormality.   Hepatobiliary: No focal liver abnormality is seen. Gallbladder appears normal. No bile duct dilatation.   Pancreas: Unremarkable. No pancreatic ductal  dilatation or surrounding inflammatory changes.   Spleen: Normal in size without focal abnormality.   Adrenals/Urinary Tract: Adrenal glands appear normal. Kidneys are unremarkable without mass, stone or hydronephrosis. No ureteral or bladder calculi are identified. Bladder is unremarkable, partially decompressed.   Stomach/Bowel: No dilated large or small bowel loops. Thickening of the walls of the rectosigmoid colon and lower descending colon, with pericolonic inflammation/fluid stranding. Fluid, with associated air-fluid levels, throughout the remainder of the colon. Stomach is unremarkable.   Vascular/Lymphatic: Numerous small and moderate-sized lymph nodes within the central pelvic mesentery and LEFT hemipelvis, likely reactive in nature. Additional mildly prominent lymph nodes within the periaortic retroperitoneum, also likely reactive in nature.   No vascular abnormality seen.   Reproductive: Uterus and bilateral adnexa are unremarkable.   Other: No abscess collection.  No free intraperitoneal air.   Musculoskeletal: Osseous structures of the abdomen and pelvis are unremarkable.   IMPRESSION: 1. Thickening of the walls of the rectosigmoid colon and lower descending colon, with pericolonic inflammation/fluid stranding. Findings are consistent with the given history of ulcerative colitis, an acute exacerbation. No abscess collection. No free intraperitoneal air. No bowel obstruction. 2. Fluid, and associated air-fluid levels throughout the more proximal colon, compatible with associated ileus. 3. Reactive lymphadenopathy within the pelvis and retroperitoneum.     Electronically Signed   By: Franki Cabot M.D.   On: 03/24/2021 19:03  (CT images personally reviewed -HD)  Assessment: Encounter Diagnoses  Name Primary?   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  It has  previously been a focal proctitis at the rectosigmoid junction, though on prior CT there is been significant wall thickening.  Now with worsening symptoms a CT suggest extension into the more proximal left colon.  She is on antibiotics, raising concern for C. difficile.  If that is negative, then she appears to be failing Humira.  Plan:  Prednisone 40 mg daily for 2 weeks, then 30 mg daily for a week, then 20 mg daily for a week, then 10 mg daily for a week, then stop (new prescription sent)  As needed use of Percocet, use sparingly, hopefully this can keep her out of the ED if she has further flare.  Humira drug and antibody level today  GI pathogen panel, she believes she could return the specimen within a few days.  We have tentatively planned a colonoscopy with me in about 3 weeks, needs C. difficile result first, will cancel procedure if C. difficile positive.  We also discussed the need for consultation with an IBD specialist at either [facility] or [facility].  She is agreeable to that, would like to check her insurance to see if one center is preferred over the other, then will message me next week and I can send records with a referral and contact a  provider accordingly.   50 minutes were spent on this encounter (including chart review, history/exam, counseling/coordination of care, and documentation) > 50% of that time was spent on counseling and coordination of care.   [name]  CC: Referring provider noted above

## 2021-04-03 ENCOUNTER — Ambulatory Visit: Payer: Self-pay | Admitting: Gastroenterology

## 2021-04-03 ENCOUNTER — Other Ambulatory Visit: Payer: 59

## 2021-04-03 DIAGNOSIS — K51211 Ulcerative (chronic) proctitis with rectal bleeding: Secondary | ICD-10-CM

## 2021-04-05 ENCOUNTER — Encounter: Payer: Self-pay | Admitting: Gastroenterology

## 2021-04-05 ENCOUNTER — Telehealth: Payer: Self-pay | Admitting: Gastroenterology

## 2021-04-05 LAB — GI PROFILE, STOOL, PCR

## 2021-04-05 MED ORDER — FIDAXOMICIN 200 MG PO TABS: 200.0000 mg | ORAL_TABLET | Freq: Two times a day (BID) | ORAL | 0 refills | Status: AC

## 2021-04-05 NOTE — Telephone Encounter (Signed)
See 04/03/21 stool result note  C difficile toxin A/B Not Detected Detected -Abnormal

## 2021-04-05 NOTE — Telephone Encounter (Signed)
This explains her recent flare of colitis symptoms.  Plan:  Stop prednisone  Stop minocycline (I believe she was on it for skin problems, so she will need to let her prescribing physician know that)  Vancomycin 125 mg tablet 1 tablet 4 times daily x10 days Dispense 40 tablets, 0 refill  -Start taking this today  Cancel upcoming colonoscopy  Office visit with me 3 to 4 weeks -by then I should have the results of the Humira drug and antibody levels.  - HD

## 2021-04-05 NOTE — Telephone Encounter (Signed)
Called and spoke with patient regarding her stool study results and Dr. Loletha Carrow' recommendations as outlined below. Pt has been advised to stop Prednisone and Minocycline. Pt knows to let prescribing MD for Minocycline know that she is stopping it now. Pt knows to begin Vancomycin today, pt requested that the prescription be sent to [facility] She is aware that [facility] colonoscopy has been cancelled. She has been scheduled for a 3-week f/u appt with Dr. Loletha Carrow on Friday, 04/27/21 at 2:20 pm. Pt is aware that I will send all of this information to her my chart as well. . Pt verbalized understanding and had no concerns at the end of the call.

## 2021-04-05 NOTE — Telephone Encounter (Signed)
[facility] called with an alert on results.

## 2021-04-05 NOTE — Telephone Encounter (Signed)
Noted. Pt notified via my chart.

## 2021-04-05 NOTE — Telephone Encounter (Signed)
Thank you for the note.  The severity of her reported vancomycin allergy is uncertain, and this infection only responds to a small umber of antibiotics.  But to be on the safe side, I canceled the vancomycin prescription and sent one for fidaxomicin twice daily for 10 days.  - HD

## 2021-04-08 LAB — SERIAL MONITORING

## 2021-04-09 ENCOUNTER — Encounter: Payer: Self-pay | Admitting: Gastroenterology

## 2021-04-09 LAB — ADALIMUMAB+AB (SERIAL MONITOR)
Adalimumab Drug Level: 5.6 ug/mL
Anti-Adalimumab Antibody: <25 ng/mL

## 2021-04-27 ENCOUNTER — Ambulatory Visit: Payer: 59 | Admitting: Gastroenterology

## 2021-04-27 ENCOUNTER — Encounter: Payer: Self-pay | Admitting: Gastroenterology

## 2021-04-27 VITALS — BP 110/86 | HR 87 | Ht 61.0 in | Wt 162.0 lb

## 2021-04-27 DIAGNOSIS — K219 Gastro-esophageal reflux disease without esophagitis: Secondary | ICD-10-CM

## 2021-04-27 DIAGNOSIS — K625 Hemorrhage of anus and rectum: Secondary | ICD-10-CM

## 2021-04-27 DIAGNOSIS — K51211 Ulcerative (chronic) proctitis with rectal bleeding: Secondary | ICD-10-CM

## 2021-04-27 DIAGNOSIS — K5909 Other constipation: Secondary | ICD-10-CM | POA: Diagnosis not present

## 2021-04-27 DIAGNOSIS — Z7960 Long term (current) use of unspecified immunomodulators and immunosuppressants: Secondary | ICD-10-CM

## 2021-04-27 DIAGNOSIS — R1030 Lower abdominal pain, unspecified: Secondary | ICD-10-CM | POA: Diagnosis not present

## 2021-04-27 MED ORDER — PLENVU 140 G PO SOLR: 140.0000 g | ORAL | 0 refills | Status: DC

## 2021-04-27 MED ORDER — LANSOPRAZOLE 30 MG PO CPDR: 30.0000 mg | DELAYED_RELEASE_CAPSULE | Freq: Every day | ORAL | 3 refills | Status: DC

## 2021-04-27 NOTE — Progress Notes (Signed)
? ? ? ?[facility] Progress Note ? ?Chief Complaint: Ulcerative proctitis and C. difficile ? ?Subjective  ?History: ?Artia follows up for her ulcerative proctitis that has been difficult to control on first vedolizumab and now weekly Humira.  She had significantly worsened symptoms of abdominal pain diarrhea and bleeding when I saw her February 10 after an ED visit shortly before that showed left-sided colitis.  Colonoscopy was tentatively planned, but her stool studies returned positive for C. difficile that was treated with fidaxomicin since she had a reported rash reaction to previous use of vancomycin. ? ?Lachelle is glad to report she is feeling much better after completing the C. difficile treatment.  She has much less abdominal pain and bloating, and is now back to what she considers to be her baseline of a few months ago.  That is a fairly constant 3 out of 10 lower abdominal/pelvic discomfort, several passages of bloody mucus per day and typically 1 BM per day.  If she develops constipation she will take a Linzess.  She is also bothered by persistent heartburn and regurgitation and feels that the daily omeprazole may no longer be working very well.  Denies dysphagia or odynophagia. ? ?ROS: ?Cardiovascular:  no chest pain ?Respiratory: no dyspnea ? ?The patient's Past Medical, Family and Social History were reviewed and are on file in the EMR. ? ?Objective: ? ?Med list reviewed ? ?Current Outpatient Medications:  ?  Adalimumab (HUMIRA PEN) 40 MG/0.4ML PNKT, Inject 1 pen into the skin once a week., Disp: 4 each, Rfl: 6 ?  albuterol (VENTOLIN HFA) 108 (90 Base) MCG/ACT inhaler, Inhale into the lungs., Disp: , Rfl:  ?  amitriptyline (ELAVIL) 10 MG tablet, TAKE 1 TABLET BY MOUTH AT BEDTIME, Disp: 90 tablet, Rfl: 1 ?  BIOTIN PO, Take by mouth., Disp: , Rfl:  ?  cetirizine (ZYRTEC) 10 MG tablet, Take 10 mg by mouth daily., Disp: , Rfl:  ?  diclofenac Sodium (VOLTAREN) 1 % GEL, Apply 2-3 g topically 4 (four)  times daily as needed., Disp: , Rfl:  ?  dicyclomine (BENTYL) 10 MG capsule, 1 tablet 2-3 times a day as needed for abdominal cramps, Disp: 60 capsule, Rfl: 1 ?  fluticasone-salmeterol (ADVAIR DISKUS) 250-50 MCG/ACT AEPB, Inhale 1 puff into the lungs in the morning and at bedtime., Disp: 3 each, Rfl: 1 ?  hydrocortisone cream 1 %, Apply to affected area 2 times daily, Disp: 15 g, Rfl: 0 ?  montelukast (SINGULAIR) 10 MG tablet, Take 10 mg by mouth daily., Disp: , Rfl:  ?  Multiple Vitamin (MULTIVITAMIN) capsule, Take 1 capsule by mouth daily. Immune supplement, Disp: , Rfl:  ?  omeprazole (PRILOSEC) 40 MG capsule, TAKE 1 CAPSULE (40 MG TOTAL) BY MOUTH DAILY., Disp: 90 capsule, Rfl: 1 ?  ondansetron (ZOFRAN) 4 MG tablet, Take 1 tablet (4 mg total) by mouth every 6 (six) hours., Disp: 12 tablet, Rfl: 0 ?  oxyCODONE-acetaminophen (PERCOCET) 5-325 MG tablet, Take 1-2 tablets by mouth every 6 (six) hours as needed for severe pain., Disp: 20 tablet, Rfl: 0 ?  sertraline (ZOLOFT) 50 MG tablet, Take 1 tablet by mouth daily., Disp: , Rfl:  ?  triamcinolone cream (KENALOG) 0.5 %, Apply 1 application topically 3 (three) times daily., Disp: , Rfl:  ?  Zinc Sulfate (ZINC 15 PO), Take by mouth every morning., Disp: , Rfl:  ?  lubiprostone (AMITIZA) 8 MCG capsule, Take 1 capsule (8 mcg total) by mouth 2 (two) times daily with a meal. (  Patient not taking: Reported on 04/27/2021), Disp: 60 capsule, Rfl: 3 ? ? ?Vital signs in last 24 hrs: ?Vitals:  ? 04/27/21 1355  ?BP: 110/86  ?Pulse: 87  ?SpO2: 98%  ? ?Wt Readings from Last 3 Encounters:  ?04/27/21 162 lb (73.5 kg)  ?03/30/21 165 lb 6 oz (75 kg)  ?03/24/21 165 lb (74.8 kg)  ?  ?Physical Exam ? ?Well-appearing ?HEENT: sclera anicteric, oral mucosa moist without lesions.  No eye redness ?Neck: supple, no thyromegaly, JVD or lymphadenopathy ?Cardiac: RRR without murmurs, S1S2 heard, no peripheral edema ?Pulm: clear to auscultation bilaterally, normal RR and effort noted ?Abdomen: soft,  no tenderness, with active bowel sounds. No guarding or palpable hepatosplenomegaly. ?Skin; warm and dry, no jaundice or rash ? ?Labs: ? ?Humira level in therapeutic range with no detectable antibodies (level was 5.6, up from 3.0 when she had been on every other week therapy) ?___________________________________________ ?Radiologic studies: ? ? ?____________________________________________ ?Other: ? ? ?_____________________________________________ ?Assessment & Plan  ?Assessment: ?Encounter Diagnoses  ?Name Primary?  ? Ulcerative proctitis with rectal bleeding ([place]) Yes  ? Lower abdominal pain   ? Rectal bleeding   ? Chronic constipation   ? Gastroesophageal reflux disease, unspecified whether esophagitis present   ? Long-term use of immunosuppressant medication   ? ? ?She has recovered from the C. difficile and is back to what sounds like her baseline proctitis symptoms.  She also has curiously also had episodic constipation despite proctitis.  She seems to have likely failed Humira at escalating doses with therapeutic level and no antibodies on recent testing. ?I feel she needs a sigmoidoscopy to evaluate the proctitis activity and confirm that she has endoscopic failure as well. ?If so, then at this time for consult with an IBD specialist, and she is agreeable to seeing Dr. [name] at [facility]. ?As I see it, next choice of medicines after Humira would be either Huel Coventry or Henrietta Dine. ? ?Her colitis has had the endoscopic appearance of a limited segment of ulcerative colitis at the rectosigmoid junction, though on imaging over many years there appears to be a transmural inflammatory component and some adjacent adenopathy, suggesting a more Crohn's-like appearance.  This may account for why it has been more difficult to get under control with biologic therapy. ? ?Plan: ?Change omeprazole to once daily lansoprazole 30 mg (she checked her insurance formulary). ? ?Sigmoidoscopy in 4 to 5 weeks.  She was agreeable  after discussion of procedure and risks. ? The benefits and risks of the planned procedure were described in detail with the patient or (when appropriate) their health care proxy.  Risks were outlined as including, but not limited to, bleeding, infection, perforation, adverse medication reaction leading to cardiac or pulmonary decompensation, pancreatitis (if ERCP).  The limitation of incomplete mucosal visualization was also discussed.  No guarantees or warranties were given. ? ?Continue Humira 40 mg once weekly for now. ? ?As needed Linzess ? ?35 minutes were spent on this encounter (including chart review, history/exam, counseling/coordination of care, and documentation) > 50% of that time was spent on counseling and coordination of care.  ? ?[name] ? ?

## 2021-04-27 NOTE — Patient Instructions (Addendum)
If you are age 27 or older, your body mass index should be between 23-30. Your Body mass index is 30.61 kg/m?Marland Kitchen If this is out of the aforementioned range listed, please consider follow up with your Primary Care Provider. ? ?If you are age 74 or younger, your body mass index should be between 19-25. Your Body mass index is 30.61 kg/m?Marland Kitchen If this is out of the aformentioned range listed, please consider follow up with your Primary Care Provider.  ? ?________________________________________________________ ? ?The [facility] providers would like to encourage you to use [facility] to communicate with providers for non-urgent requests or questions.  Due to long hold times on the telephone, sending your provider a message by [facility] may be a faster and more efficient way to get a response.  Please allow 48 business hours for a response.  Please remember that this is for non-urgent requests.  ?_______________________________________________________ ? ?If you are age 33 or older, your body mass index should be between 23-30. Your Body mass index is 30.61 kg/m?Marland Kitchen If this is out of the aforementioned range listed, please consider follow up with your Primary Care Provider. ? ?If you are age 36 or younger, your body mass index should be between 19-25. Your Body mass index is 30.61 kg/m?Marland Kitchen If this is out of the aformentioned range listed, please consider follow up with your Primary Care Provider.  ? ?________________________________________________________ ? ?The [facility] providers would like to encourage you to use [facility] to communicate with providers for non-urgent requests or questions.  Due to long hold times on the telephone, sending your provider a message by [facility] may be a faster and more efficient way to get a response.  Please allow 48 business hours for a response.  Please remember that this is for non-urgent requests.  ?_______________________________________________________ ? ?It was a pleasure to see you  today! ? ?Thank you for trusting me with your gastrointestinal care!   ?  ?

## 2021-05-10 ENCOUNTER — Encounter: Payer: Self-pay | Admitting: Gastroenterology

## 2021-05-11 ENCOUNTER — Encounter: Payer: Self-pay | Admitting: Gastroenterology

## 2021-05-15 ENCOUNTER — Other Ambulatory Visit: Payer: Self-pay

## 2021-05-15 ENCOUNTER — Other Ambulatory Visit: Payer: Self-pay | Admitting: Gastroenterology

## 2021-05-15 MED ORDER — LINACLOTIDE 72 MCG PO CAPS: 72.0000 ug | ORAL_CAPSULE | Freq: Every day | ORAL | 1 refills | Status: DC

## 2021-05-16 ENCOUNTER — Encounter: Payer: 59 | Admitting: Gastroenterology

## 2021-05-21 ENCOUNTER — Encounter: Payer: Self-pay | Admitting: Gastroenterology

## 2021-05-22 ENCOUNTER — Encounter: Payer: Self-pay | Admitting: Certified Registered Nurse Anesthetist

## 2021-05-24 ENCOUNTER — Telehealth: Payer: Self-pay

## 2021-05-24 NOTE — Telephone Encounter (Signed)
PA for Humira 17m/0.4ml pen ?CMM Key: [id number] PA Case ID: [id number] Rx #: [id number]?

## 2021-05-28 NOTE — Telephone Encounter (Signed)
PA Case: [id number], Status: Approved, Coverage Starts on: 05/24/2021 12:00 AM, Coverage Ends on: 05/25/2022 12:00 AM.  ?

## 2021-05-29 ENCOUNTER — Encounter: Payer: Self-pay | Admitting: Gastroenterology

## 2021-05-29 ENCOUNTER — Ambulatory Visit (AMBULATORY_SURGERY_CENTER): Payer: 59 | Admitting: Gastroenterology

## 2021-05-29 ENCOUNTER — Telehealth: Payer: Self-pay | Admitting: Gastroenterology

## 2021-05-29 VITALS — BP 109/74 | HR 72 | Temp 98.0°F | Resp 21 | Ht 61.0 in | Wt 162.0 lb

## 2021-05-29 DIAGNOSIS — K51211 Ulcerative (chronic) proctitis with rectal bleeding: Secondary | ICD-10-CM | POA: Diagnosis present

## 2021-05-29 DIAGNOSIS — K611 Rectal abscess: Secondary | ICD-10-CM | POA: Diagnosis not present

## 2021-05-29 DIAGNOSIS — K50011 Crohn's disease of small intestine with rectal bleeding: Secondary | ICD-10-CM | POA: Diagnosis not present

## 2021-05-29 MED ORDER — SODIUM CHLORIDE 0.9 % IV SOLN: 500.0000 mL | Freq: Once | INTRAVENOUS | Status: DC

## 2021-05-29 NOTE — Patient Instructions (Signed)
Please read handouts provided. ?Resume previous diet. ?Discontinue Humira. ?Await pathology results. ? ? ?YOU HAD AN ENDOSCOPIC PROCEDURE TODAY AT [facility]:   Refer to the procedure report that was given to you for any specific questions about what was found during the examination.  If the procedure report does not answer your questions, please call your gastroenterologist to clarify.  If you requested that your care partner not be given the details of your procedure findings, then the procedure report has been included in a sealed envelope for you to review at your convenience later. ? ?YOU SHOULD EXPECT: Some feelings of bloating in the abdomen. Passage of more gas than usual.  Walking can help get rid of the air that was put into your GI tract during the procedure and reduce the bloating. If you had a lower endoscopy (such as a colonoscopy or flexible sigmoidoscopy) you may notice spotting of blood in your stool or on the toilet paper. If you underwent a bowel prep for your procedure, you may not have a normal bowel movement for a few days. ? ?Please Note:  You might notice some irritation and congestion in your nose or some drainage.  This is from the oxygen used during your procedure.  There is no need for concern and it should clear up in a day or so. ? ?SYMPTOMS TO REPORT IMMEDIATELY: ? ?Following lower endoscopy (colonoscopy or flexible sigmoidoscopy): ? Excessive amounts of blood in the stool ? Significant tenderness or worsening of abdominal pains ? Swelling of the abdomen that is new, acute ? Fever of 100?F or higher ? ? ? ?For urgent or emergent issues, a gastroenterologist can be reached at any hour by calling [phone]. ?Do not use MyChart messaging for urgent concerns.  ? ? ?DIET:  We do recommend a small meal at first, but then you may proceed to your regular diet.  Drink plenty of fluids but you should avoid alcoholic beverages for 24 hours. ? ?ACTIVITY:  You should plan to  take it easy for the rest of today and you should NOT DRIVE or use heavy machinery until tomorrow (because of the sedation medicines used during the test).   ? ?FOLLOW UP: ?Our staff will call the number listed on your records 48-72 hours following your procedure to check on you and address any questions or concerns that you may have regarding the information given to you following your procedure. If we do not reach you, we will leave a message.  We will attempt to reach you two times.  During this call, we will ask if you have developed any symptoms of COVID 19. If you develop any symptoms (ie: fever, flu-like symptoms, shortness of breath, cough etc.) before then, please call [phone].  If you test positive for Covid 19 in the 2 weeks post procedure, please call and report this information to [place].   ? ?If any biopsies were taken you will be contacted by phone or by letter within the next 1-3 weeks.  Please call us at [phone] if you have not heard about the biopsies in 3 weeks.  ? ? ?SIGNATURES/CONFIDENTIALITY: ?You and/or your care partner have signed paperwork which will be entered into your electronic medical record.  These signatures attest to the fact that that the information above on your After Visit Summary has been reviewed and is understood.  Full responsibility of the confidentiality of this discharge information lies with you and/or your care-partner.  ?

## 2021-05-29 NOTE — Progress Notes (Signed)
Called to room to assist during endoscopic procedure.  Patient ID and intended procedure confirmed with present staff. Received instructions for my participation in the procedure from the performing physician.  

## 2021-05-29 NOTE — Telephone Encounter (Signed)
Called pt back. States stool is more clear now. States it's a pale yellow and all liquid. Pt for sigmoidoscopy today at 4pm. Instructed pt that it sounds like she is prepped adequately for the procedure today.  ?

## 2021-05-29 NOTE — Progress Notes (Signed)
Report given to PACU, vss 

## 2021-05-29 NOTE — Op Note (Signed)
[place] ?Patient Name: Marissa Morales ?Procedure Date: 05/29/2021 3:16 PM ?MRN: [id number] ?Endoscopist: Estill Cotta. 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                          initial Dx, failed vedolizumab and now weekly  ?                          Humira with adequate drug level and no antibodies. ?                          persistent rectal pain, bleeding and mucus ?                          Completed fidaxomicin for C diff over 4 weeks ago ?Medicines:                Monitored Anesthesia Care ?Procedure:                Pre-Anesthesia Assessment: ?                          - Prior to the procedure, a History and Physical  ?                          was performed, and patient medications and  ?                          allergies were reviewed. The patient's tolerance of  ?                          previous anesthesia was also reviewed. The risks  ?                          and benefits of the procedure and the sedation  ?                          options and risks were discussed with the patient.  ?                          All questions were answered, and informed consent  ?                          was obtained. Prior Anticoagulants: The patient has  ?                          taken no previous anticoagulant or antiplatelet  ?  agents. ASA Grade Assessment: II - A patient with  ?                          mild systemic disease. After reviewing the risks  ?                          and benefits, the patient was deemed in  ?                          satisfactory condition to undergo the procedure. ?                          After obtaining informed consent, the colonoscope  ?                          was passed under  direct vision. Throughout the  ?                          procedure, the patient's blood pressure, pulse, and  ?                          oxygen saturations were monitored continuously. The  ?                          CF [id number] #[id number] was introduced through the anus  ?                          and advanced to the the terminal ileum, with  ?                          identification of the appendiceal orifice and IC  ?                          valve. The colonoscopy was performed without  ?                          difficulty. The patient tolerated the procedure  ?                          well. The quality of the bowel preparation was good  ?                          except the ascending colon was fair. The terminal  ?                          ileum, ileocecal valve, appendiceal orifice, and  ?                          rectum were photographed. The bowel preparation  ?                          used was Miralax. ?Scope In: 3:29:55 PM ?Scope Out: 3:46:09 PM ?Scope Withdrawal Time: 0 hours 14 minutes 12 seconds  ?Total  Procedure Duration: 0 hours 16 minutes 14 seconds  ?Findings:                 The perianal and digital rectal examinations were  ?                          normal. ?                          Patchy inflammation, mild in severity and  ?                          characterized by congestion (edema), erythema,  ?                          granularity, loss of vascularity and mucus was  ?                          found in the terminal ileum. Biopsies were taken  ?                          with a cold forceps for histology. This is a new  ?                          finding compared with prior exams. ?                          Inflammation was found in the proximal to mid  ?                          rectum. Begins at rectosigmoid junction (about 28  ?                          from anal verge) and ends between mid and distal  ?                          rectal valve. This was graded as Mayo Score 2-3,  ?                           and when compared to the last examination, the  ?                          findings are worsened. Similar appearance to  ?                          initial colonoscopy findings in 04/2019. Biopsies  ?                          were taken with a cold forceps for histology. ?                          The exam was otherwise without abnormality. ?Complications:            No immediate complications. ?Estimated Blood Loss:     Estimated blood loss was minimal. ?Impression:               -  Ileitis. Biopsied. ?                          - Moderately active (Mayo Score 2-3) proctitis  ?                          ulcerative colitis, worsened since the last  ?                          examination. Biopsied. ?                          - The examination was otherwise normal. ?Recommendation:           - Patient has a contact number available for  ?                          emergencies. The signs and symptoms of potential  ?                          delayed complications were discussed with the  ?                          patient. Return to normal activities tomorrow.  ?                          Written discharge instructions were provided to the  ?                          patient. ?                          - Resume previous diet. ?                          - Discontinue Humnira ?                          Plans will be made to initiate Stelara. ?                          Move forward with plans for consultation with IBD  ?                          specialist at [facility]. ?                          Mesalamine enemas will be prescribed (perhaps new  ?                          insurance will approve) for short term use to  ?                          improve proctitis. ?Becker Christopher L. Loletha Carrow, MD ?05/29/2021 4:07:07 PM ?This report has been signed electronically. ?

## 2021-05-29 NOTE — Progress Notes (Signed)
History and Physical: ? This patient presents for endoscopic testing for: ?Encounter Diagnosis  ?Name Primary?  ? Ulcerative proctitis with rectal bleeding ([place]) Yes  ? ? ?Clinical details in my office note of 04/27/21. ?UC with persistent pelvic pain and bleeding.  Evaluating disease activity. ? ?ROS: ?Patient denies chest pain or shortness of breath ? ? ?Past Medical History: ?Past Medical History:  ?Diagnosis Date  ? Allergy   ? cats  ? Anemia   ? Anxiety   ? panic attacks  ? Asthma   ? as a child - dirty cats will cause an asthma attack  ? Depression   ? GERD (gastroesophageal reflux disease)   ? Hemorrhoids   ? History of asthma   ? childhood  ? IBS (irritable bowel syndrome)   ? Pilonidal cyst   ? Wears glasses   ? ? ? ?Past Surgical History: ?Past Surgical History:  ?Procedure Laterality Date  ? ANAL RECTAL MANOMETRY N/A 07/24/2020  ? Procedure: ANO RECTAL MANOMETRY;  Surgeon: Doran Stabler, MD;  Location: [facility];  Service: Gastroenterology;  Laterality: N/A;  ? COLONOSCOPY    ? COLONOSCOPY WITH ESOPHAGOGASTRODUODENOSCOPY (EGD)  x2  last one 2015  ? KNEE ARTHROSCOPY Left 03/30/2020  ? PILONIDAL CYST EXCISION N/A 07/18/2017  ? Procedure: CYST EXCISION PILONIDAL;  Surgeon: Clovis Riley, MD;  Location: [place];  Service: General;  Laterality: N/A;  ? UPPER GASTROINTESTINAL ENDOSCOPY    ? [place] EXTRACTION  2015  ? ? ?Allergies: ?Allergies  ?Allergen Reactions  ? Bactrim [Sulfamethoxazole-Trimethoprim] Nausea And Vomiting  ? Bismuth-Containing Compounds Nausea And Vomiting  ? Doxycycline Nausea And Vomiting  ? Vancomycin Itching  ?  Severe pruritus of scalp, flushing  ? Vantin [Cefpodoxime] Other (See Comments)  ?  "unknown childhood reaction"  ? ? ?Outpatient Meds: ?Current Outpatient Medications  ?Medication Sig Dispense Refill  ? Adalimumab (HUMIRA PEN) 40 MG/0.4ML PNKT Inject 1 pen into the skin once a week. 4 each 6  ? amitriptyline (ELAVIL) 10 MG tablet TAKE 1 TABLET  BY MOUTH AT BEDTIME 90 tablet 1  ? cetirizine (ZYRTEC) 10 MG tablet Take 10 mg by mouth daily.    ? fluticasone-salmeterol (ADVAIR DISKUS) 250-50 MCG/ACT AEPB Inhale 1 puff into the lungs in the morning and at bedtime. 3 each 1  ? linaclotide (LINZESS) 72 MCG capsule Take 1 capsule (72 mcg total) by mouth daily before breakfast. 90 capsule 1  ? montelukast (SINGULAIR) 10 MG tablet Take 10 mg by mouth daily.    ? sertraline (ZOLOFT) 50 MG tablet Take 1 tablet by mouth daily.    ? albuterol (VENTOLIN HFA) 108 (90 Base) MCG/ACT inhaler Inhale into the lungs.    ? BIOTIN PO Take by mouth. (Patient not taking: Reported on 05/29/2021)    ? diclofenac Sodium (VOLTAREN) 1 % GEL Apply 2-3 g topically 4 (four) times daily as needed.    ? dicyclomine (BENTYL) 10 MG capsule 1 tablet 2-3 times a day as needed for abdominal cramps 60 capsule 1  ? hydrocortisone cream 1 % Apply to affected area 2 times daily 15 g 0  ? lansoprazole (PREVACID) 30 MG capsule Take 1 capsule (30 mg total) by mouth daily at 12 noon. 30 capsule 3  ? lubiprostone (AMITIZA) 8 MCG capsule Take 1 capsule (8 mcg total) by mouth 2 (two) times daily with a meal. (Patient not taking: Reported on 04/27/2021) 60 capsule 3  ? Multiple Vitamin (MULTIVITAMIN) capsule Take 1 capsule  by mouth daily. Immune supplement    ? ondansetron (ZOFRAN) 4 MG tablet Take 1 tablet (4 mg total) by mouth every 6 (six) hours. 12 tablet 0  ? oxyCODONE-acetaminophen (PERCOCET) 5-325 MG tablet Take 1-2 tablets by mouth every 6 (six) hours as needed for severe pain. 20 tablet 0  ? triamcinolone cream (KENALOG) 0.5 % Apply 1 application topically 3 (three) times daily.    ? Zinc Sulfate (ZINC 15 PO) Take by mouth every morning.    ? ?Current Facility-Administered Medications  ?Medication Dose Route Frequency Provider Last Rate Last Admin  ? 0.9 %  sodium chloride infusion  500 mL Intravenous Once Doran Stabler, MD       ? ? ? ? ?___________________________________________________________________ ?Objective  ? ?Exam: ? ?BP (!) 146/93   Pulse 92   Temp 98 ?F (36.7 ?C)   Ht 5' 1"  (1.549 m)   Wt 162 lb (73.5 kg)   SpO2 100%   BMI 30.61 kg/m?  ? ?CV: RRR without murmur, S1/S2 ?Resp: clear to auscultation bilaterally, normal RR and effort noted ?GI: soft, no tenderness, with active bowel sounds. ? ? ?Assessment: ?Encounter Diagnosis  ?Name Primary?  ? Ulcerative proctitis with rectal bleeding ([place]) Yes  ? ? ? ?Plan: ?Colonoscopy ? ? ? ?The patient is appropriate for an endoscopic procedure in the ambulatory setting. ? ? - Wilfrid Lund, MD ? ? ? ? ?

## 2021-05-29 NOTE — Telephone Encounter (Signed)
Inbound call from patient stating that she has a procedure today at 4:00 with Dr. Loletha Carrow and that when she uses the bathroom it is not as clear as what it normally is. Patient is seeking advice if she needs to take more prep. Please advise.  ?

## 2021-05-29 NOTE — Progress Notes (Signed)
Pt's states no medical or surgical changes since previsit or office visit. 

## 2021-05-30 ENCOUNTER — Other Ambulatory Visit: Payer: Self-pay | Admitting: Gastroenterology

## 2021-05-30 DIAGNOSIS — K51211 Ulcerative (chronic) proctitis with rectal bleeding: Secondary | ICD-10-CM

## 2021-05-30 MED ORDER — MESALAMINE 4 G RE ENEM: 4.0000 g | ENEMA | Freq: Every day | RECTAL | 1 refills | Status: DC

## 2021-05-31 ENCOUNTER — Telehealth: Payer: Self-pay

## 2021-05-31 ENCOUNTER — Telehealth: Payer: Self-pay | Admitting: *Deleted

## 2021-05-31 NOTE — Telephone Encounter (Signed)
Attempted 2nd f/u call, mailbox is full. ?

## 2021-05-31 NOTE — Telephone Encounter (Signed)
?  Follow up Call- ? ? ?  05/29/2021  ?  3:08 PM 10/24/2020  ?  2:08 PM 04/10/2020  ?  9:23 AM 10/27/2019  ? 10:20 AM 05/13/2019  ?  2:45 PM  ?Call back number  ?Post procedure Call Back phone  # [phone] cell [phone]  ?Permission to leave phone message Yes Yes Yes Yes Yes  ?  ? ?INo answer at 2nd attempt follow up phone call.  Unable to leave a message d/t no VM.  ?

## 2021-05-31 NOTE — Telephone Encounter (Signed)
?  Follow up Call- ? ? ?  05/29/2021  ?  3:08 PM 10/24/2020  ?  2:08 PM 04/10/2020  ?  9:23 AM 10/27/2019  ? 10:20 AM 05/13/2019  ?  2:45 PM  ?Call back number  ?Post procedure Call Back phone  # [phone] cell [phone]  ?Permission to leave phone message Yes Yes Yes Yes Yes  ?  ?Post op call attempted, no answer, unable to leave WM ?

## 2021-05-31 NOTE — Telephone Encounter (Signed)
Pt states that she has a dull ache in her abdomen. She had pain yesterday that felt like "a bloating pain" but she took hyoscyamine and it improved.  Today it is better but still feels like a bloating pain.  I explained to her that it could still be pain from the air put in her colon during the colonoscopy and it should continue to improve.  Advised her to call back if symptoms worsen.   ?

## 2021-05-31 NOTE — Telephone Encounter (Signed)
Patient called stating that she is doing okay, just seems to be in a little pain. Please advise.  ?

## 2021-06-01 ENCOUNTER — Encounter: Payer: Self-pay | Admitting: Gastroenterology

## 2021-06-04 ENCOUNTER — Other Ambulatory Visit: Payer: Self-pay | Admitting: Gastroenterology

## 2021-06-05 ENCOUNTER — Other Ambulatory Visit: Payer: Self-pay | Admitting: Gastroenterology

## 2021-06-05 ENCOUNTER — Telehealth: Payer: Self-pay | Admitting: Pharmacy Technician

## 2021-06-05 ENCOUNTER — Telehealth: Payer: Self-pay

## 2021-06-05 NOTE — Telephone Encounter (Addendum)
Wynetta Fines, CPhT  [place], [employer], RN ?Also, the patient has been approved for the Stelara co-pay card. I attempted to reach the patient to inform her she needs to activate the card and give details (v/m was full) will attempt to reach patient later.  ?----- Message from Wynetta Fines, CPhT sent at 06/05/2021  9:53 AM EDT ----- ?Regarding: RE: Stelara PA ?Joshua, ?I have submitted the PA's for both the Stelara IV (medical benefit) and Stelara SQ (pharmacy benefit) ?You will only need to send the script once the Stelara SQ is approved.  Most times the insurance will inform us of which pharmacy the patient will need to use and I will f/u with you once it's approved ?Kim ?----- Message ----- ?From: Marissa Edwards, RN ?Sent: 06/05/2021   8:38 AM EDT ?To: Wynetta Fines, CPhT ?Subject: Stelara PA                                    ? ?Hi Kim, ?Dr. Loletha Carrow is placing orders for this patient to have her Stelara induction infusion at your facility. Do you all work on the PA for maintenance dose injections as well or do I need to do that separately?  ?Please let me know. ?Thank you as always! ?Marissa Couch, RN ? ? ?

## 2021-06-05 NOTE — Telephone Encounter (Signed)
Dr. Loletha Carrow has been notified. ?

## 2021-06-05 NOTE — Telephone Encounter (Signed)
Please start insurance approval for Stelara at standard induction and maintenance dosing, and I will place the order for the IV dose with the [facility]. ? ?-HD ?

## 2021-06-05 NOTE — Telephone Encounter (Signed)
Please see 06/05/21 telephone encounter for details. ?

## 2021-06-05 NOTE — Telephone Encounter (Addendum)
Dr. Loletha Carrow ?Fyi note: ? ?Auth Submission: APPROVED-  ?[employer] BENEFIT ?Payer: FRIDAY ?Medication & CPT/J Code(s) submitted: Stelara Infusion (Ustekinumab) J3358 ?Route of submission (phone, fax, portal): PHONE; [phone] ?FAX: [phone] ?Auth type: Buy/Bill ?Units/visits requested: 390 MG X1 DOSE ?Reference number:  ?Approval from:   ? ?Auth Submission: APPROVED ?STELARA SQ - PHARMACY BENEFIT ?Payer: FRIDAY ?Medication & CPT/J Code(s) submitted: Stelara SQ injection (Ustekinumab) J3357 ?Route of submission (phone, fax, portal): COVER MY MEDS ?Auth type: Pharmacy Benefit ?Units/visits requested; 90 MG Q56D ?Reference number: [id number] ?PA CASE ID- [id number] ?Approval from: 06/05/21 - 06/06/22 ? ?Script must be sent to: ?[facility] ?Phone: [phone] ?Fax: [phone] ? ?Patient will need SQ training for self administration @ MD office. ? ?Stelara co-pay card: approved ?ID#  [id number] ?GR# [id number] ?BIN# [id number] ?[id number] ?EXP: 02/17/22 ? ?Called patient to inform her of co-pay card voicemail box was full, will attempt to contact patient later. ? ? ?   ?

## 2021-06-06 ENCOUNTER — Other Ambulatory Visit: Payer: Self-pay | Admitting: Gastroenterology

## 2021-06-07 ENCOUNTER — Encounter: Payer: Self-pay | Admitting: Gastroenterology

## 2021-06-23 ENCOUNTER — Other Ambulatory Visit: Payer: Self-pay | Admitting: Gastroenterology

## 2021-06-25 ENCOUNTER — Ambulatory Visit (INDEPENDENT_AMBULATORY_CARE_PROVIDER_SITE_OTHER): Payer: 59

## 2021-06-25 VITALS — BP 125/85 | HR 71 | Temp 98.6°F | Resp 16 | Ht 61.0 in | Wt 162.0 lb

## 2021-06-25 DIAGNOSIS — K51311 Ulcerative (chronic) rectosigmoiditis with rectal bleeding: Secondary | ICD-10-CM | POA: Diagnosis not present

## 2021-06-25 MED ORDER — USTEKINUMAB 130 MG/26ML IV SOLN
390.0000 mg | Freq: Once | INTRAVENOUS | Status: AC
  Administered 2021-06-25: 390 mg via INTRAVENOUS
  Filled 2021-06-25: qty 78

## 2021-06-25 NOTE — Progress Notes (Signed)
Diagnosis: Ulcerative rectosigmoiditis with rectal bleeding ? ?Provider:  Marshell Garfinkel, MD ? ?Procedure: Infusion ? ?IV Type: Peripheral, IV Location: R Antecubital ? ?Stelera (Ustekinumab), Dose: 390 mg ? ?Infusion Start Time: 0301 ? ?Infusion Stop Time: 1502 ? ?Post Infusion IV Care: Observation period completed and Peripheral IV Discontinued ? ?Discharge: Condition: Good, Destination: Home . AVS provided to patient.  ? ?Performed by:  [name], RN  ?  ?

## 2021-06-25 NOTE — Patient Instructions (Signed)
Ustekinumab injection ?What is this medication? ?USTEKINUMAB ([place] te KIN ue mab) is used to treat plaque psoriasis and psoriatic arthritis. It is also used to treat Crohn's disease and ulcerative colitis. It is not a cure. ?This medicine may be used for other purposes; ask your health care provider or pharmacist if you have questions. ?COMMON BRAND NAME(S): Stelara ?What should I tell my care team before I take this medication? ?They need to know if you have any of these conditions: ?cancer ?diabetes ?history of skin cancer ?immune system problems ?infection (especially a virus infection such as chickenpox, cold sores, or herpes) or history of infections ?new or changing lesions on your skin ?receiving or have received allergy shots ?receive or have received phototherapy for the skin ?recently received or scheduled to receive a vaccine ?tuberculosis, a positive skin test for tuberculosis, or have recently been in close contact with someone who has tuberculosis ?an unusual reaction to ustekinumab, latex, other medicines, foods, dyes, or preservatives ?pregnant or trying to get pregnant ?breast-feeding ?How should I use this medication? ?This medicine is for injection under the skin or infusion into a vein. It is usually given by a health care professional in a hospital or clinic setting. If you get this medicine at home, you will be taught how to prepare and give this medicine. Use exactly as directed. Take your medicine at regular intervals. Do not take your medicine more often than directed. ?It is important that you put your used needles and syringes in a special sharps container. Do not put them in a trash can. If you do not have a sharps container, call your pharmacist or healthcare provider to get one. ?A special MedGuide will be given to you by the pharmacist with each prescription and refill. Be sure to read this information carefully each time. ?Talk to your pediatrician regarding the use of this medicine in  children. While this drug may be prescribed for children as young as 6 years for selected conditions, precautions do apply. ?Overdosage: If you think you have taken too much of this medicine contact a poison control center or emergency room at once. ?NOTE: This medicine is only for you. Do not share this medicine with others. ?What if I miss a dose? ?If you miss a dose, take it as soon as you can. If it is almost time for your next dose, take only that dose. Do not take double or extra doses. ?What may interact with this medication? ?Do not take this medicine with any of the following medications: ?live virus vaccines ?This medicine may also interact with the following medications: ?cyclosporine ?biologic medicines such as abatacept, adalimumab, anakinra, certolizumab, etanercept, golimumab, infliximab, rituximab, secukinumab, tocilizumab ?warfarin ?This list may not describe all possible interactions. Give your health care provider a list of all the medicines, herbs, non-prescription drugs, or dietary supplements you use. Also tell them if you smoke, drink alcohol, or use illegal drugs. Some items may interact with your medicine. ?What should I watch for while using this medication? ?Your condition will be monitored carefully while you are receiving this medication. Tell your care team if your symptoms do not start to get better or if they get worse. ?You will be tested for tuberculosis (TB) before you start this medication. If your care team prescribes any medication for TB, you should start taking the TB medication before starting this medication. Make sure to finish the full course of TB medication. ?This medication may increase your risk of getting an infection.  Call your care team for advice if you get fever, chills, sore throat, or other symptoms of a cold or flu. Do not treat yourself. Try to avoid being around people who are sick. ?Talk to your care team about your risk of cancer. You may be more at risk  for certain types of cancers if you take this medication. ?Certain genetic factors may decrease the safety of this medication. Your care team may use genetic tests to determine treatment. ?What side effects may I notice from receiving this medication? ?Side effects that you should report to your doctor or health care professional as soon as possible: ?allergic reactions like skin rash, itching or hives, swelling of the face, lips, or tongue ?breathing problems ?changes in vision ?confusion ?headache ?persistent headache ?seizures ?signs and symptoms of infection like fever or chills; cough; sore throat; pain or trouble passing urine ?swollen lymph nodes in the neck, underarm, or groin areas ?unexplained weight loss ?unusually weak or tired ?Side effects that usually do not require medical attention (report to your doctor or health care professional if they continue or are bothersome): ?diarrhea ?nausea, vomiting ?redness, itching, swelling, or bruising at site where injected ?stomach pain ?tiredness ?This list may not describe all possible side effects. Call your doctor for medical advice about side effects. You may report side effects to FDA at 1-800-FDA-1088. ?Where should I keep my medication? ?Keep out of the reach of children. ?Store the prefilled syringes or vials in a refrigerator between 2 to 8 degrees C (36 to 46 degrees F). Keep in the original carton. Protect from light. Do not freeze. Do not shake. The vials should be stored upright. Throw away any unused medicine that has been stored in the refrigerator after the expiration date. ?If needed, a prefilled syringe may be stored at room temperature up to 30 degrees C (86 degrees F) for a maximum of 30 days. Keep it in the original carton to protect from light. Record the date when it is removed from the refrigerator on the carton. Once a syringe has been stored at room temperature, do not put it back in the refrigerator. Throw the syringe away after 30 days  at room temperature, even if it still has medicine in it. ?NOTE: This sheet is a summary. It may not cover all possible information. If you have questions about this medicine, talk to your doctor, pharmacist, or health care provider. ?? 2023 Elsevier/Gold Standard (2021-01-05 00:00:00) ? ?

## 2021-07-26 ENCOUNTER — Encounter: Payer: Self-pay | Admitting: Gastroenterology

## 2021-07-26 MED ORDER — USTEKINUMAB 90 MG/ML ~~LOC~~ SOSY: 90.0000 mg | PREFILLED_SYRINGE | SUBCUTANEOUS | 5 refills | Status: DC

## 2021-08-06 ENCOUNTER — Other Ambulatory Visit: Payer: Self-pay | Admitting: Pharmacy Technician

## 2021-08-21 ENCOUNTER — Other Ambulatory Visit: Payer: Self-pay | Admitting: Gastroenterology

## 2021-08-22 ENCOUNTER — Encounter: Payer: Self-pay | Admitting: Gastroenterology

## 2021-08-22 ENCOUNTER — Ambulatory Visit: Payer: 59 | Admitting: Gastroenterology

## 2021-08-22 VITALS — BP 130/94 | HR 98 | Ht 61.0 in | Wt 160.2 lb

## 2021-08-22 DIAGNOSIS — K5909 Other constipation: Secondary | ICD-10-CM

## 2021-08-22 DIAGNOSIS — K50811 Crohn's disease of both small and large intestine with rectal bleeding: Secondary | ICD-10-CM | POA: Diagnosis not present

## 2021-08-22 DIAGNOSIS — Z7960 Long term (current) use of unspecified immunomodulators and immunosuppressants: Secondary | ICD-10-CM

## 2021-08-22 MED ORDER — OXYCODONE-ACETAMINOPHEN 5-325 MG PO TABS: 1.0000 | ORAL_TABLET | Freq: Four times a day (QID) | ORAL | 0 refills | Status: DC | PRN

## 2021-08-22 NOTE — Patient Instructions (Signed)
If you are age 27 or older, your body mass index should be between 23-30. Your Body mass index is 30.28 kg/m. If this is out of the aforementioned range listed, please consider follow up with your Primary Care Provider.  If you are age 77 or younger, your body mass index should be between 19-25. Your Body mass index is 30.28 kg/m. If this is out of the aformentioned range listed, please consider follow up with your Primary Care Provider.   ________________________________________________________  The [facility] providers would like to encourage you to use [facility] to communicate with providers for non-urgent requests or questions.  Due to long hold times on the telephone, sending your provider a message by [facility] may be a faster and more efficient way to get a response.  Please allow 48 business hours for a response.  Please remember that this is for non-urgent requests.  _______________________________________________________  Follow up in 8 weeks.  It was a pleasure to see you today!  Thank you for trusting me with your gastrointestinal care!

## 2021-08-22 NOTE — Progress Notes (Signed)
[facility] Progress Note  Chief Complaint: Ileocolonic Crohn's disease  Subjective  History: Marissa Morales follows up for her IBD which is more recently favoring a Crohn's phenotype.  It had primarily been patchy proctitis mainly at the rectosigmoid junction unresponsive to [facility] or Humira, and on last colonoscopy 3 months ago there was new mild ileitis as well as persistent proctitis.  She was started on Stelara and had an office consultation with Dr. [name] of [facility].  His recommendation was that if this current regimen was not helping after about 3 months, Marissa Morales should be changed to infliximab and azathioprine favored over upadacitinib (Rinvoq).  He also recommended a shingles vaccine.  Marissa Morales's induction infusion of Stelara was 8 weeks ago and first maintenance dose 2 days ago. So far, she feels about the same with intermittent rectal pain and bleeding with chronic constipation she treats with as needed Amitiza (8 mg dose usually works, she has considered trying higher dose). She has occasionally needed doses of oxycodone that was prescribed last year, she has several tablets left and requested a refill in case she needs it. ROS: Cardiovascular:  no chest pain Respiratory: no dyspnea No injection reactions The patient's Past Medical, Family and Social History were reviewed and are on file in the EMR.  Objective:  Med list reviewed  Current Outpatient Medications:    albuterol (VENTOLIN HFA) 108 (90 Base) MCG/ACT inhaler, Inhale into the lungs., Disp: , Rfl:    amitriptyline (ELAVIL) 10 MG tablet, TAKE 1 TABLET BY MOUTH EVERY DAY AT BEDTIME, Disp: 90 tablet, Rfl: 1   cetirizine (ZYRTEC) 10 MG tablet, Take 10 mg by mouth daily., Disp: , Rfl:    clindamycin (CLEOCIN T) 1 % SWAB, Apply 1 Application topically 2 (two) times daily., Disp: , Rfl:    clobetasol cream (TEMOVATE) 6.07 %, Apply 1 Application topically 2 (two) times daily., Disp: , Rfl:    diclofenac Sodium (VOLTAREN) 1  % GEL, Apply 2-3 g topically 4 (four) times daily as needed., Disp: , Rfl:    dicyclomine (BENTYL) 10 MG capsule, TAKE 1 CAPSULE 2-3 TIMES A DAY AS NEEDED FOR ABDOMINAL CRAMPS, Disp: 60 capsule, Rfl: 1   fluticasone-salmeterol (ADVAIR DISKUS) 250-50 MCG/ACT AEPB, Inhale 1 puff into the lungs in the morning and at bedtime., Disp: 3 each, Rfl: 1   hydrocortisone cream 1 %, Apply to affected area 2 times daily, Disp: 15 g, Rfl: 0   lansoprazole (PREVACID) 30 MG capsule, TAKE 1 CAPSULE (30 MG TOTAL) BY MOUTH DAILY AT 12 NOON., Disp: 30 capsule, Rfl: 3   linaclotide (LINZESS) 72 MCG capsule, Take 1 capsule (72 mcg total) by mouth daily before breakfast., Disp: 90 capsule, Rfl: 1   lubiprostone (AMITIZA) 8 MCG capsule, Take 1 capsule (8 mcg total) by mouth 2 (two) times daily with a meal., Disp: 60 capsule, Rfl: 3   mesalamine (ROWASA) 4 g enema, PLACE 60 MLS RECTALLY AT BEDTIME. RETAIN OVERNIGHT IF POSSIBLE, Disp: 420 mL, Rfl: 1   montelukast (SINGULAIR) 10 MG tablet, Take 10 mg by mouth daily., Disp: , Rfl:    Multiple Vitamin (MULTIVITAMIN) capsule, Take 1 capsule by mouth daily. Immune supplement, Disp: , Rfl:    mupirocin ointment (BACTROBAN) 2 %, Apply 1 Application topically 2 (two) times daily., Disp: , Rfl:    ondansetron (ZOFRAN) 4 MG tablet, Take 1 tablet (4 mg total) by mouth every 6 (six) hours., Disp: 12 tablet, Rfl: 0   oxyCODONE-acetaminophen (PERCOCET) 5-325 MG tablet,  Take 1-2 tablets by mouth every 6 (six) hours as needed for severe pain., Disp: 20 tablet, Rfl: 0   sertraline (ZOLOFT) 50 MG tablet, Take 1 tablet by mouth daily., Disp: , Rfl:    triamcinolone cream (KENALOG) 0.5 %, Apply 1 application topically 3 (three) times daily., Disp: , Rfl:    ustekinumab (STELARA) 90 MG/ML SOSY injection, Inject 1 mL (90 mg total) into the skin every 8 (eight) weeks., Disp: 1 mL, Rfl: 5   Zinc Sulfate (ZINC 15 PO), Take by mouth every morning., Disp: , Rfl:    Vital signs in last 24  hrs: Vitals:   08/22/21 1336  BP: (!) 130/94  Pulse: 98   Wt Readings from Last 3 Encounters:  08/22/21 160 lb 4 oz (72.7 kg)  06/25/21 162 lb (73.5 kg)  05/29/21 162 lb (73.5 kg)    Physical Exam  Well-appearing HEENT: sclera anicteric, oral mucosa moist without lesions Cardiac: Regular without murmur,  no peripheral edema Pulm: clear to auscultation bilaterally, normal RR and effort noted Abdomen: soft, no tenderness, with active bowel sounds. No guarding or palpable hepatosplenomegaly. Skin; warm and dry, no jaundice or rash  Labs:   ___________________________________________ Radiologic studies:   ____________________________________________ Other: Findings from 05/29/2021 colonoscopy  The perianal and digital rectal examinations were normal. - Patchy inflammation, mild in severity and characterized by congestion (edema), erythema, granularity, loss of vascularity and mucus was found in the terminal ileum. Biopsies were taken with a cold forceps for histology. This is a new finding compared with prior exams. - Inflammation was found in the proximal to mid rectum. Begins at rectosigmoid junction (about 20 from anal verge) and ends between mid and distal rectal valve. This was graded as Mayo Score 2-3, and when compared to the last examination, the findings are worsened. Similar appearance to initial colonoscopy findings in 04/2019. Biopsies were taken with a cold forceps for histology. - The exam was otherwise without abnormality.  _________________  Pathology  1. Surgical [P], small bowel, terminal ileum ACTIVE ILEITIS WITH CRYPT DISTORTION AND FEATURES MOST CONSISTENT WITH CROHN'S DISEASE IN ACTIVE PHASE. NEGATIVE FOR DYSPLASIA AND MALIGNANCY. 2. Surgical [P], colon, rectum CHRONIC ACTIVE COLITIS WITH CRYPT DISTORTION AND A FEW CRYPT ABSCESSES WITH FEATURES MOST CONSISTENT WITH CROHN'S DISEASE IN MODERATELY ACTIVE PHASE. NEGATIVE FOR DYSPLASIA OR  MALIGNANCY.  _____________________________________________ Assessment & Plan  Assessment: Encounter Diagnoses  Name Primary?   Crohn's disease of both small and large intestine with rectal bleeding ([place]) Yes   Long-term use of immunosuppressant medication    Chronic constipation    She has not yet much improved on Stelara after 2 months, hopefully with recent injection and some more time things will improve.   Plan: Shingles vaccine -she was advised to get this either primary care or at a commercial pharmacy.  She may need to bring her portal access to show my office note to indicate why she needs this vaccine at her age.  As needed use of Amitiza, can try 2 tablets with the next dose if needed.  Small supply oxycodone 5 mg prescribed.  Office visit 8 weeks, call sooner as needed.  [name]

## 2021-09-14 ENCOUNTER — Other Ambulatory Visit: Payer: Self-pay | Admitting: Internal Medicine

## 2021-09-24 ENCOUNTER — Encounter: Payer: Self-pay | Admitting: Gastroenterology

## 2021-09-24 NOTE — Telephone Encounter (Signed)
Please initiate PA with patient's new insurance for Stelara 90 mg/ml under the skin every 8 weeks Thanks

## 2021-09-25 ENCOUNTER — Telehealth: Payer: Self-pay | Admitting: Pharmacy Technician

## 2021-09-25 NOTE — Telephone Encounter (Signed)
PA has been submitted and telephone encounter created.

## 2021-09-25 NOTE — Telephone Encounter (Addendum)
Patient Advocate Encounter  Received notification from [facility] that prior authorization for [facility] is required.   PA submitted on 8.8.23 Key [id number] Stat [place] is pending    Luciano Cutter, CPhT Patient Advocate Phone: [phone]

## 2021-09-28 ENCOUNTER — Encounter: Payer: Self-pay | Admitting: Gastroenterology

## 2021-10-02 ENCOUNTER — Other Ambulatory Visit (HOSPITAL_COMMUNITY): Payer: Self-pay

## 2021-10-02 NOTE — Telephone Encounter (Signed)
Called [facility] to check status on Stelara PA, rep advised PA is currently under their Medical Director Review since medication is non-preferred. Rep could not provide an timeframe for determination.  If approved, patient will likely need to switch Specialty Pharmacies to [facility] since she now has [employer].  [facility] Phone# [phone]  Team will continue to check status.

## 2021-10-02 NOTE — Telephone Encounter (Signed)
Patient sent Mychart message requesting update on status. Next injection due 8/28. Thanks!

## 2021-10-04 NOTE — Telephone Encounter (Signed)
Called and spoke with patient regarding a Stelara PAP. Pt informed me that she has set up her Stelara and me account. Patient assistance application not needed from [place] at this time. Will continue to follow up with PA status.

## 2021-10-10 ENCOUNTER — Other Ambulatory Visit (HOSPITAL_COMMUNITY): Payer: Self-pay

## 2021-10-10 NOTE — Telephone Encounter (Signed)
Called and spoke with pt insurance claims department for further assistance. Rep explained that this is past there standard 8 days for regular and 3 days for expedited protocol. This has now been escalated under medical director review and generally will receive a response with 1 to 2 business HOURS via phone. Will updated when a return call has been received

## 2021-10-11 ENCOUNTER — Encounter: Payer: Self-pay | Admitting: Gastroenterology

## 2021-10-11 NOTE — Telephone Encounter (Signed)
Monchell,  I did not see where Stelara had been denied, but I received a letter from Dr. Loletha Carrow today. Are you able to submit his letter for appeal? Thanks

## 2021-10-11 NOTE — Telephone Encounter (Signed)
Received a fax regarding Prior Authorization from [employer] for [facility]. Authorization has been DENIED. Denial letter has been uploaded to media.   Appeal letter along with supporting documents have been submitted

## 2021-10-11 NOTE — Telephone Encounter (Signed)
It is extremely disappointing that her new insurance company is behaving this way.  I have written a detailed appeal letter that you will find in the Communications or Letters section of her chart. Please fax it to her insurance company today and let Esly know that.  - HD

## 2021-10-15 ENCOUNTER — Encounter: Payer: Self-pay | Admitting: Gastroenterology

## 2021-10-15 ENCOUNTER — Ambulatory Visit: Payer: Commercial Managed Care - HMO | Admitting: Gastroenterology

## 2021-10-15 VITALS — BP 110/72 | HR 94 | Ht 61.0 in | Wt 158.1 lb

## 2021-10-15 DIAGNOSIS — K5909 Other constipation: Secondary | ICD-10-CM

## 2021-10-15 DIAGNOSIS — R110 Nausea: Secondary | ICD-10-CM

## 2021-10-15 DIAGNOSIS — Z7960 Long term (current) use of unspecified immunomodulators and immunosuppressants: Secondary | ICD-10-CM

## 2021-10-15 DIAGNOSIS — R1030 Lower abdominal pain, unspecified: Secondary | ICD-10-CM

## 2021-10-15 DIAGNOSIS — K50811 Crohn's disease of both small and large intestine with rectal bleeding: Secondary | ICD-10-CM | POA: Diagnosis not present

## 2021-10-15 MED ORDER — OMEPRAZOLE 40 MG PO CPDR: 40.0000 mg | DELAYED_RELEASE_CAPSULE | Freq: Every day | ORAL | 1 refills | Status: DC

## 2021-10-15 MED ORDER — ONDANSETRON HCL 4 MG PO TABS: 4.0000 mg | ORAL_TABLET | Freq: Three times a day (TID) | ORAL | 1 refills | Status: DC | PRN

## 2021-10-15 NOTE — Patient Instructions (Signed)
_______________________________________________________  If you are age 27 or older, your body mass index should be between 23-30. Your Body mass index is 29.88 kg/m. If this is out of the aforementioned range listed, please consider follow up with your Primary Care Provider.  If you are age 71 or younger, your body mass index should be between 19-25. Your Body mass index is 29.88 kg/m. If this is out of the aformentioned range listed, please consider follow up with your Primary Care Provider.   ________________________________________________________  The [facility] providers would like to encourage you to use [facility] to communicate with providers for non-urgent requests or questions.  Due to long hold times on the telephone, sending your provider a message by [facility] may be a faster and more efficient way to get a response.  Please allow 48 business hours for a response.  Please remember that this is for non-urgent requests.  _______________________________________________________  We have refilled your medication, Zofran and sent omeprazole as requested.  It was a pleasure to see you today!  Thank you for trusting me with your gastrointestinal care!

## 2021-10-15 NOTE — Progress Notes (Addendum)
[facility] Progress Note  Chief Complaint: Crohn's disease involving terminal ileum and rectum  Subjective  History: Marissa Morales was here for follow-up of her inflammatory bowel disease.  As outlined in many prior notes, her clinical picture was initially an isolated proctitis of the rectosigmoid region refractory to multiple medicines.  This led to my concerns this may be more of a Crohn's phenotype, and then on most recent colonoscopy there was persistent activity in that region as well as new ileitis.  She was then started on Stelara standard dosing and seen in consultation by Dr. [name] of the [facility] department.  Excerpt from his 06/27/2021 consult note: "ASSESSMENT: 1. Inflammatory bowel disease. Most likely due to ileocolonic Crohn's disease given recent ileitis. Patient denies NSAID use. Currently mildly symptomatic. Only recently started ustekinumab. Recommend continuing ustekinumab induction and maintenance therapy for a total of 3 doses. Thereafter, if still subjective and objective signs of active Crohn's disease, then consider changing ustekinumab to infliximab combined with azathioprine. I reviewed the risks of these medicines including risk of infection, malignancy, hepatitis, pancreatitis, cytopenias, need for regular blood work monitoring. These medicines would require intensive monitoring for toxicity. Alternatively, could consider upadacitinib therapy though I would favor infliximab combined with azathioprine. 2. IBS-C. Continue Amitiza as it seems to be helping her abdominal pain. 3. Increased risk for colorectal cancer. Mother diagnosed with cancer in her 74s. 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 Given the relatively limited extent of colon inflammation thus far, I do not think that her proctitis significantly adds to the risk of colon cancer. Recommend repeat colonoscopy for surveillance purposes no later than 5 years from now. 4. Medically  immune suppressed, in need of vaccination.  RECOMMENDATIONS AND PLAN: Patient Instructions  -OK to continue Rowasa enemas if they provide relief of symptoms. Talk with Dr. Loletha Carrow about getting a month supply to save money. -Continue ustekinumab induction and maintenance therapy. -After 3rd dose, reassess response. If still with objective and subjective signs of Crohn's, then consider changing to infliximab combined with azathioprine. -In the future, could consider upadacitinib if failure of the above therapies. -Consider obtaining Shingrix shingles vaccine series -Seasonal flu shot each October -Clinic with Dr. Loletha Carrow as scheduled and me as needed." ______________________ [name] also continues Amitiza for functional constipation.  Marissa Morales contacted our office in the last 1 to 2 weeks with difficulty obtaining her Stelara because it was denied by her new insurance provider.  This treatment apparently did not fit there IBD sequential treatment algorithm, requiring contraindication or lack of response to at least 2 of the following: Humira, Cimzia,Skyrizi. I wrote an appeal letter requesting approval for the Stelara since she is due for her next dose, no response yet.  From the standpoint of her Crohn's, Marissa Morales is feeling the best she has in years.  Her rectal/pelvic pain is much improved, last bleeding was weeks ago, she still tends to have constipation.  Her nausea was increased in the last week or 2, coinciding with her menstrual cycle and some increases in stress.  She also feels that Prevacid does not work as well as omeprazole to control her heartburn.  It was a necessary insurance change, which she would like to go back to omeprazole even if self-pay.  She also needs a refill on ondansetron.  ROS: Cardiovascular:  no chest pain Respiratory: no dyspnea Since starting Stelara she will periodically have some painful raised bumps on her arms or hands that  will last a day or 2, improving with cortisone  cream.  They do not occur at injection sites. Remainder systems negative except as above The patient's Past Medical, Family and Social History were reviewed and are on file in the EMR.  Objective:  Med list reviewed  Current Outpatient Medications:    albuterol (VENTOLIN HFA) 108 (90 Base) MCG/ACT inhaler, Inhale into the lungs., Disp: , Rfl:    amitriptyline (ELAVIL) 10 MG tablet, TAKE 1 TABLET BY MOUTH EVERY DAY AT BEDTIME, Disp: 90 tablet, Rfl: 1   cetirizine (ZYRTEC) 10 MG tablet, Take 10 mg by mouth daily., Disp: , Rfl:    clindamycin (CLEOCIN T) 1 % SWAB, Apply 1 Application topically 2 (two) times daily., Disp: , Rfl:    clobetasol cream (TEMOVATE) 6.96 %, Apply 1 Application topically 2 (two) times daily., Disp: , Rfl:    diclofenac Sodium (VOLTAREN) 1 % GEL, Apply 2-3 g topically 4 (four) times daily as needed., Disp: , Rfl:    dicyclomine (BENTYL) 10 MG capsule, TAKE 1 CAPSULE 2-3 TIMES A DAY AS NEEDED FOR ABDOMINAL CRAMPS, Disp: 60 capsule, Rfl: 1   fluticasone-salmeterol (ADVAIR DISKUS) 250-50 MCG/ACT AEPB, INHALE 1 PUFF BY MOUTH INTO THE LUNGS IN THE MORNING AND AT BEDTIME, Disp: 180 each, Rfl: 1   hydrocortisone cream 1 %, Apply to affected area 2 times daily, Disp: 15 g, Rfl: 0   linaclotide (LINZESS) 72 MCG capsule, Take 1 capsule (72 mcg total) by mouth daily before breakfast., Disp: 90 capsule, Rfl: 1   lubiprostone (AMITIZA) 8 MCG capsule, Take 1 capsule (8 mcg total) by mouth 2 (two) times daily with a meal., Disp: 60 capsule, Rfl: 3   montelukast (SINGULAIR) 10 MG tablet, Take 10 mg by mouth daily., Disp: , Rfl:    Multiple Vitamin (MULTIVITAMIN) capsule, Take 1 capsule by mouth daily. Immune supplement, Disp: , Rfl:    omeprazole (PRILOSEC) 40 MG capsule, Take 1 capsule (40 mg total) by mouth daily., Disp: 90 capsule, Rfl: 1   oxyCODONE-acetaminophen (PERCOCET) 5-325 MG tablet, Take 1-2 tablets by mouth every 6 (six) hours as needed for severe pain., Disp: 20 tablet, Rfl:  0   sertraline (ZOLOFT) 50 MG tablet, Take 1 tablet by mouth daily., Disp: , Rfl:    ustekinumab (STELARA) 90 MG/ML SOSY injection, Inject 1 mL (90 mg total) into the skin every 8 (eight) weeks., Disp: 1 mL, Rfl: 5   Zinc Sulfate (ZINC 15 PO), Take by mouth every morning., Disp: , Rfl:    mesalamine (ROWASA) 4 g enema, PLACE 60 MLS RECTALLY AT BEDTIME. RETAIN OVERNIGHT IF POSSIBLE (Patient not taking: Reported on 10/15/2021), Disp: 420 mL, Rfl: 1   mupirocin ointment (BACTROBAN) 2 %, Apply 1 Application topically 2 (two) times daily. (Patient not taking: Reported on 10/15/2021), Disp: , Rfl:    ondansetron (ZOFRAN) 4 MG tablet, Take 1 tablet (4 mg total) by mouth every 8 (eight) hours as needed for nausea or vomiting., Disp: 60 tablet, Rfl: 1   triamcinolone cream (KENALOG) 0.5 %, Apply 1 application topically 3 (three) times daily. (Patient not taking: Reported on 10/15/2021), Disp: , Rfl:    Vital signs in last 24 hrs: Vitals:   10/15/21 1401  BP: 110/72  Pulse: 94   Wt Readings from Last 3 Encounters:  10/15/21 158 lb 2 oz (71.7 kg)  08/22/21 160 lb 4 oz (72.7 kg)  06/25/21 162 lb (73.5 kg)    Physical Exam  Looks well HEENT: sclera anicteric, oral  mucosa moist without lesions Neck: supple, no thyromegaly, JVD or lymphadenopathy Cardiac: Regular without murmur,  no peripheral edema Pulm: clear to auscultation bilaterally, normal RR and effort noted Abdomen: soft, mild RLQ tenderness, with active bowel sounds. No guarding or palpable hepatosplenomegaly. Skin; warm and dry, no jaundice or rash  Labs:   ___________________________________________ Radiologic studies:   ____________________________________________ Other:   _____________________________________________ Assessment & Plan  Assessment: Encounter Diagnoses  Name Primary?   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  She finally has good symptom control for the first time in years on Stelara monotherapy.  Unfortunately, her insurance has not yet approved continued use despite an appeal letter last week.  She will contact them this week, and I will ask our nursing staff to do the same.  If it is still denied, she may need to change to [facility], which I plan to discuss with Dr. Carlos Levering by email.  No lab work due at this point.  Marissa Morales got her first shingles vaccine and plans to get the next in 3 to 6 months.  She also gets her flu shot at work and will plan for COVID-vaccine when they are available in a month or 2.  Follow up 2-3 months (depending on what happens with her treatment plan)  Zofran refilled  Changed prevacid to omeprazole  30 minutes were spent on this encounter (including chart review, history/exam, counseling/coordination of care, and documentation) > 50% of that time was spent on counseling and coordination of care.   [name]  Addendum 10/23/2021  See portal messages since recent office visit -no further answer from this patient's insurance company regarding Stelara  We will begin process for Skyrizi infusions at the [facility] infusion center  - HD

## 2021-10-16 ENCOUNTER — Encounter: Payer: Self-pay | Admitting: Gastroenterology

## 2021-10-16 ENCOUNTER — Other Ambulatory Visit (HOSPITAL_COMMUNITY): Payer: Self-pay

## 2021-10-23 ENCOUNTER — Encounter: Payer: Self-pay | Admitting: Gastroenterology

## 2021-10-23 DIAGNOSIS — K50811 Crohn's disease of both small and large intestine with rectal bleeding: Secondary | ICD-10-CM | POA: Insufficient documentation

## 2021-10-23 NOTE — Telephone Encounter (Signed)
Noted, thanks!

## 2021-10-23 NOTE — Telephone Encounter (Signed)
I will place orders for Skyrizi infusions with the [address] infusion center.  - HD

## 2021-10-23 NOTE — Addendum Note (Signed)
Addended by: Nelida Meuse on: 10/23/2021 03:02 PM   Modules accepted: Orders

## 2021-10-24 NOTE — Telephone Encounter (Signed)
Dr. Loletha Carrow has changed the plan of therapy and will order [facility] for patient. Thanks

## 2021-10-24 NOTE — Telephone Encounter (Signed)
I will call the insurance to get an update and will follow up with you.

## 2021-10-26 ENCOUNTER — Other Ambulatory Visit (HOSPITAL_COMMUNITY): Payer: Self-pay

## 2021-10-26 NOTE — Telephone Encounter (Addendum)
Patient will receive Skyrizi at the [facility] center on [facility]. Infusion center will complete PA for Skyrizi infusion and maintenance dose.

## 2021-10-29 ENCOUNTER — Telehealth: Payer: Self-pay | Admitting: Pharmacy Technician

## 2021-10-29 NOTE — Telephone Encounter (Signed)
Patient Advocate Encounter  Received notification that prior authorization for [facility] is required.   PA submitted on (WAITING ON DOSE) Key ---- Status is pending    Luciano Cutter, CPhT Patient Advocate Phone: [phone]

## 2021-10-29 NOTE — Telephone Encounter (Addendum)
Auth Submission: Approved Payer: [employer] Medication & CPT/J Code(s) submitted: Skyrizi Orson Gear) [phone] Route of submission (phone, fax, portal):  Phone # [phone] Fax # Auth type: Buy/Bill Units/visits requested: SKYRIZI 600MG X3 DOSES Reference number: [id number] Approval from:  to  at [place] must be filled through [place]. Rep: Crissie Sickles [phone]  @[email or web address] , Please have script sent to [place] Phone: [phone] Fax: [phone]  @[email or web address]  please submit PA for skyrizi on-body injector.  [place] co-pay card: Approved ID: [id number] [place]: [id number] BIN: [id number] PCN: [employer] [place] Fax EOB: [phone] Phone: [phone] Funds will be loaded to card.

## 2021-10-30 ENCOUNTER — Other Ambulatory Visit (HOSPITAL_COMMUNITY): Payer: Self-pay

## 2021-10-30 ENCOUNTER — Encounter: Payer: Self-pay | Admitting: Gastroenterology

## 2021-10-30 NOTE — Telephone Encounter (Signed)
Starting 4 weeks after the 3rd IV induction dose, the maintenance dosing will be:  180 mg SQ every 8 weeks  - H. Loletha Carrow, MD

## 2021-10-30 NOTE — Telephone Encounter (Signed)
PA for maintenance has been submitted and approved.

## 2021-10-31 NOTE — Telephone Encounter (Signed)
Skyrizi prescription for IV doses completed and faxed back to [employer] at [phone].

## 2021-11-08 ENCOUNTER — Ambulatory Visit (INDEPENDENT_AMBULATORY_CARE_PROVIDER_SITE_OTHER): Payer: Commercial Managed Care - HMO

## 2021-11-08 VITALS — BP 117/81 | HR 76 | Temp 97.8°F | Resp 18 | Ht 61.0 in | Wt 160.0 lb

## 2021-11-08 DIAGNOSIS — K50811 Crohn's disease of both small and large intestine with rectal bleeding: Secondary | ICD-10-CM | POA: Diagnosis not present

## 2021-11-08 MED ORDER — RISANKIZUMAB-RZAA 600 MG/10ML IV SOLN
600.0000 mg | Freq: Once | INTRAVENOUS | Status: AC
  Administered 2021-11-08: 600 mg via INTRAVENOUS
  Filled 2021-11-08: qty 10

## 2021-11-08 NOTE — Progress Notes (Signed)
Diagnosis: Crohn's Disease  Provider:  Marshell Garfinkel MD  Procedure: Infusion  IV Type: Peripheral, IV Location: R Antecubital  Skyrizi , Dose: 64m  Infusion Start Time: 19476 Infusion Stop Time: 15465 Post Infusion IV Care: Observation period completed and Peripheral IV Discontinued  Discharge: Condition: Good, Destination: Home . AVS provided to patient.   Performed by:  LAdelina Mings LPN

## 2021-12-02 ENCOUNTER — Encounter: Payer: Self-pay | Admitting: Gastroenterology

## 2021-12-03 ENCOUNTER — Other Ambulatory Visit: Payer: Self-pay | Admitting: Gastroenterology

## 2021-12-03 NOTE — Telephone Encounter (Signed)
Please advise, as there are interaction flags that pop from the refills.

## 2021-12-06 ENCOUNTER — Other Ambulatory Visit: Payer: Self-pay | Admitting: Gastroenterology

## 2021-12-06 ENCOUNTER — Ambulatory Visit (INDEPENDENT_AMBULATORY_CARE_PROVIDER_SITE_OTHER): Payer: Commercial Managed Care - HMO

## 2021-12-06 VITALS — BP 119/79 | HR 81 | Temp 98.4°F | Resp 16 | Ht 61.0 in | Wt 160.4 lb

## 2021-12-06 DIAGNOSIS — K50811 Crohn's disease of both small and large intestine with rectal bleeding: Secondary | ICD-10-CM

## 2021-12-06 MED ORDER — RISANKIZUMAB-RZAA 600 MG/10ML IV SOLN
600.0000 mg | Freq: Once | INTRAVENOUS | Status: AC
  Administered 2021-12-06: 600 mg via INTRAVENOUS
  Filled 2021-12-06: qty 10

## 2021-12-06 NOTE — Progress Notes (Signed)
Diagnosis: Crohn's Disease  Provider:  Marshell Garfinkel MD  Procedure: Infusion  IV Type: Peripheral, IV Location: R Antecubital  Skyrizi (risankizumab-rzaa), Dose: 600 mg  Infusion Start Time: 8270  Infusion Stop Time: 1500  Post Infusion IV Care: Peripheral IV Discontinued  Discharge: Condition: Good, Destination: Home . AVS provided to patient.   Performed by:  Adelina Mings, LPN

## 2021-12-28 ENCOUNTER — Other Ambulatory Visit: Payer: Self-pay | Admitting: Gastroenterology

## 2021-12-28 NOTE — Telephone Encounter (Signed)
Patient is requesting Elavil. Please advise.

## 2021-12-31 ENCOUNTER — Encounter: Payer: Self-pay | Admitting: Internal Medicine

## 2022-01-01 ENCOUNTER — Other Ambulatory Visit: Payer: Self-pay

## 2022-01-01 DIAGNOSIS — G4733 Obstructive sleep apnea (adult) (pediatric): Secondary | ICD-10-CM

## 2022-01-01 NOTE — Telephone Encounter (Signed)
Ok to order hsat. Would also get her on the schedule with APP to review results.

## 2022-01-03 ENCOUNTER — Ambulatory Visit (INDEPENDENT_AMBULATORY_CARE_PROVIDER_SITE_OTHER): Payer: Commercial Managed Care - HMO

## 2022-01-03 VITALS — BP 125/85 | HR 84 | Temp 98.0°F | Resp 18 | Ht 60.0 in | Wt 165.8 lb

## 2022-01-03 DIAGNOSIS — K50811 Crohn's disease of both small and large intestine with rectal bleeding: Secondary | ICD-10-CM

## 2022-01-03 MED ORDER — RISANKIZUMAB-RZAA 600 MG/10ML IV SOLN
600.0000 mg | Freq: Once | INTRAVENOUS | Status: AC
  Administered 2022-01-03: 600 mg via INTRAVENOUS
  Filled 2022-01-03: qty 10

## 2022-01-03 NOTE — Progress Notes (Signed)
Diagnosis: Crohn's Disease  Provider:  Marshell Garfinkel MD  Procedure: Infusion  IV Type: Peripheral, IV Location: R Antecubital  Skyrizi (risankizumab-rzaa), Dose: 600 mg  Infusion Start Time: 0322  Infusion Stop Time: 1509  Post Infusion IV Care: Peripheral IV Discontinued  Discharge: Condition: Good, Destination: Home . AVS provided to patient.   Performed by:  Adelina Mings, LPN

## 2022-01-07 ENCOUNTER — Encounter: Payer: Self-pay | Admitting: Gastroenterology

## 2022-01-08 ENCOUNTER — Other Ambulatory Visit: Payer: Self-pay | Admitting: Gastroenterology

## 2022-01-08 ENCOUNTER — Telehealth: Payer: Self-pay | Admitting: Gastroenterology

## 2022-01-08 NOTE — Telephone Encounter (Signed)
It is probably an acute infectious illness that would likely improve with symptomatic treatment. She only needs to go to the ED if pain escalates despite medication or if she has vomiting despite antiemetics and is unable to keep down fluids.  Let me know if she needs refills on any particular medicines.  She has had C. difficile in the past, so if diarrhea does not continue to improve, please bring specimen to lab for C. difficile PCR, toxin and antigen.  Lastly, she needs clinic follow-up with me for her Crohn's disease.  Looks like my next available date is December 7.  - HD

## 2022-01-08 NOTE — Telephone Encounter (Signed)
Patient called in with complaints of constant, LLQ abdominal pain (5/10) & nausea since Sunday 01/06/22. She has not thrown up since Sunday, and has been able to keep some food/fluids down. Stools are loose, but seem to be improving. Currently taking Zofran every 8 hours PRN & has taken oxycodone once on Sunday. Patient requesting further recommendations. Last seen in [place] on 10/15/21 with Dr. Loletha Carrow.

## 2022-01-08 NOTE — Telephone Encounter (Signed)
Spoke with patient regarding MD recommendations. OV scheduled for 01/24/22 at 11:00 am with Dr. Loletha Carrow. Pt advised to call back if diarrhea persist so that stool test can be ordered.

## 2022-01-08 NOTE — Telephone Encounter (Signed)
Patient is calling states she is no longer vomiting but still feels nauseas and her pain is still the same was told to go to the ED if nothing changes but she is wondering if that still stands. Please advise

## 2022-01-09 ENCOUNTER — Telehealth: Payer: Self-pay

## 2022-01-09 MED ORDER — SKYRIZI 180 MG/1.2ML ~~LOC~~ SOCT: SUBCUTANEOUS | 6 refills | Status: DC

## 2022-01-09 NOTE — Telephone Encounter (Signed)
Skyrizi maintenance dose sent to [facility].  MyChart message sent to patient with information.

## 2022-01-09 NOTE — Telephone Encounter (Signed)
-----   Message from Yevette Edwards, RN sent at 12/03/2021  8:25 AM EDT ----- Regarding: Orson Ape script Last Skyrizi infusion Thursday, 01/03/22 Start 180 mg SQ every 8 weeks - need to send to specialty pharmacy

## 2022-01-11 ENCOUNTER — Other Ambulatory Visit: Payer: Self-pay | Admitting: Gastroenterology

## 2022-01-14 ENCOUNTER — Other Ambulatory Visit: Payer: Self-pay | Admitting: Gastroenterology

## 2022-01-20 ENCOUNTER — Other Ambulatory Visit: Payer: Self-pay | Admitting: Gastroenterology

## 2022-01-24 ENCOUNTER — Ambulatory Visit: Payer: Commercial Managed Care - HMO | Admitting: Gastroenterology

## 2022-01-24 NOTE — Progress Notes (Deleted)
[facility] progress note:  History: Marissa Morales 01/24/2022  Referring provider: Jettie Booze, NP  Reason for consult/chief complaint: Crohn's disease involving terminal ileum and rectosigmoid area.   Subjective  HPI: IBD history outlined in my extensive office note Aug 2023, assessment and plan from which includes the following: ".Porschia also continues Amitiza for functional constipation.   [name] contacted our office in the last 1 to 2 weeks with difficulty obtaining her Stelara because it was denied by her new insurance provider.  This treatment apparently did not fit there IBD sequential treatment algorithm, requiring contraindication or lack of response to at least 2 of the following: Humira, Cimzia,Skyrizi. I wrote an appeal letter requesting approval for the Stelara since she is due for her next dose, no response yet.   From the standpoint of her Crohn's, [name] is feeling the best she has in years.  Her rectal/pelvic pain is much improved, last bleeding was weeks ago, she still tends to have constipation.  Her nausea was increased in the last week or 2, coinciding with her menstrual cycle and some increases in stress.  She also feels that Prevacid does not work as well as omeprazole to control her heartburn.  It was a necessary insurance change, which she would like to go back to omeprazole even if self-pay.  She also needs a refill on ondansetron. _______  Steroids changed to [employer] for insurance reasons, and at this point has had 3 doses, with first treatment 11/08/2021.  Due for for self injection on 01/31/2022.  She contacted me a few times in the last 2 months with episodic abdominal pain and constipation. Most recent message a few weeks back with what she thought was food poisoning characterized by abdominal pain vomiting and diarrhea.  ***   ROS:  Review of Systems   Past Medical History: Past Medical History:  Diagnosis Date   Allergy     cats   Anemia    Anxiety    panic attacks   Asthma    as a child - dirty cats will cause an asthma attack   Depression    GERD (gastroesophageal reflux disease)    Hemorrhoids    History of asthma    childhood   IBS (irritable bowel syndrome)    Pilonidal cyst    Wears glasses      Past Surgical History: Past Surgical History:  Procedure Laterality Date   ANAL RECTAL MANOMETRY N/A 07/24/2020   Procedure: ANO RECTAL MANOMETRY;  Surgeon: Doran Stabler, MD;  Location: [facility];  Service: Gastroenterology;  Laterality: N/A;   COLONOSCOPY     COLONOSCOPY WITH ESOPHAGOGASTRODUODENOSCOPY (EGD)  x2  last one 2015   KNEE ARTHROSCOPY Left 03/30/2020   PILONIDAL CYST EXCISION N/A 07/18/2017   Procedure: CYST EXCISION PILONIDAL;  Surgeon: Clovis Riley, MD;  Location: [place];  Service: General;  Laterality: N/A;   UPPER GASTROINTESTINAL ENDOSCOPY     WISDOM TOOTH EXTRACTION  2015     Family History: Family History  Problem Relation Age of Onset   Asthma Mother    Irritable bowel syndrome Mother    Endometriosis Mother    Colon cancer Mother    COPD Mother    Hypertension Father    Colon polyps Father    Endometriosis Sister    Irritable bowel syndrome Sister    Diverticulitis Sister    Heart attack Maternal Grandmother    Breast cancer Paternal Grandfather  Colon cancer Maternal Uncle    Liver cancer Neg Hx    Rectal cancer Neg Hx    Pancreatic cancer Neg Hx    Stomach cancer Neg Hx    Esophageal cancer Neg Hx     Social History: Social History   Socioeconomic History   Marital status: Single    Spouse name: Not on file   Number of children: Not on file   Years of education: Not on file   Highest education level: Not on file  Occupational History   Not on file  Tobacco Use   Smoking status: Former    Packs/day: 0.50    Years: 5.00    Total pack years: 2.50    Types: Cigarettes, E-cigarettes    Quit date: 05/15/2017     Years since quitting: 4.6   Smokeless tobacco: Never   Tobacco comments:    07-15-2017  per pt quit smoking cig. 05-15-2017 but occasionally vapes  Vaping Use   Vaping Use: Some days  Substance and Sexual Activity   Alcohol use: Not Currently    Comment: rarely   Drug use: No   Sexual activity: Yes    Birth control/protection: None    Comment: Last menstral cycle approximately 2 weeks ago "Late August".  Pt denies being pregnant.  Other Topics Concern   Not on file  Social History Narrative   Not on file   Social Determinants of Health   Financial Resource Strain: Not on file  Food Insecurity: Not on file  Transportation Needs: Not on file  Physical Activity: Not on file  Stress: Not on file  Social Connections: Not on file    Allergies: Allergies  Allergen Reactions   Bactrim [Sulfamethoxazole-Trimethoprim] Nausea And Vomiting   Bismuth-Containing Compounds Nausea And Vomiting   Doxycycline Nausea And Vomiting   Vancomycin Itching    Severe pruritus of scalp, flushing   Vantin [Cefpodoxime] Other (See Comments)    "unknown childhood reaction"    Outpatient Meds: Current Outpatient Medications  Medication Sig Dispense Refill   albuterol (VENTOLIN HFA) 108 (90 Base) MCG/ACT inhaler Inhale into the lungs.     amitriptyline (ELAVIL) 10 MG tablet TAKE 1 TABLET BY MOUTH EVERYDAY AT BEDTIME 90 tablet 2   cetirizine (ZYRTEC) 10 MG tablet Take 10 mg by mouth daily.     clindamycin (CLEOCIN T) 1 % SWAB Apply 1 Application topically 2 (two) times daily.     clobetasol cream (TEMOVATE) 4.09 % Apply 1 Application topically 2 (two) times daily.     diclofenac Sodium (VOLTAREN) 1 % GEL Apply 2-3 g topically 4 (four) times daily as needed.     dicyclomine (BENTYL) 10 MG capsule TAKE 1 CAPSULE 2-3 TIMES A DAY AS NEEDED FOR ABDOMINAL CRAMPS 60 capsule 1   fluticasone-salmeterol (ADVAIR DISKUS) 250-50 MCG/ACT AEPB INHALE 1 PUFF BY MOUTH INTO THE LUNGS IN THE MORNING AND AT BEDTIME 180  each 1   hydrocortisone cream 1 % Apply to affected area 2 times daily 15 g 0   linaclotide (LINZESS) 72 MCG capsule Take 1 capsule (72 mcg total) by mouth daily before breakfast. 90 capsule 1   lubiprostone (AMITIZA) 8 MCG capsule TAKE 1 CAPSULE (8 MCG TOTAL) BY MOUTH 2 (TWO) TIMES DAILY WITH A MEAL. 180 capsule 2   mesalamine (ROWASA) 4 g enema PLACE 60 MLS RECTALLY AT BEDTIME. RETAIN OVERNIGHT IF POSSIBLE (Patient not taking: Reported on 10/15/2021) 420 mL 1   montelukast (SINGULAIR) 10 MG tablet Take 10 mg  by mouth daily.     Multiple Vitamin (MULTIVITAMIN) capsule Take 1 capsule by mouth daily. Immune supplement     mupirocin ointment (BACTROBAN) 2 % Apply 1 Application topically 2 (two) times daily. (Patient not taking: Reported on 10/15/2021)     omeprazole (PRILOSEC) 40 MG capsule Take 1 capsule (40 mg total) by mouth daily. 90 capsule 1   ondansetron (ZOFRAN) 4 MG tablet TAKE 1 TABLET BY MOUTH EVERY 8 HOURS AS NEEDED FOR NAUSEA AND VOMITING 60 tablet 1   oxyCODONE-acetaminophen (PERCOCET) 5-325 MG tablet Take 1-2 tablets by mouth every 6 (six) hours as needed for severe pain. 20 tablet 0   Risankizumab-rzaa (SKYRIZI) 180 MG/1.2ML SOCT Inject (1.2 ml) under the skin on week 12 (01/31/22), then every 8 weeks thereafter 1.2 mL 6   sertraline (ZOLOFT) 50 MG tablet Take 1 tablet by mouth daily.     triamcinolone cream (KENALOG) 0.5 % Apply 1 application topically 3 (three) times daily. (Patient not taking: Reported on 10/15/2021)     Zinc Sulfate (ZINC 15 PO) Take by mouth every morning.     No current facility-administered medications for this visit.      ___________________________________________________________________ Objective   Exam:  There were no vitals taken for this visit. Wt Readings from Last 3 Encounters:  01/03/22 165 lb 12.8 oz (75.2 kg)  12/06/21 160 lb 6.4 oz (72.8 kg)  11/08/21 160 lb (72.6 kg)    General: ***  Eyes: sclera anicteric, no redness ENT: oral mucosa  moist without lesions, no cervical or supraclavicular lymphadenopathy CV: ***, no JVD, no peripheral edema Resp: clear to auscultation bilaterally, normal RR and effort noted GI: soft, *** tenderness, with active bowel sounds. No guarding or palpable organomegaly noted. Skin; warm and dry, no rash or jaundice noted Neuro: awake, alert and oriented x 3. Normal gross motor function and fluent speech  Labs:  ***  Radiologic Studies:  ***  Assessment: No diagnosis found.  ***  Plan:  ***  Thank you for the courtesy of this consult.  Please call me with any questions or concerns.  [name]  CC: Referring provider noted above

## 2022-01-25 NOTE — Telephone Encounter (Signed)
I could see her as an add-on 4pm on Dec 14th if she can make it.  - HD

## 2022-01-31 ENCOUNTER — Ambulatory Visit (INDEPENDENT_AMBULATORY_CARE_PROVIDER_SITE_OTHER): Payer: Commercial Managed Care - HMO | Admitting: Gastroenterology

## 2022-01-31 ENCOUNTER — Encounter: Payer: Self-pay | Admitting: Gastroenterology

## 2022-01-31 ENCOUNTER — Other Ambulatory Visit (INDEPENDENT_AMBULATORY_CARE_PROVIDER_SITE_OTHER): Payer: Commercial Managed Care - HMO

## 2022-01-31 VITALS — BP 122/86 | HR 105 | Ht 60.0 in | Wt 163.0 lb

## 2022-01-31 DIAGNOSIS — K50811 Crohn's disease of both small and large intestine with rectal bleeding: Secondary | ICD-10-CM

## 2022-01-31 DIAGNOSIS — Z7960 Long term (current) use of unspecified immunomodulators and immunosuppressants: Secondary | ICD-10-CM

## 2022-01-31 LAB — CBC WITH DIFFERENTIAL/PLATELET
Basophils Absolute: 0.1 10*3/uL (ref 0.0–0.1)
Basophils Relative: 0.7 % (ref 0.0–3.0)
Eosinophils Absolute: 0.2 10*3/uL (ref 0.0–0.7)
Eosinophils Relative: 2.4 % (ref 0.0–5.0)
HCT: 37.9 % (ref 36.0–46.0)
Hemoglobin: 12.4 g/dL (ref 12.0–15.0)
Lymphocytes Relative: 36.2 % (ref 12.0–46.0)
Lymphs Abs: 3.3 10*3/uL (ref 0.7–4.0)
MCHC: 32.7 g/dL (ref 30.0–36.0)
MCV: 72.4 fl — ABNORMAL LOW (ref 78.0–100.0)
Monocytes Absolute: 0.6 10*3/uL (ref 0.1–1.0)
Monocytes Relative: 6.8 % (ref 3.0–12.0)
Neutro Abs: 4.9 10*3/uL (ref 1.4–7.7)
Neutrophils Relative %: 53.9 % (ref 43.0–77.0)
Platelets: 486 10*3/uL — ABNORMAL HIGH (ref 150.0–400.0)
RBC: 5.23 Mil/uL — ABNORMAL HIGH (ref 3.87–5.11)
RDW: 18.1 % — ABNORMAL HIGH (ref 11.5–15.5)
WBC: 9 10*3/uL (ref 4.0–10.5)

## 2022-01-31 LAB — SEDIMENTATION RATE: Sed Rate: 44 mm/hr — ABNORMAL HIGH (ref 0–20)

## 2022-01-31 NOTE — Patient Instructions (Signed)
_______________________________________________________  If you are age 27 or older, your body mass index should be between 23-30. Your Body mass index is 31.83 kg/m. If this is out of the aforementioned range listed, please consider follow up with your Primary Care Provider.  If you are age 55 or younger, your body mass index should be between 19-25. Your Body mass index is 31.83 kg/m. If this is out of the aformentioned range listed, please consider follow up with your Primary Care Provider.   ________________________________________________________  The [facility] providers would like to encourage you to use [facility] to communicate with providers for non-urgent requests or questions.  Due to long hold times on the telephone, sending your provider a message by [facility] may be a faster and more efficient way to get a response.  Please allow 48 business hours for a response.  Please remember that this is for non-urgent requests.  _______________________________________________________  Your provider has requested that you go to the basement level for lab work before leaving today. Press "B" on the elevator. The lab is located at the first door on the left as you exit the elevator.   Due to recent changes in healthcare laws, you may see the results of your imaging and laboratory studies on MyChart before your provider has had a chance to review them.  We understand that in some cases there may be results that are confusing or concerning to you. Not all laboratory results come back in the same time frame and the provider may be waiting for multiple results in order to interpret others.  Please give [place] 48 hours in order for your provider to thoroughly review all the results before contacting the office for clarification of your results.    It was a pleasure to see you today!  Thank you for trusting me with your gastrointestinal care!

## 2022-01-31 NOTE — Progress Notes (Addendum)
Marissa Morales Progress Note:  History: Marissa Morales 01/31/2022  Referring provider: April Manson, NP  Reason for consult/chief complaint: Crohn's Disease (Pt state the pain is better, the mucus came back a week ago but states she is doing better)   Subjective  HPI:  IBD history outlined in my extensive office note Aug 2023, assessment and plan from which includes the following: ".Marissa Morales also continues Amitiza for functional constipation.   Marissa Morales contacted our office in the last 1 to 2 weeks with difficulty obtaining her Stelara because it was denied by her new insurance provider.  This treatment apparently did not fit there IBD sequential treatment algorithm, requiring contraindication or lack of response to at least 2 of the following: Humira, Cimzia,Skyrizi. I wrote an appeal letter requesting approval for the Stelara since she is due for her next dose, no response yet.   From the standpoint of her Crohn's, Marissa Morales is feeling the best she has in years.  Her rectal/pelvic pain is much improved, last bleeding was weeks ago, she still tends to have constipation.  Her nausea was increased in the last week or 2, coinciding with her menstrual cycle and some increases in stress.  She also feels that Prevacid does not work as well as omeprazole to control her heartburn.  It was a necessary insurance change, which she would like to go back to omeprazole even if self-pay.  She also needs a refill on ondansetron. _______  Marissa Morales changed to Marissa Morales for insurance reasons, and at this point has had 3 doses, with first treatment 11/08/2021.  Due for for self injection on 01/31/2022.  She contacted me a few times in the last 2 months with episodic abdominal pain and constipation. Most recent message a few weeks back with what she thought was food poisoning characterized by abdominal pain vomiting and diarrhea.  (No-show for office visit on 01/24/2022 -overslept because she had tried  some new sleeping medicine)  _____________________  Marissa Morales is glad to report that she is currently feeling much better.  Couple weeks back she thinks she probably had some infectious gastroenteritis from something she ate at work.  There is still been some intermittent passage of rectal mucus for the last week, but no bleeding since after the first Skyrizi infusion.  She had headache after the second infusion, but by the third treatment did not have any apparent side effects.  First subcutaneous injection scheduled for tomorrow.  She still takes the Amitiza at times for constipation.  Her appetite is good thinks her weight is stable.  She also happily reports yesterday they report card on her first semester of college.  She is working toward an Charity fundraiser.  Last colonoscopy Marissa 2023 ROS:  Review of Systems  Constitutional:  Negative for appetite change and unexpected weight change.  HENT:  Negative for mouth sores and voice change.   Eyes:  Negative for pain and redness.  Respiratory:  Negative for cough and shortness of breath.   Cardiovascular:  Negative for chest pain and palpitations.  Genitourinary:  Negative for dysuria and hematuria.  Musculoskeletal:  Negative for arthralgias and myalgias.  Skin:  Negative for pallor and rash.  Neurological:  Negative for weakness and headaches.  Hematological:  Negative for adenopathy.  Psychiatric/Behavioral:         Anxiety -some recent trouble with that since her Zoloft dose was doubled and did not agree with her.     Past Medical History: Past Medical History:  Diagnosis Date  Allergy    cats   Anemia    Anxiety    panic attacks   Asthma    as a child - dirty cats will cause an asthma attack   Depression    GERD (gastroesophageal reflux disease)    Hemorrhoids    History of asthma    childhood   IBS (irritable bowel syndrome)    Pilonidal cyst    Wears glasses    From [facility] consultation note recommendations "Inflammatory bowel  disease. Most likely due to ileocolonic Crohn's disease given recent ileitis. Patient denies NSAID use. Currently mildly symptomatic. Only recently started ustekinumab. Recommend continuing ustekinumab induction and maintenance therapy for a total of 3 doses. Thereafter, if still subjective and objective signs of active Crohn's disease, then consider changing ustekinumab to infliximab combined with azathioprine. I reviewed the risks of these medicines including risk of infection, malignancy, hepatitis, pancreatitis, cytopenias, need for regular blood work monitoring. These medicines would require intensive monitoring for toxicity. Alternatively, could consider upadacitinib therapy though I would favor infliximab combined with azathioprine "  Past Surgical History: Past Surgical History:  Procedure Laterality Date   ANAL RECTAL MANOMETRY N/A 07/24/2020   Procedure: ANO RECTAL MANOMETRY;  Surgeon: Marissa Stabler, MD;  Location: [facility];  Service: Morales;  Laterality: N/A;   COLONOSCOPY     COLONOSCOPY WITH ESOPHAGOGASTRODUODENOSCOPY (EGD)  x2  last one 2015   KNEE ARTHROSCOPY Left 03/30/2020   PILONIDAL CYST EXCISION N/A 07/18/2017   Procedure: CYST EXCISION PILONIDAL;  Surgeon: Marissa Riley, MD;  Location: [facility];  Service: General;  Laterality: N/A;   UPPER GASTROINTESTINAL ENDOSCOPY     WISDOM TOOTH EXTRACTION  2015     Family History: Family History  Problem Relation Age of Onset   Asthma Mother    Irritable bowel syndrome Mother    Endometriosis Mother    Colon cancer Mother    COPD Mother    Hypertension Father    Colon polyps Father    Endometriosis Sister    Irritable bowel syndrome Sister    Diverticulitis Sister    Heart attack Maternal Grandmother    Breast cancer Paternal Grandfather    Colon cancer Maternal Uncle    Liver cancer Neg Hx    Rectal cancer Neg Hx    Pancreatic cancer Neg Hx    Stomach cancer Neg Hx    Esophageal  cancer Neg Hx     Social History: Social History   Socioeconomic History   Marital status: Single    Spouse name: Not on file   Number of children: Not on file   Years of education: Not on file   Highest education level: Not on file  Occupational History   Not on file  Tobacco Use   Smoking status: Former    Packs/day: 0.50    Years: 5.00    Total pack years: 2.50    Types: Cigarettes, E-cigarettes    Quit date: 05/15/2017    Years since quitting: 4.7   Smokeless tobacco: Never   Tobacco comments:    07-15-2017  per pt quit smoking cig. 05-15-2017 but occasionally vapes  Vaping Use   Vaping Use: Some days  Substance and Sexual Activity   Alcohol use: Not Currently    Comment: rarely   Drug use: No   Sexual activity: Yes    Birth control/protection: None    Comment: Last menstral cycle approximately 2 weeks ago "Late August".  Pt denies  being pregnant.  Other Topics Concern   Not on file  Social History Narrative   Not on file   Social Determinants of Health   Financial Resource Strain: Not on file  Food Insecurity: Not on file  Transportation Needs: Not on file  Physical Activity: Not on file  Stress: Not on file  Social Connections: Not on file    Allergies: Allergies  Allergen Reactions   Bactrim [Sulfamethoxazole-Trimethoprim] Nausea And Vomiting   Bismuth-Containing Compounds Nausea And Vomiting   Doxycycline Nausea And Vomiting   Vancomycin Itching    Severe pruritus of scalp, flushing   Vantin [Cefpodoxime] Other (See Comments)    "unknown childhood reaction"    Outpatient Meds: Current Outpatient Medications  Medication Sig Dispense Refill   albuterol (VENTOLIN HFA) 108 (90 Base) MCG/ACT inhaler Inhale into the lungs.     amitriptyline (ELAVIL) 10 MG tablet TAKE 1 TABLET BY MOUTH EVERYDAY AT BEDTIME 90 tablet 2   cetirizine (ZYRTEC) 10 MG tablet Take 10 mg by mouth daily.     clindamycin (CLEOCIN T) 1 % SWAB Apply 1 Application topically 2  (two) times daily.     clobetasol cream (TEMOVATE) 8.10 % Apply 1 Application topically 2 (two) times daily.     diclofenac Sodium (VOLTAREN) 1 % GEL Apply 2-3 g topically 4 (four) times daily as needed.     dicyclomine (BENTYL) 10 MG capsule TAKE 1 CAPSULE 2-3 TIMES A DAY AS NEEDED FOR ABDOMINAL CRAMPS 60 capsule 1   fluticasone-salmeterol (ADVAIR DISKUS) 250-50 MCG/ACT AEPB INHALE 1 PUFF BY MOUTH INTO THE LUNGS IN THE MORNING AND AT BEDTIME 180 each 1   hydrocortisone cream 1 % Apply to affected area 2 times daily 15 g 0   lubiprostone (AMITIZA) 8 MCG capsule TAKE 1 CAPSULE (8 MCG TOTAL) BY MOUTH 2 (TWO) TIMES DAILY WITH A MEAL. 180 capsule 2   montelukast (SINGULAIR) 10 MG tablet Take 10 mg by mouth daily.     Multiple Vitamin (MULTIVITAMIN) capsule Take 1 capsule by mouth daily. Immune supplement     omeprazole (PRILOSEC) 40 MG capsule Take 1 capsule (40 mg total) by mouth daily. 90 capsule 1   ondansetron (ZOFRAN) 4 MG tablet TAKE 1 TABLET BY MOUTH EVERY 8 HOURS AS NEEDED FOR NAUSEA AND VOMITING 60 tablet 1   oxyCODONE-acetaminophen (PERCOCET) 5-325 MG tablet Take 1-2 tablets by mouth every 6 (six) hours as needed for severe pain. 20 tablet 0   Risankizumab-rzaa (SKYRIZI) 180 MG/1.2ML SOCT Inject (1.2 ml) under the skin on week 12 (01/31/22), then every 8 weeks thereafter 1.2 mL 6   sertraline (ZOLOFT) 50 MG tablet Take 1 tablet by mouth daily.     triamcinolone cream (KENALOG) 0.5 % Apply 1 application  topically 3 (three) times daily.     Zinc Sulfate (ZINC 15 PO) Take by mouth every morning.     linaclotide (LINZESS) 72 MCG capsule Take 1 capsule (72 mcg total) by mouth daily before breakfast. (Patient not taking: Reported on 01/31/2022) 90 capsule 1   mesalamine (ROWASA) 4 g enema PLACE 60 MLS RECTALLY AT BEDTIME. RETAIN OVERNIGHT IF POSSIBLE (Patient not taking: Reported on 10/15/2021) 420 mL 1   mupirocin ointment (BACTROBAN) 2 % Apply 1 Application topically 2 (two) times daily. (Patient  not taking: Reported on 10/15/2021)     No current facility-administered medications for this visit.      ___________________________________________________________________ Objective   Exam:  BP 122/86   Pulse (!) 105   Ht 5' (  1.524 m)   Wt 163 lb (73.9 kg)   BMI 31.83 kg/m  Wt Readings from Last 3 Encounters:  01/31/22 163 lb (73.9 kg)  01/03/22 165 lb 12.8 oz (75.2 kg)  12/06/21 160 lb 6.4 oz (72.8 kg)    General: Well-appearing Eyes: sclera anicteric, no redness ENT: oral mucosa moist without lesions, no cervical or supraclavicular lymphadenopathy CV: Regular without murmur, no JVD, no peripheral edema Resp: clear to auscultation bilaterally, normal RR and effort noted GI: soft, no tenderness, with active bowel sounds. No guarding or palpable organomegaly noted. Skin; warm and dry, no rash or jaundice noted Neuro: awake, alert and oriented x 3. Normal gross motor function and fluent speech  No recent data for review  Assessment: Encounter Diagnoses  Name Primary?   Crohn's disease of both small and large intestine with rectal bleeding (HCC) Yes   Long-term use of immunosuppressant medication     Rectosigmoid and terminal ileal Crohn's disease, clinically improved at first on Stelara and then more recently after change to [employer] for insurance reasons.  Chronic idiopathic constipation for which she needs periodic Amitiza.  Recent GI illness possibly infectious, recovered now.  Plan:  Labs today: CBC, CMP, TSH, QuantiFERON gold, vitamin D, ESR, CRP Fecal calprotectin  She will see me in clinic early February and then we can determine the appropriate timing of colonoscopy to evaluate the Crohn's activity   33 minutes were spent on this encounter (including chart review, history/exam, counseling/coordination of care, and documentation) > 50% of that time was spent on counseling and coordination of care.  [name]

## 2022-02-01 LAB — COMPREHENSIVE METABOLIC PANEL
ALT: 9 U/L (ref 0–35)
AST: 13 U/L (ref 0–37)
Albumin: 4.7 g/dL (ref 3.5–5.2)
Alkaline Phosphatase: 99 U/L (ref 39–117)
BUN: 8 mg/dL (ref 6–23)
CO2: 23 mEq/L (ref 19–32)
Calcium: 9.3 mg/dL (ref 8.4–10.5)
Chloride: 102 mEq/L (ref 96–112)
Creatinine, Ser: 0.59 mg/dL (ref 0.40–1.20)
GFR: 123.87 mL/min (ref >60.00)
Glucose, Bld: 85 mg/dL (ref 70–99)
Potassium: 3.6 mEq/L (ref 3.5–5.1)
Sodium: 139 mEq/L (ref 135–145)
Total Bilirubin: 0.2 mg/dL (ref 0.2–1.2)
Total Protein: 7.6 g/dL (ref 6.0–8.3)

## 2022-02-01 LAB — TSH: TSH: 0.99 u[IU]/mL (ref 0.35–5.50)

## 2022-02-01 LAB — HIGH SENSITIVITY CRP: CRP, High Sensitivity: 14.12 mg/L — ABNORMAL HIGH (ref 0.000–5.000)

## 2022-02-02 LAB — QUANTIFERON-TB GOLD PLUS
Mitogen-NIL: >10 IU/mL
NIL: 0.05 IU/mL
QuantiFERON-TB Gold Plus: NEGATIVE
TB1-NIL: <0 IU/mL
TB2-NIL: <0 IU/mL

## 2022-02-04 ENCOUNTER — Encounter: Payer: Self-pay | Admitting: Gastroenterology

## 2022-02-04 DIAGNOSIS — R110 Nausea: Secondary | ICD-10-CM

## 2022-02-04 DIAGNOSIS — K50811 Crohn's disease of both small and large intestine with rectal bleeding: Secondary | ICD-10-CM

## 2022-02-04 DIAGNOSIS — Z7960 Long term (current) use of unspecified immunomodulators and immunosuppressants: Secondary | ICD-10-CM

## 2022-02-04 MED ORDER — PLENVU 140 G PO SOLR: 1.0000 | ORAL | 0 refills | Status: DC

## 2022-02-04 NOTE — Telephone Encounter (Signed)
Please see message stream with Paulena.  Schedule EGD and colonoscopy for Crohn's disease in [place].  Looks like I could do it Jan 17th in the AM if we move the last 2 AM cases 30 minutes later.  And we can cancel the early Feb office visit since we are doing these scopes.  - HD

## 2022-02-04 NOTE — Telephone Encounter (Signed)
ECL scheduled in the [facility] on Wednesday, 03/06/21 at 7:30 am. Arriving at 7 am with a care partner. Ambulatory referral to GI in epic. Plenvu sent to pharmacy on file. Colonoscopy instructions sent to patient via MyChart.

## 2022-02-06 LAB — VITAMIN D 1,25 DIHYDROXY
Vitamin D 1, 25 (OH)2 Total: 59 pg/mL (ref 18–72)
Vitamin D2 1, 25 (OH)2: <8 pg/mL
Vitamin D3 1, 25 (OH)2: 59 pg/mL

## 2022-02-07 ENCOUNTER — Other Ambulatory Visit: Payer: Commercial Managed Care - HMO

## 2022-02-07 DIAGNOSIS — Z7960 Long term (current) use of unspecified immunomodulators and immunosuppressants: Secondary | ICD-10-CM

## 2022-02-07 DIAGNOSIS — K50811 Crohn's disease of both small and large intestine with rectal bleeding: Secondary | ICD-10-CM

## 2022-02-08 LAB — FECAL LACTOFERRIN, QUANT
Fecal Lactoferrin: NEGATIVE
MICRO NUMBER:: 14345798
SPECIMEN QUALITY:: ADEQUATE

## 2022-02-20 DIAGNOSIS — L02212 Cutaneous abscess of back [any part, except buttock]: Secondary | ICD-10-CM | POA: Diagnosis not present

## 2022-02-23 ENCOUNTER — Other Ambulatory Visit: Payer: Self-pay | Admitting: Gastroenterology

## 2022-02-27 ENCOUNTER — Encounter: Payer: Self-pay | Admitting: Gastroenterology

## 2022-02-28 ENCOUNTER — Encounter: Payer: Self-pay | Admitting: Gastroenterology

## 2022-03-04 ENCOUNTER — Encounter: Payer: Self-pay | Admitting: Certified Registered Nurse Anesthetist

## 2022-03-05 ENCOUNTER — Encounter: Payer: Self-pay | Admitting: Gastroenterology

## 2022-03-06 ENCOUNTER — Encounter: Payer: Self-pay | Admitting: Gastroenterology

## 2022-03-06 ENCOUNTER — Telehealth: Payer: Self-pay

## 2022-03-06 ENCOUNTER — Ambulatory Visit (AMBULATORY_SURGERY_CENTER): Payer: 59 | Admitting: Gastroenterology

## 2022-03-06 VITALS — BP 110/70 | HR 88 | Temp 97.5°F | Resp 20 | Ht 60.0 in | Wt 163.0 lb

## 2022-03-06 DIAGNOSIS — K50818 Crohn's disease of both small and large intestine with other complication: Secondary | ICD-10-CM | POA: Diagnosis not present

## 2022-03-06 DIAGNOSIS — K50811 Crohn's disease of both small and large intestine with rectal bleeding: Secondary | ICD-10-CM | POA: Diagnosis not present

## 2022-03-06 DIAGNOSIS — G8929 Other chronic pain: Secondary | ICD-10-CM

## 2022-03-06 DIAGNOSIS — K6389 Other specified diseases of intestine: Secondary | ICD-10-CM | POA: Diagnosis not present

## 2022-03-06 DIAGNOSIS — R69 Illness, unspecified: Secondary | ICD-10-CM | POA: Diagnosis not present

## 2022-03-06 DIAGNOSIS — R1013 Epigastric pain: Secondary | ICD-10-CM | POA: Diagnosis not present

## 2022-03-06 MED ORDER — SODIUM CHLORIDE 0.9 % IV SOLN: 500.0000 mL | Freq: Once | INTRAVENOUS | Status: DC

## 2022-03-06 NOTE — Patient Instructions (Signed)
Please read handouts provided. Continue present medications. Await pathology results. Return to see Dr. Loletha Carrow in clinic in 3 months.   YOU HAD AN ENDOSCOPIC PROCEDURE TODAY AT [facility]:   Refer to the procedure report that was given to you for any specific questions about what was found during the examination.  If the procedure report does not answer your questions, please call your gastroenterologist to clarify.  If you requested that your care partner not be given the details of your procedure findings, then the procedure report has been included in a sealed envelope for you to review at your convenience later.  YOU SHOULD EXPECT: Some feelings of bloating in the abdomen. Passage of more gas than usual.  Walking can help get rid of the air that was put into your GI tract during the procedure and reduce the bloating. If you had a lower endoscopy (such as a colonoscopy or flexible sigmoidoscopy) you may notice spotting of blood in your stool or on the toilet paper. If you underwent a bowel prep for your procedure, you may not have a normal bowel movement for a few days.  Please Note:  You might notice some irritation and congestion in your nose or some drainage.  This is from the oxygen used during your procedure.  There is no need for concern and it should clear up in a day or so.  SYMPTOMS TO REPORT IMMEDIATELY:  Following lower endoscopy (colonoscopy or flexible sigmoidoscopy):  Excessive amounts of blood in the stool  Significant tenderness or worsening of abdominal pains  Swelling of the abdomen that is new, acute  Fever of 100F or higher  Following upper endoscopy (EGD)  Vomiting of blood or coffee ground material  New chest pain or pain under the shoulder blades  Painful or persistently difficult swallowing  New shortness of breath  Fever of 100F or higher  Black, tarry-looking stools  For urgent or emergent issues, a gastroenterologist can be reached at any  hour by calling [phone]. Do not use MyChart messaging for urgent concerns.    DIET:  We do recommend a small meal at first, but then you may proceed to your regular diet.  Drink plenty of fluids but you should avoid alcoholic beverages for 24 hours.  ACTIVITY:  You should plan to take it easy for the rest of today and you should NOT DRIVE or use heavy machinery until tomorrow (because of the sedation medicines used during the test).    FOLLOW UP: Our staff will call the number listed on your records the next business day following your procedure.  We will call around 7:15- 8:00 am to check on you and address any questions or concerns that you may have regarding the information given to you following your procedure. If we do not reach you, we will leave a message.     If any biopsies were taken you will be contacted by phone or by letter within the next 1-3 weeks.  Please call us at [phone] if you have not heard about the biopsies in 3 weeks.    SIGNATURES/CONFIDENTIALITY: You and/or your care partner have signed paperwork which will be entered into your electronic medical record.  These signatures attest to the fact that that the information above on your After Visit Summary has been reviewed and is understood.  Full responsibility of the confidentiality of this discharge information lies with you and/or your care-partner.

## 2022-03-06 NOTE — Progress Notes (Signed)
0732 Robinul 0.1 mg IV given due large amount of secretions upon assessment.  Patient experiencing nausea.  MD updated and Zofran 4 mg IV given, vss  Patient denies she is pregnant.

## 2022-03-06 NOTE — Progress Notes (Signed)
Called to room to assist during endoscopic procedure.  Patient ID and intended procedure confirmed with present staff. Received instructions for my participation in the procedure from the performing physician.

## 2022-03-06 NOTE — Progress Notes (Signed)
VS by CW  Pt's states no medical or surgical changes since previsit or office visit.   Pt states no chance she is pregnant. Does not want to take pregnancy test.

## 2022-03-06 NOTE — Progress Notes (Signed)
History and Physical:  This patient presents for endoscopic testing for: Encounter Diagnoses  Name Primary?   Crohn's disease of both small and large intestine with rectal bleeding (HCC) Yes   Abdominal pain, chronic, epigastric     27 year old woman well-known to me for Crohn's colitis and ileitis here for evaluation of disease activity as well as investigation of epigastric pain.  Clinical details in 01/31/2022 office note.  Subsequent blood inflammatory markers elevated.  Patient is otherwise without complaints or active issues today.   Past Medical History: Past Medical History:  Diagnosis Date   Allergy    cats   Anemia    Anxiety    panic attacks   Asthma    as a child - dirty cats will cause an asthma attack   Depression    GERD (gastroesophageal reflux disease)    Hemorrhoids    History of asthma    childhood   IBS (irritable bowel syndrome)    Pilonidal cyst    Wears glasses      Past Surgical History: Past Surgical History:  Procedure Laterality Date   ANAL RECTAL MANOMETRY N/A 07/24/2020   Procedure: ANO RECTAL MANOMETRY;  Surgeon: Doran Stabler, MD;  Location: [facility];  Service: Gastroenterology;  Laterality: N/A;   COLONOSCOPY     COLONOSCOPY WITH ESOPHAGOGASTRODUODENOSCOPY (EGD)  x2  last one 2015   KNEE ARTHROSCOPY Left 03/30/2020   PILONIDAL CYST EXCISION N/A 07/18/2017   Procedure: CYST EXCISION PILONIDAL;  Surgeon: Clovis Riley, MD;  Location: [facility];  Service: General;  Laterality: N/A;   UPPER GASTROINTESTINAL ENDOSCOPY     WISDOM TOOTH EXTRACTION  2015    Allergies: Allergies  Allergen Reactions   Bactrim [Sulfamethoxazole-Trimethoprim] Nausea And Vomiting   Bismuth-Containing Compounds Nausea And Vomiting   Doxycycline Nausea And Vomiting   Vancomycin Itching    Severe pruritus of scalp, flushing   Vantin [Cefpodoxime] Other (See Comments)    "unknown childhood reaction"    Outpatient Meds: Current  Outpatient Medications  Medication Sig Dispense Refill   amitriptyline (ELAVIL) 10 MG tablet TAKE 1 TABLET BY MOUTH EVERYDAY AT BEDTIME 90 tablet 2   cetirizine (ZYRTEC) 10 MG tablet Take 10 mg by mouth daily.     clindamycin (CLEOCIN T) 1 % SWAB Apply 1 Application topically 2 (two) times daily.     lubiprostone (AMITIZA) 8 MCG capsule TAKE 1 CAPSULE (8 MCG TOTAL) BY MOUTH 2 (TWO) TIMES DAILY WITH A MEAL. 180 capsule 2   montelukast (SINGULAIR) 10 MG tablet Take 10 mg by mouth daily.     omeprazole (PRILOSEC) 40 MG capsule Take 1 capsule (40 mg total) by mouth daily. 90 capsule 1   ondansetron (ZOFRAN) 4 MG tablet TAKE 1 TABLET BY MOUTH EVERY 8 HOURS AS NEEDED FOR NAUSEA AND VOMITING 60 tablet 1   sertraline (ZOLOFT) 50 MG tablet Take 1 tablet by mouth daily.     albuterol (VENTOLIN HFA) 108 (90 Base) MCG/ACT inhaler Inhale into the lungs.     clobetasol cream (TEMOVATE) 5.17 % Apply 1 Application topically 2 (two) times daily.     diclofenac Sodium (VOLTAREN) 1 % GEL Apply 2-3 g topically 4 (four) times daily as needed.     dicyclomine (BENTYL) 10 MG capsule TAKE 1 CAPSULE 2-3 TIMES A DAY AS NEEDED FOR ABDOMINAL CRAMPS 60 capsule 1   fluticasone-salmeterol (ADVAIR DISKUS) 250-50 MCG/ACT AEPB INHALE 1 PUFF BY MOUTH INTO THE LUNGS IN THE MORNING AND AT BEDTIME  180 each 1   hydrocortisone cream 1 % Apply to affected area 2 times daily 15 g 0   hydrOXYzine (ATARAX) 25 MG tablet Take 25 mg by mouth daily.     linaclotide (LINZESS) 72 MCG capsule Take 1 capsule (72 mcg total) by mouth daily before breakfast. (Patient not taking: Reported on 03/06/2022) 90 capsule 1   mesalamine (ROWASA) 4 g enema PLACE 60 MLS RECTALLY AT BEDTIME. RETAIN OVERNIGHT IF POSSIBLE (Patient not taking: Reported on 10/15/2021) 420 mL 1   Multiple Vitamin (MULTIVITAMIN) capsule Take 1 capsule by mouth daily. Immune supplement     mupirocin ointment (BACTROBAN) 2 % Apply 1 Application topically 2 (two) times daily. (Patient not  taking: Reported on 10/15/2021)     oxyCODONE-acetaminophen (PERCOCET) 5-325 MG tablet Take 1-2 tablets by mouth every 6 (six) hours as needed for severe pain. 20 tablet 0   Risankizumab-rzaa (SKYRIZI) 180 MG/1.2ML SOCT Inject (1.2 ml) under the skin on week 12 (01/31/22), then every 8 weeks thereafter 1.2 mL 6   triamcinolone cream (KENALOG) 0.5 % Apply 1 application  topically 3 (three) times daily.     Zinc Sulfate (ZINC 15 PO) Take by mouth every morning.     Current Facility-Administered Medications  Medication Dose Route Frequency Provider Last Rate Last Admin   0.9 %  sodium chloride infusion  500 mL Intravenous Once Danis, Estill Cotta III, MD          ___________________________________________________________________ Objective   Exam:  BP (!) 143/90   Pulse 97   Temp (!) 97.5 F (36.4 C)   Ht 5' (1.524 m)   Wt 163 lb (73.9 kg)   SpO2 100%   BMI 31.83 kg/m   CV: regular , S1/S2 Resp: clear to auscultation bilaterally, normal RR and effort noted GI: soft, no tenderness, with active bowel sounds.   Assessment: Encounter Diagnoses  Name Primary?   Crohn's disease of both small and large intestine with rectal bleeding (HCC) Yes   Abdominal pain, chronic, epigastric      Plan: Colonoscopy EGD  The benefits and risks of the planned procedure were described in detail with the patient or (when appropriate) their health care proxy.  Risks were outlined as including, but not limited to, bleeding, infection, perforation, adverse medication reaction leading to cardiac or pulmonary decompensation, pancreatitis (if ERCP).  The limitation of incomplete mucosal visualization was also discussed.  No guarantees or warranties were given.    The patient is appropriate for an endoscopic procedure in the ambulatory setting.   - Wilfrid Lund, MD

## 2022-03-06 NOTE — Telephone Encounter (Signed)
Monchell, will you please initiate a PA ASAP for Skyrizi Inject (1.2 ml) under the skin on week 12 (01/31/22), then every 8 weeks thereafter

## 2022-03-06 NOTE — Progress Notes (Signed)
8937  Pt experienced laryngeal spasm with jaw thrust performed. vss

## 2022-03-06 NOTE — Progress Notes (Signed)
0810 Albuterol neb given for wheezing and O2 sats 90- to 92% on RA.  MD updated. vss

## 2022-03-06 NOTE — Progress Notes (Signed)
Report given to PACU, vss 

## 2022-03-06 NOTE — Telephone Encounter (Addendum)
-----  Message from Doran Stabler, MD sent at 03/06/2022  3:53 PM EST ----- Regarding: new PA for [facility],   This Crohn's patient of mine had scopes today.  She changed insurance and told me a new PA is probably needed for [employer].  Currently taking the SQ dosing.  Her condition's been hard to control on multiple other meds, but finally in complete remission on Skyrizi.  Thanks  - HD

## 2022-03-06 NOTE — Op Note (Signed)
[facility] Patient Name: Marissa Morales Procedure Date: 03/06/2022 7:23 AM MRN: [id number] Endoscopist: Mallie Mussel L. Loletha Carrow , MD, [id number] Age: 27 Referring MD:  Date of Birth: [birth year] Gender: Female Account #: [id number] Procedure:                Upper GI endoscopy Indications:              Epigastric abdominal pain, Crohn's ileitis and                            proctosigmoiditis Medicines:                Monitored Anesthesia Care Procedure:                Pre-Anesthesia Assessment:                           - Prior to the procedure, a History and Physical                            was performed, and patient medications and                            allergies were reviewed. The patient's tolerance of                            previous anesthesia was also reviewed. The risks                            and benefits of the procedure and the sedation                            options and risks were discussed with the patient.                            All questions were answered, and informed consent                            was obtained. Prior Anticoagulants: The patient has                            taken no anticoagulant or antiplatelet agents. ASA                            Grade Assessment: II - A patient with mild systemic                            disease. After reviewing the risks and benefits,                            the patient was deemed in satisfactory condition to                            undergo the procedure.  After obtaining informed consent, the endoscope was                            passed under direct vision. Throughout the                            procedure, the patient's blood pressure, pulse, and                            oxygen saturations were monitored continuously. The                            GIF HQ190 #[id number] was introduced through the                            mouth, and advanced to the second part of  duodenum.                            The upper GI endoscopy was accomplished without                            difficulty. The patient tolerated the procedure                            well. Scope In: Scope Out: Findings:                 The esophagus was normal.                           A few small sessile fundic gland polyps were found                            in the gastric body.                           Patchy mildly congested mucosa was found in the                            gastric antrum (mild cobblestoning). Several                            biopsies were obtained in the gastric body and in                            the gastric antrum with cold forceps for histology.                           The exam of the stomach was otherwise normal.                           The cardia and gastric fundus were normal on                            retroflexion.  The examined duodenum was normal. Complications:            No immediate complications. Estimated Blood Loss:     Estimated blood loss was minimal. Impression:               - Normal esophagus.                           - A few fundic gland polyps.                           - Congestive gastropathy.                           - Normal examined duodenum.                           - Several biopsies were obtained in the gastric                            body and in the gastric antrum. Recommendation:           - Patient has a contact number available for                            emergencies. The signs and symptoms of potential                            delayed complications were discussed with the                            patient. Return to normal activities tomorrow.                            Written discharge instructions were provided to the                            patient.                           - Resume previous diet.                           - Continue present medications.                            - Await pathology results.                           - See the other procedure note for documentation of                            additional recommendations. Jodi Kappes L. Loletha Carrow, MD 03/06/2022 8:18:02 AM This report has been signed electronically.

## 2022-03-06 NOTE — Op Note (Signed)
Marissa Morales Patient Name: Marissa Morales Procedure Date: 03/06/2022 7:24 AM MRN: [id number] Endoscopist: Mallie Mussel L. Loletha Carrow , MD, [id number] Age: 27 Referring MD:  Date of Birth: 24-Mar-1995 Gender: Female Account #: [id number] Procedure:                Colonoscopy Indications:              Disease activity assessment of Crohn's disease of                            the small bowel and colon (mild ileitis, more                            prominent and chronic proctosigmoiditis)                           Clinical details in 01/31/2022 office note.                            Subsequent serum inflammatory markers elevated (CRP                            14, ESR 44)                           Patient symptomatically much improved on Skyrizi Medicines:                Monitored Anesthesia Care Procedure:                Pre-Anesthesia Assessment:                           - Prior to the procedure, a History and Physical                            was performed, and patient medications and                            allergies were reviewed. The patient's tolerance of                            previous anesthesia was also reviewed. The risks                            and benefits of the procedure and the sedation                            options and risks were discussed with the patient.                            All questions were answered, and informed consent                            was obtained. Prior Anticoagulants: The patient has  taken no anticoagulant or antiplatelet agents. ASA                            Grade Assessment: II - A patient with mild systemic                            disease. After reviewing the risks and benefits,                            the patient was deemed in satisfactory condition to                            undergo the procedure.                           After obtaining informed consent, the colonoscope                             was passed under direct vision. Throughout the                            procedure, the patient's blood pressure, pulse, and                            oxygen saturations were monitored continuously. The                            Olympus CF-[id number]) Colonoscope was                            introduced through the anus and advanced to the the                            terminal ileum, with identification of the                            appendiceal orifice and IC valve. The colonoscopy                            was performed without difficulty. The patient                            tolerated the procedure well. The quality of the                            bowel preparation was generally good after lavage.                            The terminal ileum, ileocecal valve, appendiceal                            orifice, and rectum were photographed. The bowel  preparation used was Miralax via split dose                            instruction. (Patient was unable to tolerate Plenvu) Scope In: 7:54:06 AM Scope Out: 8:07:00 AM Scope Withdrawal Time: 0 hours 10 minutes 22 seconds  Total Procedure Duration: 0 hours 12 minutes 54 seconds  Findings:                 The perianal and digital rectal examinations were                            normal. Specifically, no perianal Crohn's activity                            was seen.                           The terminal ileum appeared normal.                           Normal mucosa was found in the entire colon.                           Retroflexion in the rectum was not performed due to                            anatomy (and coughing/difficulty retaining air). Complications:            No immediate complications. Estimated Blood Loss:     Estimated blood loss: none. Impression:               - The examined portion of the ileum was normal.                           - Normal mucosa in the entire  examined colon.                           - No specimens collected.                           This patient has complete endoscopic remission of                            her Crohn's ileitis and proctosigmoiditis on                            current therapy. Recommendation:           - Patient has a contact number available for                            emergencies. The signs and symptoms of potential                            delayed complications were discussed with the  patient. Return to normal activities tomorrow.                            Written discharge instructions were provided to the                            patient.                           - Resume previous diet.                           - Continue present medications.                           - No recommendation at this time regarding repeat                            colonoscopy. (Future endoscopic plans depending on                            clinical course)                           - See the other procedure note for documentation of                            additional recommendations.                           - Return to my office in 3 months. Rochele Lueck L. Loletha Carrow, MD 03/06/2022 8:24:37 AM This report has been signed electronically.

## 2022-03-07 ENCOUNTER — Ambulatory Visit: Payer: Commercial Managed Care - HMO | Admitting: Gastroenterology

## 2022-03-07 ENCOUNTER — Encounter: Payer: Self-pay | Admitting: Gastroenterology

## 2022-03-07 ENCOUNTER — Telehealth: Payer: Self-pay | Admitting: Pharmacy Technician

## 2022-03-07 ENCOUNTER — Telehealth: Payer: Self-pay | Admitting: *Deleted

## 2022-03-07 ENCOUNTER — Other Ambulatory Visit (HOSPITAL_COMMUNITY): Payer: Self-pay

## 2022-03-07 NOTE — Telephone Encounter (Signed)
Attempted f/u phone call. No answer. Left message. °

## 2022-03-07 NOTE — Telephone Encounter (Signed)
PA has been submitted as expedited, and new telephone encounter has been created

## 2022-03-07 NOTE — Telephone Encounter (Signed)
Patient Advocate Encounter  Received notification from [facility] that prior authorization for [facility] '180MG'$  is required.   PA submitted on 1.18.24 Key [id number] Status is pending

## 2022-03-08 NOTE — Telephone Encounter (Signed)
PA has been approved. Pt notified via [place].

## 2022-03-08 NOTE — Telephone Encounter (Signed)
Patient Advocate Encounter  Prior Authorization for [employer] '180MG'$ /1.2ML  SOCT has been approved.     Effective: 03-07-2022 to 03-07-2023

## 2022-03-08 NOTE — Telephone Encounter (Signed)
Noted, thanks. Patient notified via [place].

## 2022-03-13 DIAGNOSIS — J069 Acute upper respiratory infection, unspecified: Secondary | ICD-10-CM | POA: Diagnosis not present

## 2022-03-13 DIAGNOSIS — Z6830 Body mass index (BMI) 30.0-30.9, adult: Secondary | ICD-10-CM | POA: Diagnosis not present

## 2022-03-13 NOTE — Telephone Encounter (Signed)
Patient Advocate Encounter  Prior Authorization for Marissa Morales has been approved.    PA# [id number] Effective dates: 1.18.24 through 1.17.25

## 2022-03-14 ENCOUNTER — Encounter: Payer: Commercial Managed Care - HMO | Admitting: Gastroenterology

## 2022-03-27 ENCOUNTER — Ambulatory Visit: Payer: Commercial Managed Care - HMO | Admitting: Gastroenterology

## 2022-03-28 MED ORDER — SKYRIZI 180 MG/1.2ML ~~LOC~~ SOCT: SUBCUTANEOUS | 6 refills | Status: DC

## 2022-04-01 ENCOUNTER — Other Ambulatory Visit (HOSPITAL_COMMUNITY): Payer: Self-pay

## 2022-04-01 NOTE — Telephone Encounter (Signed)
Called and spoke with representative at the help desk [phone]). A quantity/days supply override had to be placed on file for the patient and it can not be successfully filled at [facility].

## 2022-04-01 NOTE — Telephone Encounter (Signed)
Called [facility] [phone]) and spoke with billing representative, Ronny Bacon. Ronny Bacon informed me that they are getting a notification that PA is required, I explained to her that we already had an approved PA on file. Ronny Bacon stated that RX was written for 2 cartridges every 56 days although RX states dispense (1.2 ml/1 Cartridge) for every 8 weeks. Ronny Bacon stated that we can call the insurance company at [phone] for clarification.

## 2022-04-04 ENCOUNTER — Other Ambulatory Visit (HOSPITAL_COMMUNITY): Payer: Self-pay

## 2022-04-04 DIAGNOSIS — B370 Candidal stomatitis: Secondary | ICD-10-CM | POA: Diagnosis not present

## 2022-04-04 DIAGNOSIS — Z6831 Body mass index (BMI) 31.0-31.9, adult: Secondary | ICD-10-CM | POA: Diagnosis not present

## 2022-04-04 NOTE — Telephone Encounter (Signed)
[place], please see note from patient. There still seems to be an issue and she is not able to fill. Thanks

## 2022-04-04 NOTE — Telephone Encounter (Signed)
This looks like it has been resolved (see snip below). When I test bill it, it shows when next earliest fill. [facility] (filling pharmacy) would need to call their pharmacy help desk to put in the override to allow a fill of 56 day supply versus 30 day supply. The PA is approved for #56 day supply. This was verified. However I/we aren't able to request the override for the filling pharmacy.

## 2022-04-10 ENCOUNTER — Ambulatory Visit: Payer: 59

## 2022-04-10 DIAGNOSIS — G4733 Obstructive sleep apnea (adult) (pediatric): Secondary | ICD-10-CM | POA: Diagnosis not present

## 2022-04-16 ENCOUNTER — Encounter: Payer: Self-pay | Admitting: Gastroenterology

## 2022-04-18 ENCOUNTER — Telehealth: Payer: Self-pay | Admitting: Pharmacy Technician

## 2022-04-18 NOTE — Telephone Encounter (Signed)
Patient Advocate Encounter  Received notification from [place] that prior authorization for LUBIPROSTONE is required.   PA submitted on 2.29.23 Key [id number] Status is pending

## 2022-04-22 ENCOUNTER — Telehealth: Payer: Self-pay | Admitting: Pulmonary Disease

## 2022-04-22 DIAGNOSIS — G4733 Obstructive sleep apnea (adult) (pediatric): Secondary | ICD-10-CM

## 2022-04-22 NOTE — Telephone Encounter (Signed)
Call patient  Sleep study result  Date of study: 04/10/2022  Impression: Mild obstructive sleep apnea Mild oxygen desaturations  Recommendation: Options of treatment for mild obstructive sleep apnea will include  1.  CPAP therapy if there is significant daytime sleepiness or other comorbidities including history of CVA or cardiac disease  -If CPAP is chosen as an option of treatment auto titrating CPAP with a pressure setting of 5-15 will be appropriate  2.  Watchful waiting with emphasis on weight loss measures, sleep position modification to optimize lateral sleep, elevating the head of the bed by about 30 degrees may also help.  3.  An oral device may be fashioned for the treatment of mild sleep disordered breathing, will involve referral to dentist.  Follow-up as previously scheduled

## 2022-04-24 ENCOUNTER — Encounter: Payer: Self-pay | Admitting: Nurse Practitioner

## 2022-04-24 ENCOUNTER — Ambulatory Visit: Payer: 59 | Admitting: Nurse Practitioner

## 2022-04-24 VITALS — BP 124/80 | HR 82 | Temp 98.2°F | Ht 61.0 in | Wt 168.4 lb

## 2022-04-24 DIAGNOSIS — E669 Obesity, unspecified: Secondary | ICD-10-CM | POA: Diagnosis not present

## 2022-04-24 DIAGNOSIS — J4540 Moderate persistent asthma, uncomplicated: Secondary | ICD-10-CM

## 2022-04-24 DIAGNOSIS — G4733 Obstructive sleep apnea (adult) (pediatric): Secondary | ICD-10-CM

## 2022-04-24 NOTE — Patient Instructions (Addendum)
Continue Albuterol inhaler 2 puffs every 6 hours as needed for shortness of breath or wheezing. Notify if symptoms persist despite rescue inhaler/neb use.  Continue cetirizine 1 tab daily Continue Advair 2 puffs Twice daily. Brush tongue and rinse mouth afterwards  Continue montelukast (singulair) 1 tab At bedtime  Continue omeprazole 1 capsule daily  Start CPAP 5-15 cmH2O every night, minimum of 4-6 hours a night.  Change equipment every 30 days or as directed by DME. Wash your tubing with warm soap and water daily, hang to dry. Wash humidifier portion weekly.  Be aware of reduced alertness and do not drive or operate heavy machinery if experiencing this or drowsiness.  Healthy weight management discussed.  Notify if persistent daytime sleepiness occurs even with consistent use of CPAP.  We discussed how untreated sleep apnea puts an individual at risk for cardiac arrhthymias, pulm HTN, DM, stroke and increases their risk for daytime accidents; although, these risks are less with mild sleep apnea. We also briefly reviewed treatment options including weight loss, side sleeping position, oral appliance, CPAP therapy or referral to ENT for possible surgical options  Follow up in 12 weeks with Marissa Morales or Marissa Jersey Clary Meeker,NP to see how CPAP is going. If symptoms do not improve or worsen, please contact office for sooner follow up or seek emergency care.

## 2022-04-24 NOTE — Assessment & Plan Note (Signed)
Compensated on current regimen. No recent exacerbations. Action plan in place.

## 2022-04-24 NOTE — Progress Notes (Signed)
$'@Patient'R$  ID: Marissa Morales, female    DOB: 1996-02-11, 27 y.o.   MRN: [id number]  Chief Complaint  Patient presents with   Follow-up    Hst review     Referring provider: Jettie Booze, NP  HPI: 27 year old female, former smoker followed for asthma. She is a patient of Dr. Mauricio Po and last seen in office 02/05/2021. Past medical history significant for asthma, GERD, crohn's, anxiety and depression.  TEST/EVENTS:  01/31/2021 PFT: FVC 88, FEV 89, ratio 88, DLCOcor 104, TLC 96. No BD  02/05/2021: OV with Dr. Shearon Stalls. Well controlled with Advair. Had COVID last month. Had some wheezing. Course was mild and treated at home. Feeling better. ACT 23. Enlarged tonsils on exam - follow up with ENT. Still awaiting sleep study. Continue PPI for reflux. Ok to try off singulair.   04/24/2022: Today - follow up Patient presents today for follow up to discuss sleep study results. She had home sleep study with mild sleep apnea, AHI 5.7/h. She tells me that she is always tired, even with sleeping 10-12 hours a night. She wakes frequently throughout the night and doesn't feel like her sleep is restful. She takes a nap during the day usually, which helps. She does snore at night. Has occasional morning headaches. Takes amitriptyline and occasionally hydroxyzine at night. Weight up over the past few years. Drinks two cups of coffee a day usually. 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  Regarding her asthma, she feels well controlled. No exacerbations requiring steroids or abx. No hospitalizations. She started taking singulair again. Feels like it controls her allergies and asthma better. Denies any wheezing, chest congestion, PND or cough. Rare use of rescue  Epworth 8  Allergies  Allergen Reactions   Bactrim [Sulfamethoxazole-Trimethoprim] Nausea And Vomiting   Bismuth-Containing Compounds Nausea And Vomiting   Doxycycline Nausea And Vomiting   Vancomycin Itching    Severe pruritus of  scalp, flushing   Vantin [Cefpodoxime] Other (See Comments)    "unknown childhood reaction"    Immunization History  Administered Date(s) Administered   DTaP 04/03/1995, 08/19/1995, 06/07/1996, 05/09/2000   H1N1 01/01/2008   HIB (PRP-OMP) 04/03/1995, 08/19/1995, 06/07/1996   HPV Quadrivalent 08/29/2006, 10/31/2006, 01/01/2008   Hepatitis A 12/20/2008, 09/13/2010   Hepatitis A, Adult 12/20/2008, 09/13/2010   Hepatitis A, Ped/Adol-2 Dose 12/20/2008, 09/13/2010   Hepatitis B, PED/ADOLESCENT 1994[birth year], 03/03/1995, 04/03/1995, 08/19/1995, 06/07/1996, 08/29/2006   IPV 04/03/1995, 08/19/1995, 06/07/1996, 05/09/2000   Influenza Split 12/20/2008, 04/16/2012   Influenza,inj,Quad PF,6+ Mos 11/18/2020   Influenza,inj,quad, With Preservative 12/15/2012   Influenza-Unspecified 12/15/2012   MMR 06/24/1996, 05/09/2000   Meningococcal Conjugate 07/30/2006, 09/13/2010   Moderna Sars-Covid-2 Vaccination 10/14/2019, 11/04/2019   Pneumococcal Conjugate-13 11/26/2019   Pneumococcal Polysaccharide-23 10/16/2020   Tdap 07/30/2006   Varicella 05/09/2000, 07/30/2006   Zoster Recombinat (Shingrix) 08/23/2021    Past Medical History:  Diagnosis Date   Allergy    cats   Anemia    Anxiety    panic attacks   Asthma    as a child - dirty cats will cause an asthma attack   Depression    GERD (gastroesophageal reflux disease)    Hemorrhoids    History of asthma    childhood   IBS (irritable bowel syndrome)    Pilonidal cyst    Wears glasses     Tobacco History: Social History   Tobacco Use  Smoking Status Former   Packs/day: 0.50   Years: 5.00   Total  pack years: 2.50   Types: Cigarettes, E-cigarettes   Quit date: 05/15/2017   Years since quitting: 4.9  Smokeless Tobacco Never  Tobacco Comments   07-15-2017  per pt quit smoking cig. 05-15-2017 but occasionally vapes   Counseling given: Not Answered Tobacco comments: 07-15-2017  per pt quit smoking cig. 05-15-2017 but occasionally  vapes   Outpatient Medications Prior to Visit  Medication Sig Dispense Refill   albuterol (VENTOLIN HFA) 108 (90 Base) MCG/ACT inhaler Inhale into the lungs.     amitriptyline (ELAVIL) 10 MG tablet TAKE 1 TABLET BY MOUTH EVERYDAY AT BEDTIME 90 tablet 2   cetirizine (ZYRTEC) 10 MG tablet Take 10 mg by mouth daily.     clindamycin (CLEOCIN T) 1 % SWAB Apply 1 Application topically 2 (two) times daily.     clobetasol cream (TEMOVATE) [id number] % Apply 1 Application topically 2 (two) times daily.     diclofenac Sodium (VOLTAREN) 1 % GEL Apply 2-3 g topically 4 (four) times daily as needed.     dicyclomine (BENTYL) 10 MG capsule TAKE 1 CAPSULE 2-3 TIMES A DAY AS NEEDED FOR ABDOMINAL CRAMPS 60 capsule 1   fluticasone-salmeterol (ADVAIR DISKUS) 250-50 MCG/ACT AEPB INHALE 1 PUFF BY MOUTH INTO THE LUNGS IN THE MORNING AND AT BEDTIME 180 each 1   hydrocortisone cream 1 % Apply to affected area 2 times daily 15 g 0   hydrOXYzine (ATARAX) 25 MG tablet Take 25 mg by mouth daily.     lubiprostone (AMITIZA) 8 MCG capsule TAKE 1 CAPSULE (8 MCG TOTAL) BY MOUTH 2 (TWO) TIMES DAILY WITH A MEAL. 180 capsule 2   montelukast (SINGULAIR) 10 MG tablet Take 10 mg by mouth daily.     Multiple Vitamin (MULTIVITAMIN) capsule Take 1 capsule by mouth daily. Immune supplement     omeprazole (PRILOSEC) 40 MG capsule Take 1 capsule (40 mg total) by mouth daily. 90 capsule 1   ondansetron (ZOFRAN) 4 MG tablet TAKE 1 TABLET BY MOUTH EVERY 8 HOURS AS NEEDED FOR NAUSEA AND VOMITING 60 tablet 1   oxyCODONE-acetaminophen (PERCOCET) 5-325 MG tablet Take 1-2 tablets by mouth every 6 (six) hours as needed for severe pain. 20 tablet 0   Risankizumab-rzaa (SKYRIZI) 180 MG/1.2ML SOCT Inject (1.2 ml) under the skin every 8 weeks 1.2 mL 6   sertraline (ZOLOFT) 50 MG tablet Take 1 tablet by mouth daily.     triamcinolone cream (KENALOG) 0.5 % Apply 1 application  topically 3 (three) times daily.     Zinc Sulfate (ZINC 15 PO) Take by mouth every  morning.     linaclotide (LINZESS) 72 MCG capsule Take 1 capsule (72 mcg total) by mouth daily before breakfast. (Patient not taking: Reported on 03/06/2022) 90 capsule 1   mesalamine (ROWASA) 4 g enema PLACE 60 MLS RECTALLY AT BEDTIME. RETAIN OVERNIGHT IF POSSIBLE (Patient not taking: Reported on 10/15/2021) 420 mL 1   mupirocin ointment (BACTROBAN) 2 % Apply 1 Application topically 2 (two) times daily. (Patient not taking: Reported on 10/15/2021)     No facility-administered medications prior to visit.     Review of Systems:   Constitutional: No weight loss or gain, night sweats, fevers, chills, or lassitude. +excessive daytime fatigue  HEENT: No difficulty swallowing, tooth/dental problems, or sore throat. No sneezing, itching, ear ache, nasal congestion, or post nasal drip. +morning headaches  CV:  No chest pain, orthopnea, PND, swelling in lower extremities, anasarca, dizziness, palpitations, syncope Resp: +snoring; rare shortness of breath with exertion. No excess  mucus or change in color of mucus. No productive or non-productive. No hemoptysis. No wheezing.  No chest wall deformity GI:  No heartburn, indigestion, abdominal pain, nausea, vomiting, diarrhea, change in bowel habits, loss of appetite, bloody stools.  GU: No dysuria, change in color of urine, urgency or frequency.  Skin: No rash, lesions, ulcerations MSK:  No joint pain or swelling.   Neuro: No dizziness or lightheadedness.  Psych: No increased depression or anxiety. Mood stable. +sleep disturbance    Physical Exam:  BP 124/80   Pulse 82   Temp 98.2 F (36.8 C) (Oral)   Ht '5\' 1"'$  (1.549 m)   Wt 168 lb 6.4 oz (76.4 kg)   SpO2 99%   BMI 31.82 kg/m   GEN: Pleasant, interactive, well-appearing; obese; in no acute distress. HEENT:  Normocephalic and atraumatic. PERRLA. Sclera white. Nasal turbinates pink, moist and patent bilaterally. No rhinorrhea present. Oropharynx pink and moist, without exudate or edema. No  lesions, ulcerations, or postnasal drip. Enlarged tonsils. Mallampati III NECK:  Supple w/ fair ROM. No JVD present. Normal carotid impulses w/o bruits. Thyroid symmetrical with no goiter or nodules palpated. No lymphadenopathy.   CV: RRR, no m/r/g, no peripheral edema. Pulses intact, +2 bilaterally. No cyanosis, pallor or clubbing. PULMONARY:  Unlabored, regular breathing. Clear bilaterally A&P w/o wheezes/rales/rhonchi. No accessory muscle use.  GI: BS present and normoactive. Soft, non-tender to palpation. No organomegaly or masses detected.  MSK: No erythema, warmth or tenderness. Cap refil <2 sec all extrem. No deformities or joint swelling noted.  Neuro: A/Ox3. No focal deficits noted.   Skin: Warm, no lesions or rashe Psych: Normal affect and behavior. Judgement and thought content appropriate.     Lab Results:  CBC    Component Value Date/Time   WBC 9.0 01/31/2022 1652   RBC 5.23 (H) 01/31/2022 1652   HGB 12.4 01/31/2022 1652   HCT 37.9 01/31/2022 1652   PLT 486.0 (H) 01/31/2022 1652   MCV 72.4 (L) 01/31/2022 1652   MCH 27.7 03/24/2021 1744   MCHC 32.7 01/31/2022 1652   RDW 18.1 (H) 01/31/2022 1652   LYMPHSABS 3.3 01/31/2022 1652   MONOABS 0.6 01/31/2022 1652   EOSABS 0.2 01/31/2022 1652   BASOSABS 0.1 01/31/2022 1652    BMET    Component Value Date/Time   NA 139 01/31/2022 1652   K 3.6 01/31/2022 1652   CL 102 01/31/2022 1652   CO2 23 01/31/2022 1652   GLUCOSE 85 01/31/2022 1652   BUN 8 01/31/2022 1652   CREATININE 0.59 01/31/2022 1652   CALCIUM 9.3 01/31/2022 1652   GFRNONAA >60 03/24/2021 1744    BNP No results found for: "BNP"   Imaging:  No results found.       Latest Ref Rng & Units 01/31/2021    3:49 PM  PFT Results  FVC-Pre L 2.94   FVC-Predicted Pre % 88   FVC-Post L 2.92   FVC-Predicted Post % 88   Pre FEV1/FVC % % 88   Post FEV1/FCV % % 88   FEV1-Pre L 2.58   FEV1-Predicted Pre % 89   FEV1-Post L 2.57   DLCO uncorrected  ml/min/mmHg 20.41   DLCO UNC% % 104   DLCO corrected ml/min/mmHg 20.41   DLCO COR %Predicted % 104   DLVA Predicted % 102   TLC L 4.28   TLC % Predicted % 96   RV % Predicted % 103     No results found for: "NITRICOXIDE"  Assessment & Plan:   Mild obstructive sleep apnea Very mild OSA with AHI 5.7/h. Reviewed treatment options including oral appliance, weight loss with watchful waiting, positional sleeping, and CPAP. She has significant daytime symptoms and restless sleep at night. She would like to try CPAP to see if she has benefit from use. Orders sent for new start auto CPAP 5-15 cmH2O. Educated on proper use/care. Risks/benefits reviewed. She has enlarged tonsils. Possible contributing factor. Discussed with Dr. Shearon Stalls; unlikely that ENT would perform surgical intervention as she does not have recurrent infections. Advised she could follow up with them if she wished to discuss this further. She was ok with starting with CPAP and seeing how this goes first.  Patient Instructions  Continue Albuterol inhaler 2 puffs every 6 hours as needed for shortness of breath or wheezing. Notify if symptoms persist despite rescue inhaler/neb use.  Continue cetirizine 1 tab daily Continue Advair 2 puffs Twice daily. Brush tongue and rinse mouth afterwards  Continue montelukast (singulair) 1 tab At bedtime  Continue omeprazole 1 capsule daily  Start CPAP 5-15 cmH2O every night, minimum of 4-6 hours a night.  Change equipment every 30 days or as directed by DME. Wash your tubing with warm soap and water daily, hang to dry. Wash humidifier portion weekly.  Be aware of reduced alertness and do not drive or operate heavy machinery if experiencing this or drowsiness.  Healthy weight management discussed.  Notify if persistent daytime sleepiness occurs even with consistent use of CPAP.  We discussed how untreated sleep apnea puts an individual at risk for cardiac arrhthymias, pulm HTN, DM, stroke  and increases their risk for daytime accidents; although, these risks are less with mild sleep apnea. We also briefly reviewed treatment options including weight loss, side sleeping position, oral appliance, CPAP therapy or referral to ENT for possible surgical options  Follow up in 12 weeks with Dr. Shearon Stalls or Joellen Jersey Myrella Fahs,NP to see how CPAP is going. If symptoms do not improve or worsen, please contact office for sooner follow up or seek emergency care.    Obesity (BMI 30.0-34.9) BMI 31.8. Reviewed correlation between obesity and OSA. Healthy weight loss encouraged.   Asthma Compensated on current regimen. No recent exacerbations. Action plan in place.    I spent 35 minutes of dedicated to the care of this patient on the date of this encounter to include pre-visit review of records, face-to-face time with the patient discussing conditions above, post visit ordering of testing, clinical documentation with the electronic health record, making appropriate referrals as documented, and communicating necessary findings to members of the patients care team.  Clayton Bibles, NP 04/24/2022  Pt aware and understands NP's role.

## 2022-04-24 NOTE — Assessment & Plan Note (Addendum)
Very mild OSA with AHI 5.7/h. Reviewed treatment options including oral appliance, weight loss with watchful waiting, positional sleeping, and CPAP. She has significant daytime symptoms and restless sleep at night. She would like to try CPAP to see if she has benefit from use. Orders sent for new start auto CPAP 5-15 cmH2O. Educated on proper use/care. Risks/benefits reviewed. She has enlarged tonsils. Possible contributing factor. Discussed with Dr. Shearon Stalls; unlikely that ENT would perform surgical intervention as she does not have recurrent infections. Advised she could follow up with them if she wished to discuss this further. She was ok with starting with CPAP and seeing how this goes first.  Patient Instructions  Continue Albuterol inhaler 2 puffs every 6 hours as needed for shortness of breath or wheezing. Notify if symptoms persist despite rescue inhaler/neb use.  Continue cetirizine 1 tab daily Continue Advair 2 puffs Twice daily. Brush tongue and rinse mouth afterwards  Continue montelukast (singulair) 1 tab At bedtime  Continue omeprazole 1 capsule daily  Start CPAP 5-15 cmH2O every night, minimum of 4-6 hours a night.  Change equipment every 30 days or as directed by DME. Wash your tubing with warm soap and water daily, hang to dry. Wash humidifier portion weekly.  Be aware of reduced alertness and do not drive or operate heavy machinery if experiencing this or drowsiness.  Healthy weight management discussed.  Notify if persistent daytime sleepiness occurs even with consistent use of CPAP.  We discussed how untreated sleep apnea puts an individual at risk for cardiac arrhthymias, pulm HTN, DM, stroke and increases their risk for daytime accidents; although, these risks are less with mild sleep apnea. We also briefly reviewed treatment options including weight loss, side sleeping position, oral appliance, CPAP therapy or referral to ENT for possible surgical options  Follow up in 12 weeks  with Dr. Shearon Stalls or Joellen Jersey Avyay Coger,NP to see how CPAP is going. If symptoms do not improve or worsen, please contact office for sooner follow up or seek emergency care.

## 2022-04-24 NOTE — Assessment & Plan Note (Addendum)
BMI 31.8. Reviewed correlation between obesity and OSA. Healthy weight loss encouraged.

## 2022-04-25 NOTE — Telephone Encounter (Signed)
Patient was seen Marissa Morales on 04/24/2022 and went over CPAP result's.   Call patient   Sleep study result   Date of study: 04/10/2022   Impression: Mild obstructive sleep apnea Mild oxygen desaturations   Recommendation: Options of treatment for mild obstructive sleep apnea will include   1.  CPAP therapy if there is significant daytime sleepiness or other comorbidities including history of CVA or cardiac disease   -If CPAP is chosen as an option of treatment auto titrating CPAP with a pressure setting of 5-15 will be appropriate   2.  Watchful waiting with emphasis on weight loss measures, sleep position modification to optimize lateral sleep, elevating the head of the bed by about 30 degrees may also help.   3.  An oral device may be fashioned for the treatment of mild sleep disordered breathing, will involve referral to dentist.   Follow-up as previously scheduled    Nothing else further needed.

## 2022-05-01 NOTE — Telephone Encounter (Signed)
Patient Advocate Encounter  Prior Authorization for LUBIPROSTONE [facility] has been approved.    PA# [id number] Effective dates: 3.1.24 through 3.1.27

## 2022-05-07 DIAGNOSIS — G4733 Obstructive sleep apnea (adult) (pediatric): Secondary | ICD-10-CM | POA: Diagnosis not present

## 2022-05-10 ENCOUNTER — Other Ambulatory Visit: Payer: Self-pay | Admitting: Gastroenterology

## 2022-05-14 ENCOUNTER — Other Ambulatory Visit: Payer: Self-pay | Admitting: Gastroenterology

## 2022-05-14 IMAGING — CT CT ABD-PELV W/ CM
2 of 4 series · 16 of 46 positions shown, 18 images · IV contrast (Omnipaque)
Comparison: None.

CLINICAL DATA: History of ulcerative colitis with rectal bleeding

EXAM:
CT ABDOMEN AND PELVIS WITH CONTRAST
TECHNIQUE: Multidetector CT imaging of the abdomen and pelvis was performed
using the standard protocol following bolus administration of
intravenous contrast.
CONTRAST:  100mL OMNIPAQUE IOHEXOL 300 MG/ML  SOLN

[Series 2: axial st · axial · 0.63mm/px · z∈[+184,+579]mm · 13 of 87 slices shown, 15 images]
[im 4/87  soft-tissue]
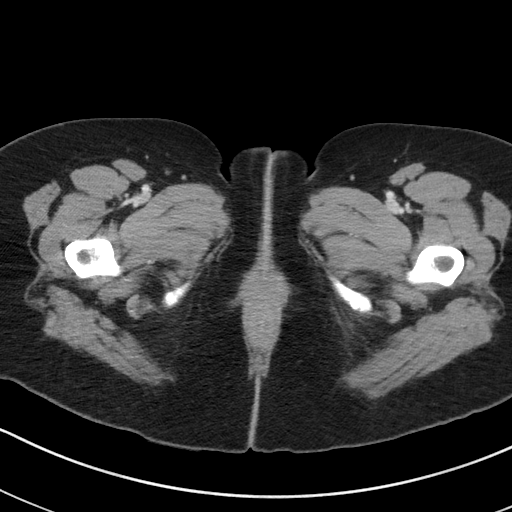
[im 4/87  bone]
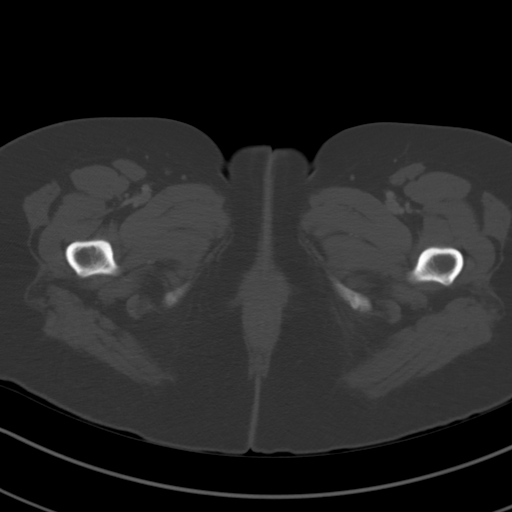
[im 11/87  soft-tissue]
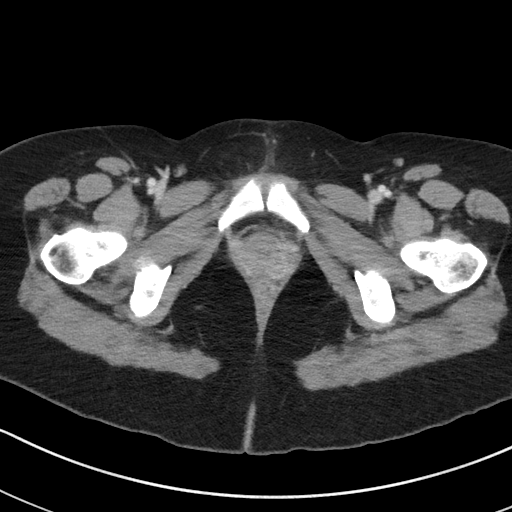
[im 18/87  soft-tissue]
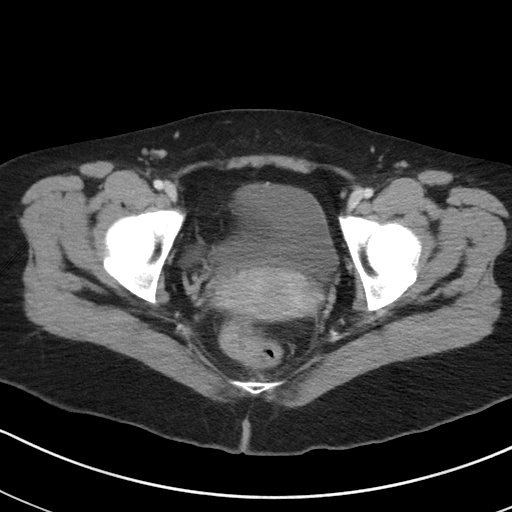
[im 26/87  soft-tissue]
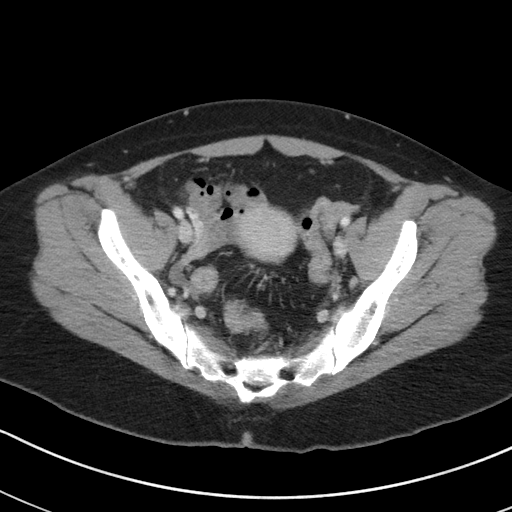
[im 29/87  soft-tissue]
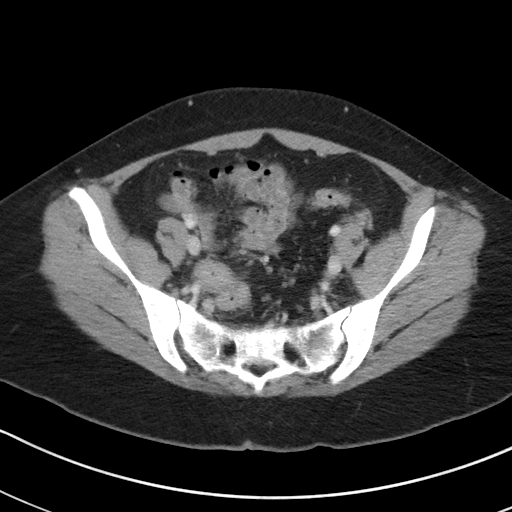
[im 36/87  soft-tissue]
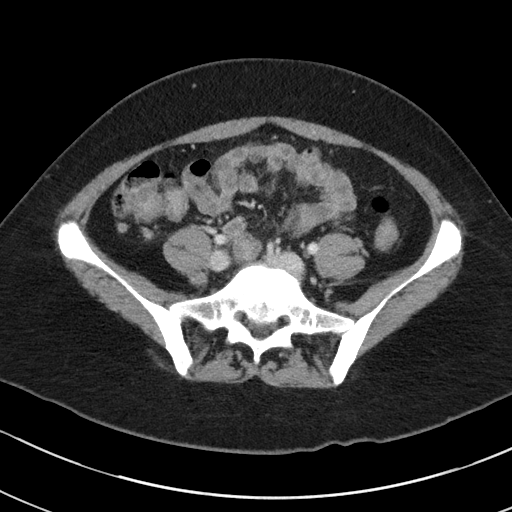
[im 44/87  soft-tissue]
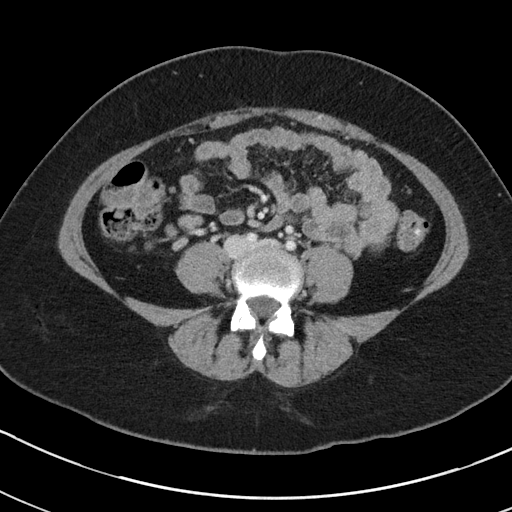
[im 51/87  soft-tissue]
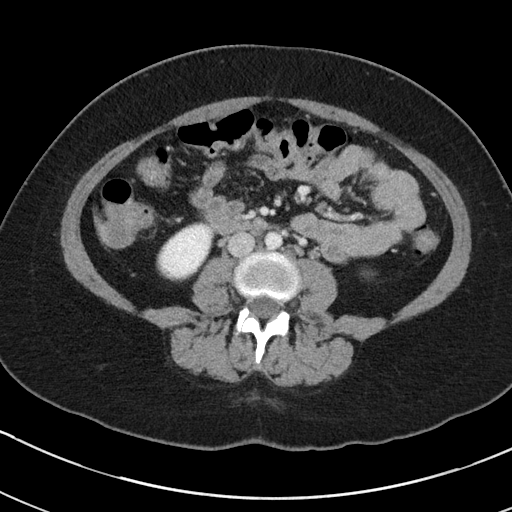
[im 58/87  soft-tissue]
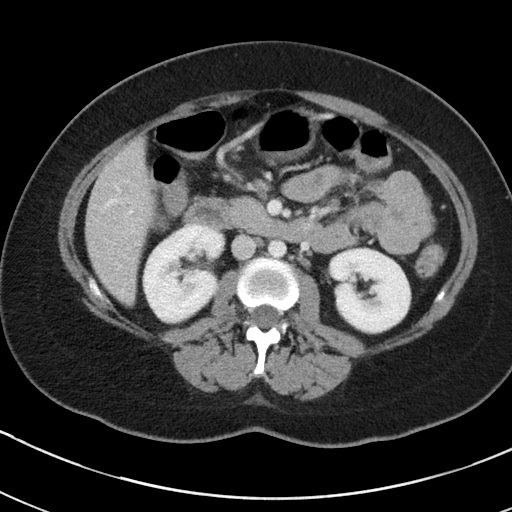
[im 58/87  bone]
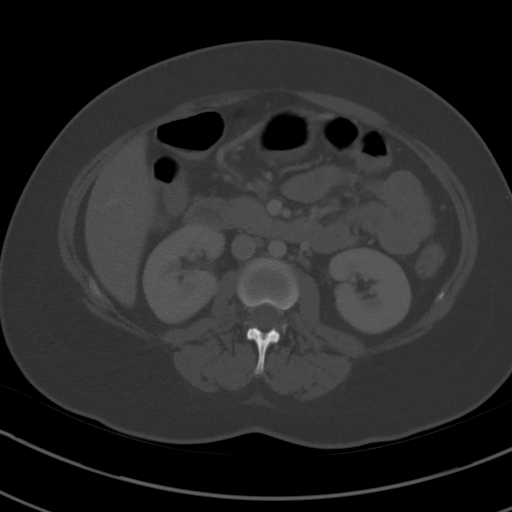
[im 61/87  soft-tissue]
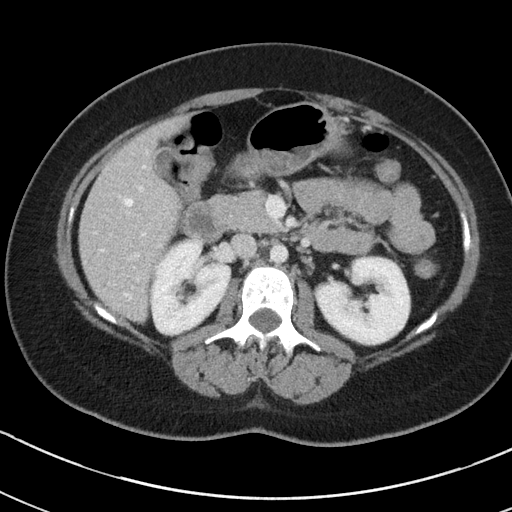
[im 69/87  soft-tissue]
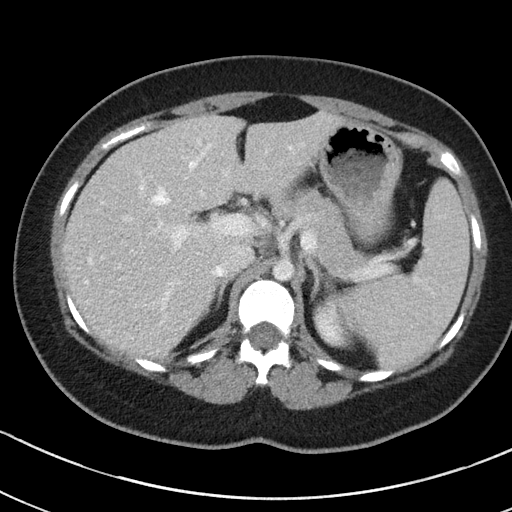
[im 76/87  soft-tissue]
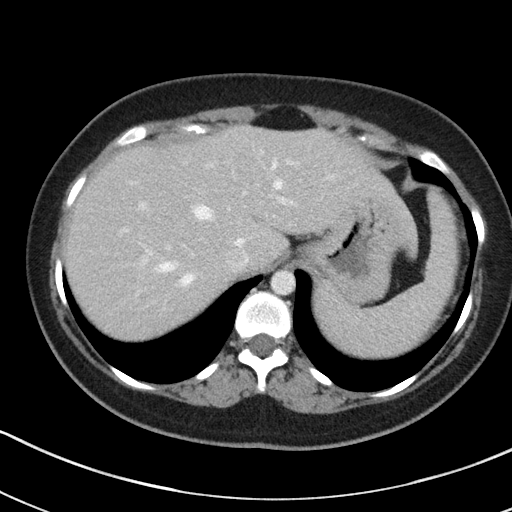
[im 83/87  soft-tissue]
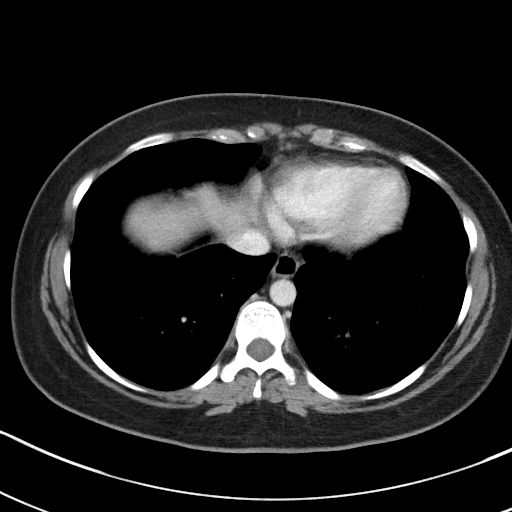

[Series 5: coronal st · coronal · 0.65mm/px · 3 of 96 slices shown]
[im 32/96  soft-tissue]
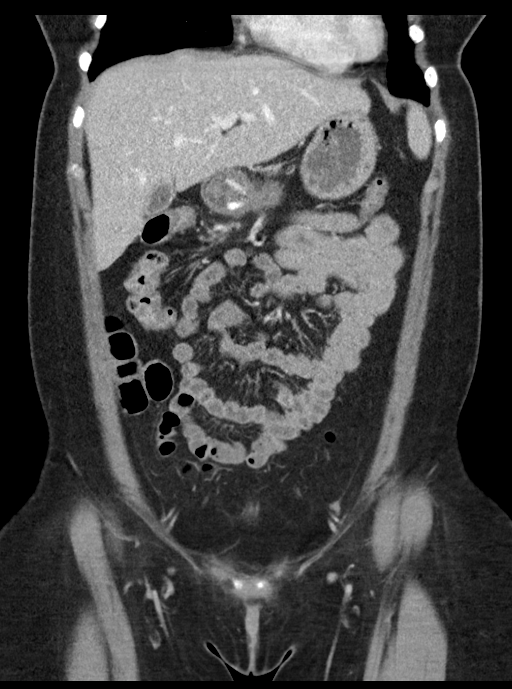
[im 43/96  soft-tissue]
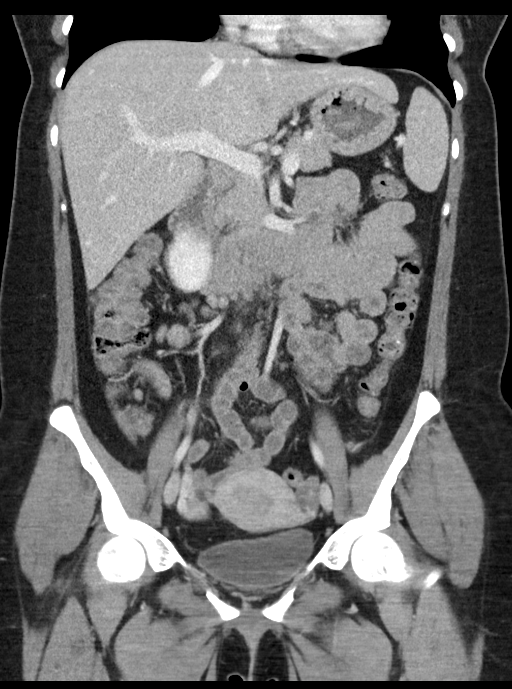
[im 53/96  soft-tissue]
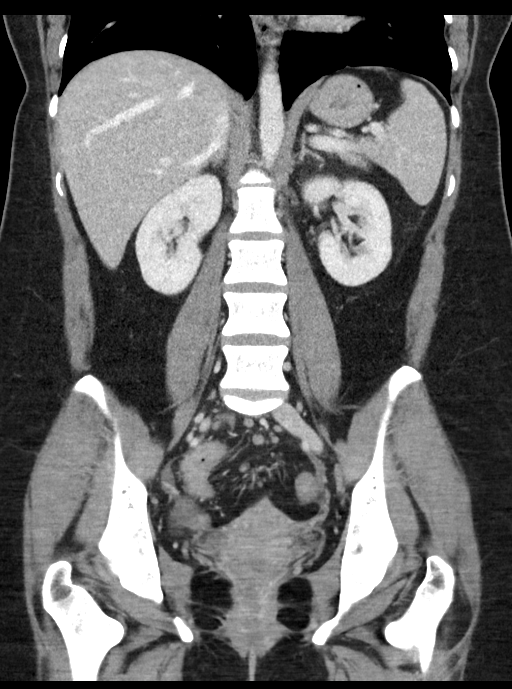

[16 of 46 positions shown; findings below may reference images not displayed]

FINDINGS: Lower chest: No acute abnormality.

Hepatobiliary: No focal liver abnormality is seen. No gallstones,
gallbladder wall thickening, or biliary dilatation.

Pancreas: Unremarkable. No pancreatic ductal dilatation or
surrounding inflammatory changes.

Spleen: Upper normal in size

Adrenals/Urinary Tract: Adrenal glands are unremarkable. Kidneys are
normal, without renal calculi, focal lesion, or hydronephrosis.
Bladder is unremarkable.

Stomach/Bowel: Stomach within normal limits. No dilated small bowel.
Negative appendix. Mild wall thickening and surrounding inflammatory
change involving the rectosigmoid colon.

Vascular/Lymphatic: Nonaneurysmal aorta. Right lower quadrant
mesenteric nodes measuring up to 1 cm.

Reproductive: Uterus unremarkable. 13 mm hyperdense focus in the
right ovary.

Other: Trace free fluid. No free air. Small fat containing umbilical
hernia.

Musculoskeletal: No acute or significant osseous findings.
IMPRESSION: 1. Mild wall thickening and surrounding inflammatory change
involving the rectosigmoid colon consistent with distal colitis and
presumably related to history of inflammatory bowel disease. Trace
free fluid. Negative for free air.
2. 13 mm hyperdense focus in the right ovary, question hemorrhagic
cyst, correlation with nonemergent ultrasound could be considered.

## 2022-05-23 DIAGNOSIS — G4733 Obstructive sleep apnea (adult) (pediatric): Secondary | ICD-10-CM | POA: Diagnosis not present

## 2022-05-28 DIAGNOSIS — Z6831 Body mass index (BMI) 31.0-31.9, adult: Secondary | ICD-10-CM | POA: Diagnosis not present

## 2022-05-28 DIAGNOSIS — R300 Dysuria: Secondary | ICD-10-CM | POA: Diagnosis not present

## 2022-05-28 DIAGNOSIS — B379 Candidiasis, unspecified: Secondary | ICD-10-CM | POA: Diagnosis not present

## 2022-05-28 DIAGNOSIS — R350 Frequency of micturition: Secondary | ICD-10-CM | POA: Diagnosis not present

## 2022-05-28 DIAGNOSIS — T3695XA Adverse effect of unspecified systemic antibiotic, initial encounter: Secondary | ICD-10-CM | POA: Diagnosis not present

## 2022-06-04 ENCOUNTER — Telehealth: Payer: Self-pay | Admitting: Gastroenterology

## 2022-06-04 NOTE — Telephone Encounter (Signed)
Patient is calling wishing to speak with a nurse states it's regarding her appt  would not disclose what exactly. Please advise

## 2022-06-05 NOTE — Telephone Encounter (Signed)
Called and spoke with patient. Pt reports that she did not take her Skyrizi on time because she had the Flu about 1.5 weeks ago and UTI. Patient states that she didn't have any symptoms. Pt completed antibiotics yesterday. Patient has been advised to resume Skyrizi at this time. Patient is aware of her appt next week. Pt verbalized understanding and had no concerns at the end of the call.

## 2022-06-05 NOTE — Telephone Encounter (Signed)
Lm on vm for patient to return call. Pt is due for an office visit in April. Patient has been scheduled for a follow up with Dr. Myrtie Neither on Wednesday, 06/12/22 at 3:40 pm.

## 2022-06-05 NOTE — Telephone Encounter (Signed)
Patient called to return phone call. Informed she is scheduled for 4/24. She is still wanting to speak regarding injections. Please advise.  Thank you.

## 2022-06-07 DIAGNOSIS — G4733 Obstructive sleep apnea (adult) (pediatric): Secondary | ICD-10-CM | POA: Diagnosis not present

## 2022-06-12 ENCOUNTER — Ambulatory Visit: Payer: 59 | Admitting: Gastroenterology

## 2022-06-12 ENCOUNTER — Encounter: Payer: Self-pay | Admitting: Gastroenterology

## 2022-06-12 VITALS — BP 118/78 | HR 90 | Ht 61.0 in | Wt 165.0 lb

## 2022-06-12 DIAGNOSIS — K50811 Crohn's disease of both small and large intestine with rectal bleeding: Secondary | ICD-10-CM

## 2022-06-12 DIAGNOSIS — K5909 Other constipation: Secondary | ICD-10-CM | POA: Diagnosis not present

## 2022-06-12 DIAGNOSIS — Z7960 Long term (current) use of unspecified immunomodulators and immunosuppressants: Secondary | ICD-10-CM | POA: Diagnosis not present

## 2022-06-12 MED ORDER — TRAZODONE HCL 50 MG PO TABS: 50.0000 mg | ORAL_TABLET | Freq: Every day | ORAL | 1 refills | Status: DC

## 2022-06-12 NOTE — Progress Notes (Deleted)
[facility] Progress Note  Chief Complaint: ***  Subjective  History: Last clinic visit December 2023 for Crohn's disease with mild ileitis and more severe proctosigmoiditis.  (The ileitis developed more recently).  No history of perianal disease.  Failed Humira and Entyvio, was doing well on Stelara, then changed to [employer] for insurance reasons.  At last follow-up visit was feeling well, though ESR was elevated at 44 and CRP elevated 15.  So we decided to proceed with colonoscopy, which was done on 02/1722 and showed complete mucosal healing of both the ileal and colonic Crohn's. She also has chronic idiopathic constipation with previously normal anorectal manometry, and takes Amitiza periodically. Previous episode of C. difficile. ______________  ***  ROS: Cardiovascular:  no chest pain Respiratory: no dyspnea  The patient's Past Medical, Family and Social History were reviewed and are on file in the EMR.  Objective:  Med list reviewed  Current Outpatient Medications:    albuterol (VENTOLIN HFA) 108 (90 Base) MCG/ACT inhaler, Inhale into the lungs., Disp: , Rfl:    amitriptyline (ELAVIL) 10 MG tablet, TAKE 1 TABLET BY MOUTH EVERYDAY AT BEDTIME, Disp: 90 tablet, Rfl: 1   cetirizine (ZYRTEC) 10 MG tablet, Take 10 mg by mouth daily., Disp: , Rfl:    clindamycin (CLEOCIN T) 1 % SWAB, Apply 1 Application topically 2 (two) times daily., Disp: , Rfl:    clobetasol cream (TEMOVATE) 0.05 %, Apply 1 Application topically 2 (two) times daily., Disp: , Rfl:    diclofenac Sodium (VOLTAREN) 1 % GEL, Apply 2-3 g topically 4 (four) times daily as needed., Disp: , Rfl:    dicyclomine (BENTYL) 10 MG capsule, TAKE 1 CAPSULE 2-3 TIMES A DAY AS NEEDED FOR ABDOMINAL CRAMPS, Disp: 60 capsule, Rfl: 1   fluticasone-salmeterol (ADVAIR DISKUS) 250-50 MCG/ACT AEPB, INHALE 1 PUFF BY MOUTH INTO THE LUNGS IN THE MORNING AND AT BEDTIME, Disp: 180 each, Rfl: 1   hydrocortisone cream 1 %, Apply to  affected area 2 times daily, Disp: 15 g, Rfl: 0   hydrOXYzine (ATARAX) 25 MG tablet, Take 25 mg by mouth daily., Disp: , Rfl:    linaclotide (LINZESS) 72 MCG capsule, Take 1 capsule (72 mcg total) by mouth daily before breakfast. (Patient not taking: Reported on 03/06/2022), Disp: 90 capsule, Rfl: 1   lubiprostone (AMITIZA) 8 MCG capsule, TAKE 1 CAPSULE (8 MCG TOTAL) BY MOUTH 2 (TWO) TIMES DAILY WITH A MEAL., Disp: 180 capsule, Rfl: 2   mesalamine (ROWASA) 4 g enema, PLACE 60 MLS RECTALLY AT BEDTIME. RETAIN OVERNIGHT IF POSSIBLE (Patient not taking: Reported on 10/15/2021), Disp: 420 mL, Rfl: 1   montelukast (SINGULAIR) 10 MG tablet, Take 10 mg by mouth daily., Disp: , Rfl:    Multiple Vitamin (MULTIVITAMIN) capsule, Take 1 capsule by mouth daily. Immune supplement, Disp: , Rfl:    mupirocin ointment (BACTROBAN) 2 %, Apply 1 Application topically 2 (two) times daily. (Patient not taking: Reported on 10/15/2021), Disp: , Rfl:    omeprazole (PRILOSEC) 40 MG capsule, TAKE 1 CAPSULE (40 MG TOTAL) BY MOUTH DAILY., Disp: 30 capsule, Rfl: 5   ondansetron (ZOFRAN) 4 MG tablet, TAKE 1 TABLET BY MOUTH EVERY 8 HOURS AS NEEDED FOR NAUSEA AND VOMITING, Disp: 60 tablet, Rfl: 1   oxyCODONE-acetaminophen (PERCOCET) 5-325 MG tablet, Take 1-2 tablets by mouth every 6 (six) hours as needed for severe pain., Disp: 20 tablet, Rfl: 0   Risankizumab-rzaa (SKYRIZI) 180 MG/1.2ML SOCT, Inject (1.2 ml) under the skin every 8 weeks,  Disp: 1.2 mL, Rfl: 6   sertraline (ZOLOFT) 50 MG tablet, Take 1 tablet by mouth daily., Disp: , Rfl:    triamcinolone cream (KENALOG) 0.5 %, Apply 1 application  topically 3 (three) times daily., Disp: , Rfl:    Zinc Sulfate (ZINC 15 PO), Take by mouth every morning., Disp: , Rfl:    Vital signs in last 24 hrs: There were no vitals filed for this visit. Wt Readings from Last 3 Encounters:  04/24/22 168 lb 6.4 oz (76.4 kg)  03/06/22 163 lb (73.9 kg)  01/31/22 163 lb (73.9 kg)    Physical  Exam  *** HEENT: sclera anicteric, oral mucosa moist without lesions Neck: supple, no thyromegaly, JVD or lymphadenopathy Cardiac: ***,  no peripheral edema Pulm: clear to auscultation bilaterally, normal RR and effort noted Abdomen: soft, *** tenderness, with active bowel sounds. No guarding or palpable hepatosplenomegaly. Skin; warm and dry, no jaundice or rash  Labs:   ___________________________________________ Radiologic studies:   ____________________________________________ Other:   _____________________________________________ Assessment & Plan  Assessment: No diagnosis found.    Plan:   *** minutes were spent on this encounter (including chart review, history/exam, counseling/coordination of care, and documentation) > 50% of that time was spent on counseling and coordination of care.   [name]

## 2022-06-12 NOTE — Patient Instructions (Signed)
_______________________________________________________  If your blood pressure at your visit was 140/90 or greater, please contact your primary care physician to follow up on this.  _______________________________________________________  If you are age 28 or older, your body mass index should be between 23-30. Your Body mass index is 31.18 kg/m. If this is out of the aforementioned range listed, please consider follow up with your Primary Care Provider.  If you are age 43 or younger, your body mass index should be between 19-25. Your Body mass index is 31.18 kg/m. If this is out of the aformentioned range listed, please consider follow up with your Primary Care Provider.   ________________________________________________________  The [place] [facility] providers would like to encourage you to use [facility] to communicate with providers for non-urgent requests or questions.  Due to long hold times on the telephone, sending your provider a message by [facility] may be a faster and more efficient way to get a response.  Please allow 48 business hours for a response.  Please remember that this is for non-urgent requests.  _______________________________________________________  Follow up 6   It was a pleasure to see you today!  Thank you for trusting me with your gastrointestinal care!

## 2022-06-12 NOTE — Progress Notes (Signed)
[facility] Progress Note  Chief Complaint:  Chief Complaint  Patient presents with   Crohn's Disease    Pt states she has no complaints.    Subjective  History: Last clinic visit December 2023 for Crohn's disease with mild ileitis and more severe proctosigmoiditis.  (The ileitis developed more recently).  No history of perianal disease.  Failed Humira and Entyvio, was doing well on Stelara, then changed to [employer] for insurance reasons.  At last follow-up visit was feeling well, though ESR was elevated at 44 and CRP elevated 15.  So we decided to proceed with colonoscopy, which was done on 02/1722 and showed complete mucosal healing of both the ileal and colonic Crohn's. She also has chronic idiopathic constipation with previously normal anorectal manometry, and takes Amitiza periodically. Previous episode of C. difficile. ______________  Today, she reports feeling well overall, she doesn't have any GI concerns. She reports good tolerance of Skyrizi 180 mg despite feeling pain when getting the injections.  She reports having an episode of a viral sickness few weeks ago where she had the flu and eventually had a UTI but has since been resolved.   She also reports getting diagnosed with obstructive sleep apnea since her last visit and was told that she has to lose some weight per the recommendation of her pulmonologist   She states that she stopped taking her Amitriptyline 10 mg per her pulmonologist recommendation because they were apparently concerned it may have contributed to weight gain. Marissa Morales continues to have intermittent constipation that she treats with Amitiza.  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  She is very happy to be feeling physically as well as she has in years.  ROS: Review of Systems  Constitutional:  Negative for appetite change and fever.  HENT:  Negative for trouble swallowing.   Respiratory:   Negative for cough and shortness of breath.   Cardiovascular:  Negative for chest pain.  Gastrointestinal:  Negative for abdominal distention, abdominal pain, anal bleeding, blood in stool, constipation, diarrhea, nausea, rectal pain and vomiting.  Genitourinary:  Negative for dysuria.  Musculoskeletal:  Negative for back pain.  Skin:  Negative for rash.  Neurological:  Negative for weakness.  Psychiatric/Behavioral:  Positive for sleep disturbance.   All other systems reviewed and are negative.    The patient's Past Medical, Family and Social History were reviewed and are on file in the EMR.  Objective:  Med list reviewed  Current Outpatient Medications:    albuterol (VENTOLIN HFA) 108 (90 Base) MCG/ACT inhaler, Inhale into the lungs., Disp: , Rfl:    amitriptyline (ELAVIL) 10 MG tablet, TAKE 1 TABLET BY MOUTH EVERYDAY AT BEDTIME, Disp: 90 tablet, Rfl: 1   cetirizine (ZYRTEC) 10 MG tablet, Take 10 mg by mouth daily., Disp: , Rfl:    clindamycin (CLEOCIN T) 1 % SWAB, Apply 1 Application topically 2 (two) times daily., Disp: , Rfl:    clobetasol cream (TEMOVATE) 0.05 %, Apply 1 Application topically 2 (two) times daily., Disp: , Rfl:    diclofenac Sodium (VOLTAREN) 1 % GEL, Apply 2-3 g topically 4 (four) times daily as needed., Disp: , Rfl:    dicyclomine (BENTYL) 10 MG capsule, TAKE 1 CAPSULE 2-3 TIMES A DAY AS NEEDED FOR ABDOMINAL CRAMPS, Disp: 60 capsule, Rfl: 1   fluticasone-salmeterol (ADVAIR DISKUS) 250-50 MCG/ACT AEPB, INHALE 1 PUFF BY MOUTH INTO THE LUNGS IN THE MORNING AND  AT BEDTIME, Disp: 180 each, Rfl: 1   hydrocortisone cream 1 %, Apply to affected area 2 times daily, Disp: 15 g, Rfl: 0   hydrOXYzine (ATARAX) 25 MG tablet, Take 25 mg by mouth daily., Disp: , Rfl:    linaclotide (LINZESS) 72 MCG capsule, Take 1 capsule (72 mcg total) by mouth daily before breakfast. (Patient not taking: Reported on 03/06/2022), Disp: 90 capsule, Rfl: 1   lubiprostone (AMITIZA) 8 MCG capsule,  TAKE 1 CAPSULE (8 MCG TOTAL) BY MOUTH 2 (TWO) TIMES DAILY WITH A MEAL., Disp: 180 capsule, Rfl: 2   mesalamine (ROWASA) 4 g enema, PLACE 60 MLS RECTALLY AT BEDTIME. RETAIN OVERNIGHT IF POSSIBLE (Patient not taking: Reported on 10/15/2021), Disp: 420 mL, Rfl: 1   montelukast (SINGULAIR) 10 MG tablet, Take 10 mg by mouth daily., Disp: , Rfl:    Multiple Vitamin (MULTIVITAMIN) capsule, Take 1 capsule by mouth daily. Immune supplement, Disp: , Rfl:    mupirocin ointment (BACTROBAN) 2 %, Apply 1 Application topically 2 (two) times daily. (Patient not taking: Reported on 10/15/2021), Disp: , Rfl:    omeprazole (PRILOSEC) 40 MG capsule, TAKE 1 CAPSULE (40 MG TOTAL) BY MOUTH DAILY., Disp: 30 capsule, Rfl: 5   ondansetron (ZOFRAN) 4 MG tablet, TAKE 1 TABLET BY MOUTH EVERY 8 HOURS AS NEEDED FOR NAUSEA AND VOMITING, Disp: 60 tablet, Rfl: 1   oxyCODONE-acetaminophen (PERCOCET) 5-325 MG tablet, Take 1-2 tablets by mouth every 6 (six) hours as needed for severe pain., Disp: 20 tablet, Rfl: 0   Risankizumab-rzaa (SKYRIZI) 180 MG/1.2ML SOCT, Inject (1.2 ml) under the skin every 8 weeks, Disp: 1.2 mL, Rfl: 6   sertraline (ZOLOFT) 50 MG tablet, Take 1 tablet by mouth daily., Disp: , Rfl:    triamcinolone cream (KENALOG) 0.5 %, Apply 1 application  topically 3 (three) times daily., Disp: , Rfl:    Zinc Sulfate (ZINC 15 PO), Take by mouth every morning., Disp: , Rfl:    Vital signs in last 24 hrs: There were no vitals filed for this visit. Wt Readings from Last 3 Encounters:  04/24/22 168 lb 6.4 oz (76.4 kg)  03/06/22 163 lb (73.9 kg)  01/31/22 163 lb (73.9 kg)    Physical Exam  General: well-appearing   Eyes: sclera anicteric, no redness ENT: oral mucosa moist without lesions, no cervical or supraclavicular lymphadenopathy CV: RRR, no JVD, no peripheral edema Resp: clear to auscultation bilaterally, normal RR and effort noted GI: soft, no tenderness, with active bowel sounds. No guarding or palpable  organomegaly noted. Skin; warm and dry, no rash or jaundice noted Neuro: awake, alert and oriented x 3. Normal gross motor function and fluent speech   Labs:   ___________________________________________ Radiologic studies:   ____________________________________________ Other:   _____________________________________________ Assessment & Plan  Assessment: Crohn's disease of both small and large intestine with rectal bleeding  Long-term use of immunosuppressant medication  Chronic constipation  Crohn's currently under very good control.  Tolerating Skyrizi well, though she has some brief burning and pain at the injection sites without rash.   Plan: I gave her a small supply of trazodone 50 mg tablets with instructions to start with just a half a tablet at bedtime.  Month supply with advised to see her primary care for further management of this going forward.  If she continues to feel well I will plan to see her in 6 months.   [name]   I,Safa M Kadhim,acting as a scribe for [employer] III,  MD.,have documented all relevant documentation on the behalf of Sherrilyn Rist, MD,as directed by  Sherrilyn Rist, MD while in the presence of Sherrilyn Rist, MD.   Marvis Repress III, MD, have reviewed all documentation for this visit. The documentation on 06/12/22 for the exam, diagnosis, procedures, and orders are all accurate and complete.

## 2022-06-22 DIAGNOSIS — G4733 Obstructive sleep apnea (adult) (pediatric): Secondary | ICD-10-CM | POA: Diagnosis not present

## 2022-06-24 ENCOUNTER — Encounter: Payer: Self-pay | Admitting: Gastroenterology

## 2022-06-25 ENCOUNTER — Ambulatory Visit: Payer: 59 | Admitting: Gastroenterology

## 2022-06-25 ENCOUNTER — Encounter: Payer: Self-pay | Admitting: Gastroenterology

## 2022-06-25 VITALS — BP 124/68 | HR 91 | Ht 61.0 in | Wt 164.2 lb

## 2022-06-25 DIAGNOSIS — R1032 Left lower quadrant pain: Secondary | ICD-10-CM

## 2022-06-25 DIAGNOSIS — R197 Diarrhea, unspecified: Secondary | ICD-10-CM | POA: Diagnosis not present

## 2022-06-25 DIAGNOSIS — K50811 Crohn's disease of both small and large intestine with rectal bleeding: Secondary | ICD-10-CM

## 2022-06-25 NOTE — Telephone Encounter (Signed)
If she can get a ride to/from the office this afternoon, I could see her for a 3:40 appointment and do a C diff rectal swab at that time. It's a work-in, so she may have to wait depending on how my clinic is running.  - HD

## 2022-06-25 NOTE — Progress Notes (Signed)
Marissa Morales  Chief Complaint: Acute diarrhea  Subjective  History: Marissa Morales called [place] this morning complaining of a few days of abdominal pain nausea and diarrhea that she felt was reminiscent of her previous C. difficile infection.  She was recently treated with Macrobid for UTI prior to symptom onset.  I last saw her in the office recently (06/12/2022), at which time her ileocolonic Crohn's disease was doing very well on Skyrizi with complete mucosal remission on colonoscopy 03/06/2022.  [name] finished antibiotics perhaps 3 weeks ago, then about a week ago began having crampy mid to lower abdominal pain, bloating and several loose stools per day with intermittent scant rectal bleeding.  She feels the symptoms are reminiscent of when she had C. difficile, and is of course hoping very much this is not a flare of her Crohn's disease.  ROS: Cardiovascular:  no chest pain Respiratory: no dyspnea  The patient's Past Medical, Family and Social History were reviewed and are on file in the EMR.  Objective:  Med list reviewed  Current Outpatient Medications:    albuterol (VENTOLIN HFA) 108 (90 Base) MCG/ACT inhaler, Inhale into the lungs., Disp: , Rfl:    cetirizine (ZYRTEC) 10 MG tablet, Take 10 mg by mouth daily., Disp: , Rfl:    clindamycin (CLEOCIN T) 1 % SWAB, Apply 1 Application topically 2 (two) times daily., Disp: , Rfl:    clobetasol cream (TEMOVATE) 0.05 %, Apply 1 Application topically 2 (two) times daily., Disp: , Rfl:    diclofenac Sodium (VOLTAREN) 1 % GEL, Apply 2-3 g topically 4 (four) times daily as needed., Disp: , Rfl:    dicyclomine (BENTYL) 10 MG capsule, TAKE 1 CAPSULE 2-3 TIMES A DAY AS NEEDED FOR ABDOMINAL CRAMPS, Disp: 60 capsule, Rfl: 1   fluticasone-salmeterol (ADVAIR DISKUS) 250-50 MCG/ACT AEPB, INHALE 1 PUFF BY MOUTH INTO THE LUNGS IN THE MORNING AND AT BEDTIME, Disp: 180 each, Rfl: 1   hydrocortisone cream 1 %, Apply to affected area 2 times  daily, Disp: 15 g, Rfl: 0   hydrOXYzine (ATARAX) 25 MG tablet, Take 25 mg by mouth daily., Disp: , Rfl:    linaclotide (LINZESS) 72 MCG capsule, Take 1 capsule (72 mcg total) by mouth daily before breakfast., Disp: 90 capsule, Rfl: 1   lubiprostone (AMITIZA) 8 MCG capsule, TAKE 1 CAPSULE (8 MCG TOTAL) BY MOUTH 2 (TWO) TIMES DAILY WITH A MEAL., Disp: 180 capsule, Rfl: 2   mesalamine (ROWASA) 4 g enema, PLACE 60 MLS RECTALLY AT BEDTIME. RETAIN OVERNIGHT IF POSSIBLE, Disp: 420 mL, Rfl: 1   montelukast (SINGULAIR) 10 MG tablet, Take 10 mg by mouth daily., Disp: , Rfl:    Multiple Vitamin (MULTIVITAMIN) capsule, Take 1 capsule by mouth daily. Immune supplement, Disp: , Rfl:    mupirocin ointment (BACTROBAN) 2 %, Apply 1 Application topically 2 (two) times daily., Disp: , Rfl:    omeprazole (PRILOSEC) 40 MG capsule, TAKE 1 CAPSULE (40 MG TOTAL) BY MOUTH DAILY., Disp: 30 capsule, Rfl: 5   ondansetron (ZOFRAN) 4 MG tablet, TAKE 1 TABLET BY MOUTH EVERY 8 HOURS AS NEEDED FOR NAUSEA AND VOMITING, Disp: 60 tablet, Rfl: 1   oxyCODONE-acetaminophen (PERCOCET) 5-325 MG tablet, Take 1-2 tablets by mouth every 6 (six) hours as needed for severe pain., Disp: 20 tablet, Rfl: 0   Risankizumab-rzaa (SKYRIZI) 180 MG/1.2ML SOCT, Inject (1.2 ml) under the skin every 8 weeks, Disp: 1.2 mL, Rfl: 6   sertraline (ZOLOFT) 50 MG tablet, Take 1 tablet  by mouth daily., Disp: , Rfl:    traZODone (DESYREL) 50 MG tablet, Take 1 tablet (50 mg total) by mouth at bedtime., Disp: 30 tablet, Rfl: 1   triamcinolone cream (KENALOG) 0.5 %, Apply 1 application  topically 3 (three) times daily., Disp: , Rfl:    Zinc Sulfate (ZINC 15 PO), Take by mouth every morning., Disp: , Rfl:    Vital signs in last 24 hrs: Vitals:   06/25/22 1547  BP: 124/68  Pulse: 91  SpO2: 98%   Wt Readings from Last 3 Encounters:  06/25/22 164 lb 3.2 oz (74.5 kg)  06/12/22 165 lb (74.8 kg)  04/24/22 168 lb 6.4 oz (76.4 kg)    Physical Exam  She is not  acutely ill-appearing Appears well-hydrated Cardiac: Regular without appreciable murmur,  no peripheral edema Pulm: clear to auscultation bilaterally, normal RR and effort noted Abdomen: soft, mild LLQ tenderness, with active bowel sounds. No guarding or palpable hepatosplenomegaly. Skin; warm and dry, no jaundice or rash Perianal exam normal.  Small amount of lubrication at the anal opening, then rectal swab placed and sample taken for GI pathogen panel ( Jan Hogan, CMA present for exam) Labs:   ___________________________________________ Radiologic studies:   ____________________________________________ Other:   _____________________________________________ Assessment & Plan  Assessment: Encounter Diagnoses  Name Primary?   Diarrhea of presumed infectious origin Yes   LLQ abdominal pain    Crohn's disease of both small and large intestine with rectal bleeding (HCC)    Crohn's was recently under good control both symptomatically and endoscopically on Skyrizi.  Acute onset lower abdominal pain diarrhea nausea about a week ago after having had antibiotics for UTI.  She also has a previous history of C. difficile.  This seems most likely to be an acute infectious case. Plan: Rectal swab sent for [facility] GI pathogen panel.  Should have results back in the next 2 to 3 days.  Meanwhile, stay well-hydrated and further plans to follow test results. \  [name]

## 2022-06-25 NOTE — Patient Instructions (Addendum)
Your provider has ordered "[facility]" stool testing for you. You have received a kit from our office today containing all necessary supplies to complete this test. Please carefully read the stool collection instructions provided in the kit before opening the accompanying materials. In addition, be sure to place the label from the top left corner of the laboratory request sheet onto the "puritan opti-swab" tube that is supplied in the kit. This label should include your full name and date of birth. After completing the test, you should secure the purtian tube into the specimen biohazard bag. The laboratory request information sheet (including date and time of specimen collection) should be placed into the outside pocket of the specimen biohazard bag and returned to the [place] lab with 2 days of collection.    It was a pleasure to see you today!  Thank you for trusting me with your gastrointestinal care!     If your blood pressure at your visit was 140/90 or greater, please contact your primary care physician to follow up on this. ______________________________________________________  If you are age 3 or older, your body mass index should be between 23-30. Your Body mass index is 31.03 kg/m. If this is out of the aforementioned range listed, please consider follow up with your Primary Care Provider.  If you are age 59 or younger, your body mass index should be between 19-25. Your Body mass index is 31.03 kg/m. If this is out of the aformentioned range listed, please consider follow up with your Primary Care Provider.  ________________________________________________________  The [place] [facility] providers would like to encourage you to use [facility] to communicate with providers for non-urgent requests or questions.  Due to long hold times on the telephone, sending your provider a message by [facility] may be a faster and more efficient way to get a response.  Please allow 48 business hours for a response.   Please remember that this is for non-urgent requests.  _______________________________________________________  Due to recent changes in healthcare laws, you may see the results of your imaging and laboratory studies on MyChart before your provider has had a chance to review them.  We understand that in some cases there may be results that are confusing or concerning to you. Not all laboratory results come back in the same time frame and the provider may be waiting for multiple results in order to interpret others.  Please give [place] 48 hours in order for your provider to thoroughly review all the results before contacting the office for clarification of your results.

## 2022-06-27 ENCOUNTER — Encounter: Payer: Self-pay | Admitting: Gastroenterology

## 2022-06-28 ENCOUNTER — Other Ambulatory Visit: Payer: Self-pay | Admitting: Gastroenterology

## 2022-07-01 ENCOUNTER — Telehealth: Payer: Self-pay

## 2022-07-01 ENCOUNTER — Other Ambulatory Visit (HOSPITAL_COMMUNITY): Payer: Self-pay

## 2022-07-01 NOTE — Telephone Encounter (Signed)
PA request received via provider for Omeprazole 40MG  dr capsules  PA submitted to [employer] via CMM   Key: [id number]

## 2022-07-01 NOTE — Telephone Encounter (Signed)
PA submitted, will be updated in additional encounter created.  

## 2022-07-02 ENCOUNTER — Other Ambulatory Visit: Payer: Self-pay

## 2022-07-02 MED ORDER — OMEPRAZOLE 40 MG PO CPDR: 40.0000 mg | DELAYED_RELEASE_CAPSULE | Freq: Every day | ORAL | 5 refills | Status: DC

## 2022-07-04 ENCOUNTER — Other Ambulatory Visit: Payer: Self-pay | Admitting: Gastroenterology

## 2022-07-04 ENCOUNTER — Other Ambulatory Visit (HOSPITAL_COMMUNITY): Payer: Self-pay

## 2022-07-04 NOTE — Telephone Encounter (Signed)
PA is not needed °

## 2022-07-07 DIAGNOSIS — G4733 Obstructive sleep apnea (adult) (pediatric): Secondary | ICD-10-CM | POA: Diagnosis not present

## 2022-07-10 ENCOUNTER — Other Ambulatory Visit: Payer: Self-pay

## 2022-07-10 MED ORDER — OMEPRAZOLE 40 MG PO CPDR: 40.0000 mg | DELAYED_RELEASE_CAPSULE | Freq: Every day | ORAL | 1 refills | Status: DC

## 2022-07-18 ENCOUNTER — Encounter: Payer: Self-pay | Admitting: Gastroenterology

## 2022-08-07 DIAGNOSIS — G4733 Obstructive sleep apnea (adult) (pediatric): Secondary | ICD-10-CM | POA: Diagnosis not present

## 2022-08-07 NOTE — Telephone Encounter (Signed)
Please sign this encounter. 

## 2022-08-21 ENCOUNTER — Ambulatory Visit: Payer: Self-pay | Admitting: Surgery

## 2022-08-21 DIAGNOSIS — L723 Sebaceous cyst: Secondary | ICD-10-CM | POA: Diagnosis not present

## 2022-08-21 NOTE — H&P (View-Only) (Signed)
Marissa Morales [id number]   Referring Provider:  Provider   Subjective   Chief Complaint: New Consultation ( Cyst to middle of back)     History of Present Illness:    27-year-old with history of allergy, anemia, anxiety, asthma, depression, GERD, hemorrhoids, Crohn's disease on skyrizi, pilonidal cyst excision (2019), sleep apnea with procedural history of pilonidal cyst excision, anal rectal manometry, colonoscopy and EGD, and knee arthroscopy who self refers for evaluation of a cyst in the middle of her back which has been present for some time now, has been lanced twice in the past as well as treated with multiple different antibiotics and topical remedies without resolution.  She does have a history of cystic acne in this area as well.   Review of Systems: A complete review of systems was obtained from the patient.  I have reviewed this information and discussed as appropriate with the patient.  See HPI as well for other ROS.   Medical History: Past Medical History:  Diagnosis Date   Anemia    Anxiety    Asthma, unspecified asthma severity, unspecified whether complicated, unspecified whether persistent (HHS-HCC)    GERD (gastroesophageal reflux disease)    Sleep apnea     There is no problem list on file for this patient.   Past Surgical History:  Procedure Laterality Date   CYSTECTOMY     Knee surgery     wisdom teeth       Allergies  Allergen Reactions   Doxycycline Vomiting   Pepto-Bismol [Bismuth Subsalicylate] Vomiting   Vancomycin Other (See Comments)    Red Man Syndrome   Vantin [Cefpodoxime] Unknown    Childhood    Current Outpatient Medications on File Prior to Visit  Medication Sig Dispense Refill   albuterol MDI, PROVENTIL, VENTOLIN, PROAIR, HFA 90 mcg/actuation inhaler INHALE 1 PUFF INTO THE LUNGS EVERY 4 HOURS AS NEEDED FOR 30 DAYS     cetirizine (ZYRTEC) 10 MG tablet Take 10 mg by mouth once daily     lubiprostone (AMITIZA) 8 MCG capsule  Take by mouth     montelukast (SINGULAIR) 10 mg tablet Take 10 mg by mouth once daily     omeprazole (PRILOSEC) 40 MG DR capsule Take 40 mg by mouth once daily     oxyCODONE-acetaminophen (PERCOCET) 5-325 mg tablet Take by mouth     sertraline (ZOLOFT) 50 MG tablet Take 1 tablet by mouth once daily     SKYRIZI 180 mg/1.2 mL (150 mg/mL) Injt Inject (1.2 ml) under the skin every 8 weeks     traZODone (DESYREL) 50 MG tablet Take 1 tablet by mouth at bedtime     WIXELA INHUB 250-50 mcg/dose diskus inhaler INHALE 1 PUFF BY MOUTH INTO THE LUNGS IN THE MORNING AND AT BEDTIME     No current facility-administered medications on file prior to visit.    Family History  Problem Relation Age of Onset   Stroke Mother    Obesity Mother    High blood pressure (Hypertension) Mother    Hyperlipidemia (Elevated cholesterol) Mother    Stroke Paternal Uncle    Heart valve disease Paternal Grandfather      Social History   Tobacco Use  Smoking Status Former   Types: Cigarettes  Smokeless Tobacco Not on file     Social History   Socioeconomic History   Marital status: Single  Tobacco Use   Smoking status: Former    Types: Cigarettes  Vaping Use   Vaping status:   Never Used  Substance and Sexual Activity   Alcohol use: Not Currently   Drug use: Never   Social Determinants of Health   Financial Resource Strain: Low Risk  (11/06/2020)   Received from [facility]   Overall Financial Resource Strain (CARDIA)    Difficulty of Paying Living Expenses: Not very hard  Food Insecurity: Food Insecurity Present (11/06/2020)   Received from [facility]   Hunger Vital Sign    Worried About Running Out of Food in the Last Year: Sometimes true    Ran Out of Food in the Last Year: Sometimes true  Transportation Needs: Unmet Transportation Needs (11/06/2020)   Received from [facility]   PRAPARE - Transportation    Lack of Transportation (Medical): Yes    Lack of Transportation (Non-Medical): Yes   Physical Activity: Sufficiently Active (11/06/2020)   Received from [facility]   Exercise Vital Sign    Days of Exercise per Week: 5 days    Minutes of Exercise per Session: 60 min  Stress: No Stress Concern Present (11/06/2020)   Received from [facility]   [employer] - Occupational Stress Questionnaire    Feeling of Stress : Only a little   Received from [facility]   Social Network    Objective:    Vitals:   08/21/22 1336  BP: 125/86  Pulse: 94  Temp: 36.9 C (98.4 F)  SpO2: 98%  Weight: 74.5 kg (164 lb 3.2 oz)  Height: 154.9 cm (5' 1")  PainSc:   3    Body mass index is 31.03 kg/m.  Gen: A&Ox3, no distress  On the upper back essentially in the midline is an approximately 1 cm chronic subcutaneous cyst with a central pore, some overlying erythema  Assessment and Plan:  Diagnoses and all orders for this visit:  Sebaceous cyst     Desires excision.  I discussed the procedure with her including risks of bleeding, infection, pain, scarring, wound healing problems or undesired cosmetic result, recurrence.  Questions welcomed and answered to her satisfaction.    Askia Hazelip AMANDA Charman Blasco, MD   

## 2022-08-21 NOTE — H&P (Signed)
Marissa Morales [id number]   Referring Provider:  Provider   Subjective   Chief Complaint: New Consultation ( Cyst to middle of back)     History of Present Illness:    27 year old with history of allergy, anemia, anxiety, asthma, depression, GERD, hemorrhoids, Crohn's disease on skyrizi, pilonidal cyst excision (2019), sleep apnea with procedural history of pilonidal cyst excision, anal rectal manometry, colonoscopy and EGD, and knee arthroscopy who self refers for evaluation of a cyst in the middle of her back which has been present for some time now, has been lanced twice in the past as well as treated with multiple different antibiotics and topical remedies without resolution.  She does have a history of cystic acne in this area as well.   Review of Systems: A complete review of systems was obtained from the patient.  I have reviewed this information and discussed as appropriate with the patient.  See HPI as well for other ROS.   Medical History: Past Medical History:  Diagnosis Date   Anemia    Anxiety    Asthma, unspecified asthma severity, unspecified whether complicated, unspecified whether persistent (HHS-HCC)    GERD (gastroesophageal reflux disease)    Sleep apnea     There is no problem list on file for this patient.   Past Surgical History:  Procedure Laterality Date   CYSTECTOMY     Knee surgery     wisdom teeth       Allergies  Allergen Reactions   Doxycycline Vomiting   Pepto-Bismol [Bismuth Subsalicylate] Vomiting   Vancomycin Other (See Comments)    Red Man Syndrome   Vantin [Cefpodoxime] Unknown    Childhood    Current Outpatient Medications on File Prior to Visit  Medication Sig Dispense Refill   albuterol MDI, PROVENTIL, VENTOLIN, PROAIR, HFA 90 mcg/actuation inhaler INHALE 1 PUFF INTO THE LUNGS EVERY 4 HOURS AS NEEDED FOR 30 DAYS     cetirizine (ZYRTEC) 10 MG tablet Take 10 mg by mouth once daily     lubiprostone (AMITIZA) 8 MCG capsule  Take by mouth     montelukast (SINGULAIR) 10 mg tablet Take 10 mg by mouth once daily     omeprazole (PRILOSEC) 40 MG DR capsule Take 40 mg by mouth once daily     oxyCODONE-acetaminophen (PERCOCET) 5-325 mg tablet Take by mouth     sertraline (ZOLOFT) 50 MG tablet Take 1 tablet by mouth once daily     SKYRIZI 180 mg/1.2 mL (150 mg/mL) Injt Inject (1.2 ml) under the skin every 8 weeks     traZODone (DESYREL) 50 MG tablet Take 1 tablet by mouth at bedtime     WIXELA INHUB 250-50 mcg/dose diskus inhaler INHALE 1 PUFF BY MOUTH INTO THE LUNGS IN THE MORNING AND AT BEDTIME     No current facility-administered medications on file prior to visit.    Family History  Problem Relation Age of Onset   Stroke Mother    Obesity Mother    High blood pressure (Hypertension) Mother    Hyperlipidemia (Elevated cholesterol) Mother    Stroke Paternal Uncle    Heart valve disease Paternal Grandfather      Social History   Tobacco Use  Smoking Status Former   Types: Cigarettes  Smokeless Tobacco Not on file     Social History   Socioeconomic History   Marital status: Single  Tobacco Use   Smoking status: Former    Types: Cigarettes  Vaping Use   Vaping status:  Never Used  Substance and Sexual Activity   Alcohol use: Not Currently   Drug use: Never   Social Determinants of Health   Financial Resource Strain: Low Risk  (11/06/2020)   Received from [facility]   Overall Financial Resource Strain (CARDIA)    Difficulty of Paying Living Expenses: Not very hard  Food Insecurity: Food Insecurity Present (11/06/2020)   Received from [facility]   Hunger Vital Sign    Worried About Running Out of Food in the Last Year: Sometimes true    Ran Out of Food in the Last Year: Sometimes true  Transportation Needs: Unmet Transportation Needs (11/06/2020)   Received from [facility] - Transportation    Lack of Transportation (Medical): Yes    Lack of Transportation (Non-Medical): Yes   Physical Activity: Sufficiently Active (11/06/2020)   Received from [facility]   Exercise Vital Sign    Days of Exercise per Week: 5 days    Minutes of Exercise per Session: 60 min  Stress: No Stress Concern Present (11/06/2020)   Received from [facility] - Occupational Stress Questionnaire    Feeling of Stress : Only a little   Received from [employer]   Social Network    Objective:    Vitals:   08/21/22 1336  BP: 125/86  Pulse: 94  Temp: 36.9 C (98.4 F)  SpO2: 98%  Weight: 74.5 kg (164 lb 3.2 oz)  Height: 154.9 cm (5\' 1" )  PainSc:   3    Body mass index is 31.03 kg/m.  Gen: A&Ox3, no distress  On the upper back essentially in the midline is an approximately 1 cm chronic subcutaneous cyst with a central pore, some overlying erythema  Assessment and Plan:  Diagnoses and all orders for this visit:  Sebaceous cyst     Desires excision.  I discussed the procedure with her including risks of bleeding, infection, pain, scarring, wound healing problems or undesired cosmetic result, recurrence.  Questions welcomed and answered to her satisfaction.    Jonathen Rathman Carlye Grippe, MD

## 2022-08-25 NOTE — Patient Instructions (Signed)
SURGICAL WAITING ROOM VISITATION Patients having surgery or a procedure may have no more than 2 support people in the waiting area - these visitors may rotate in the visitor waiting room.   Due to an increase in RSV and influenza rates and associated hospitalizations, children ages 67 and under may not visit patients in [facility] hospitals. If the patient needs to stay at the hospital during part of their recovery, the visitor guidelines for inpatient rooms apply.  PRE-OP VISITATION  Pre-op nurse will coordinate an appropriate time for 1 support person to accompany the patient in pre-op.  This support person may not rotate.  This visitor will be contacted when the time is appropriate for the visitor to come back in the pre-op area.  Please refer to the [facility] website for the visitor guidelines for Inpatients (after your surgery is over and you are in a regular room).  You are not required to quarantine at this time prior to your surgery. However, you must do this: Hand Hygiene often Do NOT share personal items Notify your provider if you are in close contact with someone who has COVID or you develop fever 100.4 or greater, new onset of sneezing, cough, sore throat, shortness of breath or body aches.  If you test positive for Covid or have been in contact with anyone that has tested positive in the last 10 days please notify you surgeon.    Your procedure is scheduled on:  Wednesday  August 28, 2022  Report to [facility] Main Entrance: [address] entrance where the [employer] is available.   Report to admitting at:  06:15   AM  Call this number if you have any questions or problems the morning of surgery [phone]  DO NOT EAT OR DRINK ANYTHING AFTER MIDNIGHT THE NIGHT PRIOR TO YOUR SURGERY / PROCEDURE.   FOLLOW BOWEL PREP AND ANY ADDITIONAL PRE OP INSTRUCTIONS YOU RECEIVED FROM YOUR SURGEON'S OFFICE!!!   Oral Hygiene is also important to reduce your risk of infection.         Remember - BRUSH YOUR TEETH THE MORNING OF SURGERY WITH YOUR REGULAR TOOTHPASTE  Do NOT smoke after Midnight the night before surgery.  Take ONLY these medicines the morning of surgery with A SIP OF WATER: omeprazole, cetirizine (Zyrtec), sertraline (Zoloft)   If You have been diagnosed with Sleep Apnea - Bring CPAP mask and tubing day of surgery. We will provide you with a CPAP machine on the day of your surgery.                   You may not have any metal on your body including hair pins, jewelry, and body piercing  Do not wear make-up, lotions, powders, perfumes or deodorant  Do not wear nail polish including gel and S&S, artificial / acrylic nails, or any other type of covering on natural nails including finger and toenails. If you have artificial nails, gel coating, etc., that needs to be removed by a nail salon, Please have this removed prior to surgery. Not doing so may mean that your surgery could be cancelled or delayed if the Surgeon or anesthesia staff feels like they are unable to monitor you safely.   Do not shave 48 hours prior to surgery to avoid nicks in your skin which may contribute to postoperative infections.   Contacts, Hearing Aids, dentures or bridgework may not be worn into surgery. DENTURES WILL BE REMOVED PRIOR TO SURGERY PLEASE DO NOT APPLY "  Poly grip" OR ADHESIVES!!!  Patients discharged on the day of surgery will not be allowed to drive home.  Someone NEEDS to stay with you for the first 24 hours after anesthesia.  Do not bring your home medications to the hospital. The Pharmacy will dispense medications listed on your medication list to you during your admission in the Hospital.  Please read over the following fact sheets you were given: IF YOU HAVE QUESTIONS ABOUT YOUR PRE-OP INSTRUCTIONS, PLEASE CALL [phone].   [place] - Preparing for Surgery Before surgery, you can play an important role.  Because skin is not sterile, your skin needs to be  as free of germs as possible.  You can reduce the number of germs on your skin by washing with CHG (chlorahexidine gluconate) soap before surgery.  CHG is an antiseptic cleaner which kills germs and bonds with the skin to continue killing germs even after washing. Please DO NOT use if you have an allergy to CHG or antibacterial soaps.  If your skin becomes reddened/irritated stop using the CHG and inform your nurse when you arrive at Short Stay. Do not shave (including legs and underarms) for at least 48 hours prior to the first CHG shower.  You may shave your face/neck.  Please follow these instructions carefully:  1.  Shower with CHG Soap the night before surgery and the  morning of surgery.  2.  If you choose to wash your hair, wash your hair first as usual with your normal  shampoo.  3.  After you shampoo, rinse your hair and body thoroughly to remove the shampoo.                             4.  Use CHG as you would any other liquid soap.  You can apply chg directly to the skin and wash.  Gently with a scrungie or clean washcloth.  5.  Apply the CHG Soap to your body ONLY FROM THE NECK DOWN.   Do not use on face/ open                           Wound or open sores. Avoid contact with eyes, ears mouth and genitals (private parts).                       Wash face,  Genitals (private parts) with your normal soap.             6.  Wash thoroughly, paying special attention to the area where your  surgery  will be performed.  7.  Thoroughly rinse your body with warm water from the neck down.  8.  DO NOT shower/wash with your normal soap after using and rinsing off the CHG Soap.            9.  Pat yourself dry with a clean towel.            10.  Wear clean pajamas.            11.  Place clean sheets on your bed the night of your first shower and do not  sleep with pets.  ON THE DAY OF SURGERY : Do not apply any lotions/deodorants the morning of surgery.  Please wear clean clothes to the  hospital/surgery center.    FAILURE TO FOLLOW THESE INSTRUCTIONS MAY RESULT IN THE CANCELLATION OF YOUR  SURGERY  PATIENT SIGNATURE_________________________________  NURSE SIGNATURE__________________________________  ________________________________________________________________________

## 2022-08-25 NOTE — Progress Notes (Signed)
COVID Vaccine received:  []  No [x]  Yes Date of any COVID positive Test in last 90 days:  PCP - Kae Heller, FNP Cardiologist - none Pulmonary- Durel Salts, MD   Chest x-ray -  EKG -  no hx to warrant Stress Test -  ECHO -  Cardiac Cath -   PCR screen: []  Ordered & Completed           []   No Order but Needs PROFEND           [x]   N/A for this surgery  Surgery Plan:  [x]  Ambulatory                            []  Outpatient in bed                            []  Admit  Anesthesia:    []  General  []  Spinal                           []   Choice [x]   MAC   Pacemaker / ICD device [x]  No []  Yes   Spinal Cord Stimulator:[x]  No []  Yes       History of Sleep Apnea? []  No [x]  Yes   CPAP used?- []  No [x]  Yes    Does the patient monitor blood sugar?          []  No []  Yes  [x]  N/A  Patient has: [x]  NO Hx DM   []  Pre-DM                 []  DM1  []   DM2  Blood Thinner / Instructions: none Aspirin Instructions: none  ERAS Protocol Ordered: []  No  [x]  Yes on Booking but NPO in orders Patient is to be NPO after: Midnight prior  Comments:   Activity level: Patient is able / unable to climb a flight of stairs without difficulty; []  No CP  []  No SOB, but would have ___   Patient can / can not perform ADLs without assistance.   Anesthesia review: OSA-CPAP, panic attacks-anxiety, Crohn's small and large bowel, GERD, anemia, Long term use of Immunosuppressive meds  Patient denies shortness of breath, fever, cough and chest pain at PAT appointment.  Patient verbalized understanding and agreement to the Pre-Surgical Instructions that were given to them at this PAT appointment. Patient was also educated of the need to review these PAT instructions again prior to her surgery.I reviewed the appropriate phone numbers to call if they have any and questions or concerns.

## 2022-08-27 ENCOUNTER — Encounter (HOSPITAL_COMMUNITY)
Admission: RE | Admit: 2022-08-27 | Discharge: 2022-08-27 | Disposition: A | Discharge status: Home / Self Care | Payer: 59 | Source: Ambulatory Visit | Attending: Surgery | Admitting: Surgery

## 2022-08-27 ENCOUNTER — Other Ambulatory Visit: Payer: Self-pay

## 2022-08-27 ENCOUNTER — Encounter (HOSPITAL_COMMUNITY): Payer: Self-pay

## 2022-08-27 DIAGNOSIS — Z79620 Long term (current) use of immunosuppressive biologic: Secondary | ICD-10-CM | POA: Diagnosis not present

## 2022-08-27 DIAGNOSIS — Z01818 Encounter for other preprocedural examination: Secondary | ICD-10-CM | POA: Diagnosis not present

## 2022-08-27 HISTORY — DX: Crohn's disease, unspecified, without complications: K50.90

## 2022-08-27 HISTORY — DX: Personal history of urinary calculi: Z87.442

## 2022-08-27 LAB — CBC
HCT: 38.5 % (ref 36.0–46.0)
Hemoglobin: 11.6 g/dL — ABNORMAL LOW (ref 12.0–15.0)
MCH: 22.8 pg — ABNORMAL LOW (ref 26.0–34.0)
MCHC: 30.1 g/dL (ref 30.0–36.0)
MCV: 75.8 fL — ABNORMAL LOW (ref 80.0–100.0)
Platelets: 459 10*3/uL — ABNORMAL HIGH (ref 150–400)
RBC: 5.08 MIL/uL (ref 3.87–5.11)
RDW: 17 % — ABNORMAL HIGH (ref 11.5–15.5)
WBC: 6.7 10*3/uL (ref 4.0–10.5)
nRBC: 0 % (ref 0.0–0.2)

## 2022-08-27 LAB — COMPREHENSIVE METABOLIC PANEL
ALT: 14 U/L (ref 0–44)
AST: 14 U/L — ABNORMAL LOW (ref 15–41)
Albumin: 4.1 g/dL (ref 3.5–5.0)
Alkaline Phosphatase: 84 U/L (ref 38–126)
Anion gap: 8 (ref 5–15)
BUN: 8 mg/dL (ref 6–20)
CO2: 25 mmol/L (ref 22–32)
Calcium: 9.1 mg/dL (ref 8.9–10.3)
Chloride: 105 mmol/L (ref 98–111)
Creatinine, Ser: 0.57 mg/dL (ref 0.44–1.00)
GFR, Estimated: >60 mL/min (ref >60)
Glucose, Bld: 100 mg/dL — ABNORMAL HIGH (ref 70–99)
Potassium: 3.7 mmol/L (ref 3.5–5.1)
Sodium: 138 mmol/L (ref 135–145)
Total Bilirubin: 0.4 mg/dL (ref 0.3–1.2)
Total Protein: 7.6 g/dL (ref 6.5–8.1)

## 2022-08-28 ENCOUNTER — Encounter (HOSPITAL_COMMUNITY)
Admission: RE | Disposition: A | Discharge status: Home / Self Care | Payer: Self-pay | Source: Home / Self Care | Attending: Surgery

## 2022-08-28 ENCOUNTER — Ambulatory Visit (HOSPITAL_COMMUNITY): Payer: 59 | Admitting: Anesthesiology

## 2022-08-28 ENCOUNTER — Ambulatory Visit (HOSPITAL_COMMUNITY)
Admission: RE | Admit: 2022-08-28 | Discharge: 2022-08-28 | Disposition: A | Discharge status: Home / Self Care | Payer: 59 | Attending: Surgery | Admitting: Surgery

## 2022-08-28 ENCOUNTER — Other Ambulatory Visit: Payer: Self-pay

## 2022-08-28 ENCOUNTER — Encounter (HOSPITAL_COMMUNITY): Payer: Self-pay | Admitting: Surgery

## 2022-08-28 ENCOUNTER — Ambulatory Visit (HOSPITAL_BASED_OUTPATIENT_CLINIC_OR_DEPARTMENT_OTHER): Payer: 59 | Admitting: Anesthesiology

## 2022-08-28 DIAGNOSIS — F419 Anxiety disorder, unspecified: Secondary | ICD-10-CM | POA: Insufficient documentation

## 2022-08-28 DIAGNOSIS — K219 Gastro-esophageal reflux disease without esophagitis: Secondary | ICD-10-CM | POA: Insufficient documentation

## 2022-08-28 DIAGNOSIS — L723 Sebaceous cyst: Secondary | ICD-10-CM | POA: Diagnosis not present

## 2022-08-28 DIAGNOSIS — Z8711 Personal history of peptic ulcer disease: Secondary | ICD-10-CM | POA: Insufficient documentation

## 2022-08-28 DIAGNOSIS — G4730 Sleep apnea, unspecified: Secondary | ICD-10-CM | POA: Insufficient documentation

## 2022-08-28 DIAGNOSIS — K5090 Crohn's disease, unspecified, without complications: Secondary | ICD-10-CM | POA: Insufficient documentation

## 2022-08-28 DIAGNOSIS — Z87891 Personal history of nicotine dependence: Secondary | ICD-10-CM

## 2022-08-28 DIAGNOSIS — L720 Epidermal cyst: Secondary | ICD-10-CM | POA: Diagnosis not present

## 2022-08-28 DIAGNOSIS — Z79899 Other long term (current) drug therapy: Secondary | ICD-10-CM | POA: Diagnosis not present

## 2022-08-28 DIAGNOSIS — F32A Depression, unspecified: Secondary | ICD-10-CM | POA: Diagnosis not present

## 2022-08-28 DIAGNOSIS — J45909 Unspecified asthma, uncomplicated: Secondary | ICD-10-CM | POA: Diagnosis not present

## 2022-08-28 DIAGNOSIS — G4733 Obstructive sleep apnea (adult) (pediatric): Secondary | ICD-10-CM | POA: Diagnosis not present

## 2022-08-28 HISTORY — PX: LIPOMA EXCISION: SHX5283

## 2022-08-28 LAB — POCT PREGNANCY, URINE: Preg Test, Ur: NEGATIVE

## 2022-08-28 SURGERY — EXCISION LIPOMA
Anesthesia: Monitor Anesthesia Care

## 2022-08-28 MED ORDER — MIDAZOLAM HCL 5 MG/5ML IJ SOLN
INTRAMUSCULAR | Status: DC | PRN
  Administered 2022-08-28: 2 mg via INTRAVENOUS

## 2022-08-28 MED ORDER — OXYCODONE HCL 5 MG PO TABS
5.0000 mg | ORAL_TABLET | Freq: Once | ORAL | Status: AC | PRN
  Administered 2022-08-28: 5 mg via ORAL

## 2022-08-28 MED ORDER — ORAL CARE MOUTH RINSE: 15.0000 mL | Freq: Once | OROMUCOSAL | Status: AC

## 2022-08-28 MED ORDER — PROPOFOL 10 MG/ML IV BOLUS
INTRAVENOUS | Status: DC | PRN
  Administered 2022-08-28: 20 mg via INTRAVENOUS

## 2022-08-28 MED ORDER — CHLORHEXIDINE GLUCONATE 4 % EX SOLN: 60.0000 mL | Freq: Once | CUTANEOUS | Status: DC

## 2022-08-28 MED ORDER — PROPOFOL 1000 MG/100ML IV EMUL
INTRAVENOUS | Status: AC
  Filled 2022-08-28: qty 100

## 2022-08-28 MED ORDER — ONDANSETRON HCL 4 MG/2ML IJ SOLN
INTRAMUSCULAR | Status: AC
  Filled 2022-08-28: qty 2

## 2022-08-28 MED ORDER — OXYCODONE HCL 5 MG/5ML PO SOLN: 5.0000 mg | Freq: Once | ORAL | Status: AC | PRN

## 2022-08-28 MED ORDER — FENTANYL CITRATE PF 50 MCG/ML IJ SOSY
PREFILLED_SYRINGE | INTRAMUSCULAR | Status: AC
  Filled 2022-08-28: qty 1

## 2022-08-28 MED ORDER — BUPIVACAINE HCL (PF) 0.25 % IJ SOLN
INTRAMUSCULAR | Status: DC | PRN
  Administered 2022-08-28: 10 mL

## 2022-08-28 MED ORDER — BUPIVACAINE LIPOSOME 1.3 % IJ SUSP: 20.0000 mL | Freq: Once | INTRAMUSCULAR | Status: DC

## 2022-08-28 MED ORDER — BUPIVACAINE HCL (PF) 0.25 % IJ SOLN
INTRAMUSCULAR | Status: AC
  Filled 2022-08-28: qty 30

## 2022-08-28 MED ORDER — AMISULPRIDE (ANTIEMETIC) 5 MG/2ML IV SOLN: 10.0000 mg | Freq: Once | INTRAVENOUS | Status: DC | PRN

## 2022-08-28 MED ORDER — GABAPENTIN 300 MG PO CAPS
300.0000 mg | ORAL_CAPSULE | ORAL | Status: AC
  Administered 2022-08-28: 300 mg via ORAL
  Filled 2022-08-28: qty 1

## 2022-08-28 MED ORDER — FENTANYL CITRATE (PF) 100 MCG/2ML IJ SOLN
INTRAMUSCULAR | Status: DC | PRN
  Administered 2022-08-28: 100 ug via INTRAVENOUS

## 2022-08-28 MED ORDER — CHLORHEXIDINE GLUCONATE 0.12 % MT SOLN
15.0000 mL | Freq: Once | OROMUCOSAL | Status: AC
  Administered 2022-08-28: 15 mL via OROMUCOSAL

## 2022-08-28 MED ORDER — FENTANYL CITRATE PF 50 MCG/ML IJ SOSY
25.0000 ug | PREFILLED_SYRINGE | INTRAMUSCULAR | Status: DC | PRN
  Administered 2022-08-28 (×2): 25 ug via INTRAVENOUS

## 2022-08-28 MED ORDER — FENTANYL CITRATE (PF) 100 MCG/2ML IJ SOLN
INTRAMUSCULAR | Status: AC
  Filled 2022-08-28: qty 2

## 2022-08-28 MED ORDER — PROMETHAZINE HCL 25 MG/ML IJ SOLN: 6.2500 mg | INTRAMUSCULAR | Status: DC | PRN

## 2022-08-28 MED ORDER — MIDAZOLAM HCL 2 MG/2ML IJ SOLN
INTRAMUSCULAR | Status: AC
  Filled 2022-08-28: qty 2

## 2022-08-28 MED ORDER — ACETAMINOPHEN 500 MG PO TABS
1000.0000 mg | ORAL_TABLET | ORAL | Status: AC
  Administered 2022-08-28: 1000 mg via ORAL
  Filled 2022-08-28: qty 2

## 2022-08-28 MED ORDER — TRAMADOL HCL 50 MG PO TABS: 50.0000 mg | ORAL_TABLET | Freq: Four times a day (QID) | ORAL | 0 refills | Status: AC | PRN

## 2022-08-28 MED ORDER — CEFAZOLIN SODIUM-DEXTROSE 2-4 GM/100ML-% IV SOLN
2.0000 g | INTRAVENOUS | Status: AC
  Administered 2022-08-28: 2 g via INTRAVENOUS

## 2022-08-28 MED ORDER — 0.9 % SODIUM CHLORIDE (POUR BTL) OPTIME
TOPICAL | Status: DC | PRN
  Administered 2022-08-28: 1000 mL

## 2022-08-28 MED ORDER — KETOROLAC TROMETHAMINE 30 MG/ML IJ SOLN: 30.0000 mg | Freq: Once | INTRAMUSCULAR | Status: DC | PRN

## 2022-08-28 MED ORDER — LACTATED RINGERS IV SOLN: INTRAVENOUS | Status: DC

## 2022-08-28 MED ORDER — CEFAZOLIN SODIUM-DEXTROSE 2-4 GM/100ML-% IV SOLN
INTRAVENOUS | Status: AC
  Filled 2022-08-28: qty 100

## 2022-08-28 MED ORDER — PROPOFOL 500 MG/50ML IV EMUL
INTRAVENOUS | Status: DC | PRN
  Administered 2022-08-28: 100 ug/kg/min via INTRAVENOUS

## 2022-08-28 MED ORDER — OXYCODONE HCL 5 MG PO TABS
ORAL_TABLET | ORAL | Status: AC
  Filled 2022-08-28: qty 1

## 2022-08-28 SURGICAL SUPPLY — 35 items
ADH SKN CLS APL DERMABOND .7 (GAUZE/BANDAGES/DRESSINGS)
APL SKNCLS STERI-STRIP NONHPOA (GAUZE/BANDAGES/DRESSINGS) ×1
BAG COUNTER SPONGE SURGICOUNT (BAG) IMPLANT
BAG SPNG CNTER NS LX DISP (BAG)
BENZOIN TINCTURE PRP APPL 2/3 (GAUZE/BANDAGES/DRESSINGS) IMPLANT
COVER SURGICAL LIGHT HANDLE (MISCELLANEOUS) ×1 IMPLANT
DERMABOND ADVANCED .7 DNX12 (GAUZE/BANDAGES/DRESSINGS) IMPLANT
DRAPE LAPAROSCOPIC ABDOMINAL (DRAPES) IMPLANT
DRAPE LAPAROTOMY T 102X78X121 (DRAPES) IMPLANT
DRAPE LAPAROTOMY TRNSV 102X78 (DRAPES) IMPLANT
DRAPE UTILITY XL STRL (DRAPES) ×1 IMPLANT
DRSG TEGADERM 4X4.75 (GAUZE/BANDAGES/DRESSINGS) IMPLANT
ELECT REM PT RETURN 15FT ADLT (MISCELLANEOUS) ×1 IMPLANT
GAUZE SPONGE 4X4 12PLY STRL (GAUZE/BANDAGES/DRESSINGS) ×1 IMPLANT
GLOVE BIO SURGEON STRL SZ 6 (GLOVE) ×1 IMPLANT
GLOVE INDICATOR 6.5 STRL GRN (GLOVE) ×1 IMPLANT
GOWN STRL REUS W/ TWL LRG LVL3 (GOWN DISPOSABLE) ×1 IMPLANT
GOWN STRL REUS W/ TWL XL LVL3 (GOWN DISPOSABLE) IMPLANT
GOWN STRL REUS W/TWL LRG LVL3 (GOWN DISPOSABLE) ×1
GOWN STRL REUS W/TWL XL LVL3 (GOWN DISPOSABLE)
KIT BASIN OR (CUSTOM PROCEDURE TRAY) ×1 IMPLANT
KIT TURNOVER KIT A (KITS) IMPLANT
MARKER SKIN DUAL TIP RULER LAB (MISCELLANEOUS) IMPLANT
NDL HYPO 22X1.5 SAFETY MO (MISCELLANEOUS) ×1 IMPLANT
NEEDLE HYPO 22X1.5 SAFETY MO (MISCELLANEOUS) ×1 IMPLANT
PACK GENERAL/GYN (CUSTOM PROCEDURE TRAY) ×1 IMPLANT
SPIKE FLUID TRANSFER (MISCELLANEOUS) IMPLANT
STAPLER VISISTAT 35W (STAPLE) IMPLANT
SUT MNCRL AB 4-0 PS2 18 (SUTURE) ×1 IMPLANT
SUT VIC AB 3-0 SH 27 (SUTURE) ×1
SUT VIC AB 3-0 SH 27XBRD (SUTURE) ×1 IMPLANT
SYR CONTROL 10ML LL (SYRINGE) ×1 IMPLANT
TAPE STRIPS DRAPE STRL (GAUZE/BANDAGES/DRESSINGS) IMPLANT
TOWEL OR 17X26 10 PK STRL BLUE (TOWEL DISPOSABLE) ×1 IMPLANT
TOWEL OR NON WOVEN STRL DISP B (DISPOSABLE) ×1 IMPLANT

## 2022-08-28 NOTE — Transfer of Care (Signed)
Immediate Anesthesia Transfer of Care Note  Patient: Marissa Morales  Procedure(s) Performed: EXCISION OF SUBCUTANEOUS CYST UPPER BACK  Patient Location: PACU  Anesthesia Type:MAC  Level of Consciousness: sedated  Airway & Oxygen Therapy: Patient Spontanous Breathing and Patient connected to face mask oxygen  Post-op Assessment: Report given to RN and Post -op Vital signs reviewed and stable  Post vital signs: Reviewed and stable  Last Vitals:  Vitals Value Taken Time  BP    Temp    Pulse 69 08/28/22 0901  Resp 14 08/28/22 0901  SpO2 100 % 08/28/22 0901  Vitals shown include unvalidated device data.  Last Pain:  Vitals:   08/28/22 0652  TempSrc: Oral  PainSc:          Complications: No notable events documented.

## 2022-08-28 NOTE — Op Note (Signed)
Operative Note  Marissa Morales  [id number]  [id number]  08/28/2022   Surgeon: Phylliss Blakes MD FACS   Procedure performed: excision of 1cm subcutaneous cyst, upper back   Preop diagnosis: Nonresolving and intermittently infected subcutaneous cyst of the upper back Post-op diagnosis/intraop findings: Same   Specimens: Upper back cyst Retained items: no  EBL: minimal cc Complications: none   Description of procedure: After obtaining informed consent the patient was taken to the operating room and placed in the right lateral decubitus position on the operating room table where MAC was initiated, preoperative antibiotics were administered, SCDs applied, and a formal timeout was performed. The upper back was prepped and draped in the usual sterile fashion. After infiltration with local an elliptical incision was made encompassing the lesion and the soft tissues were dissected with cautery until the entire cyst intact along with a margin of grossly normal subcutaneous tissue was excised. Hemostasis was ensured with cautery. The incision was closed with interrupted deep dermal 3-0 vicryl and running subcuticular 4-0 monocryl. Benzoin, steri strips, and a light pressure dressing of gauze and tape were applied.  The patient was then awakened and taken to PACU in stable condition.    All counts were correct at the completion of the case.

## 2022-08-28 NOTE — Discharge Instructions (Signed)
GENERAL SURGERY: POST OP INSTRUCTIONS ° °EAT °Gradually transition to a high fiber diet with a fiber supplement over the next few weeks after discharge.  Start with a pureed / full liquid diet (see below) ° °WALK °Walk an hour a day (cumulative, not all at once).  Control your pain to do that.   ° °CONTROL PAIN °Control pain so that you can walk, sleep, tolerate sneezing/coughing, go up/down stairs. ° °HAVE A BOWEL MOVEMENT DAILY °Keep your bowels regular to avoid problems.  OK to try a laxative to override constipation.  OK to use an antidairrheal to slow down diarrhea.  Call if not better after 2 tries ° °CALL IF YOU HAVE PROBLEMS/CONCERNS °Call if you are still struggling despite following these instructions. °Call if you have concerns not answered by these instructions ° ° ° °DIET: Follow a light bland diet & liquids the first 24 hours after arrival home, such as soup, liquids, starches, etc.  Be sure to drink plenty of fluids.  Quickly advance to a usual solid diet within a few days.  Avoid fast food or heavy meals as your are more likely to get nauseated or have irregular bowels.  A low-sugar, high-fiber diet for the rest of your life is ideal.   °Take your usually prescribed home medications unless otherwise directed. °PAIN CONTROL: °Pain is best controlled by a usual combination of three different methods TOGETHER: °Ice/Heat °Over the counter pain medication °Prescription pain medication °Most patients will experience some swelling and bruising around the incisions.  Ice packs or heating pads (30-60 minutes up to 6 times a day) will help. Use ice for the first few days to help decrease swelling and bruising, then switch to heat to help relax tight/sore spots and speed recovery.  Some people prefer to use ice alone, heat alone, alternating between ice & heat.  Experiment to what works for you.  Swelling and bruising can take several weeks to resolve.   °It is helpful to take an over-the-counter pain  medication regularly for the first few weeks.  Choose one of the following that works best for you: °Naproxen (Aleve, etc)  Two 220mg tabs twice a day OR Ibuprofen (Advil, etc) Three 200mg tabs four times a day (every meal & bedtime) AND °Acetaminophen (Tylenol, etc) 500-650mg four times a day (every meal & bedtime) °A  prescription for pain medication (such as oxycodone, hydrocodone, etc) should be given to you upon discharge.  Take your pain medication as prescribed.  °If you are having problems/concerns with the prescription medicine (does not control pain, nausea, vomiting, rash, itching, etc), please call us [phone] to see if we need to switch you to a different pain medicine that will work better for you and/or control your side effect better. °If you need a refill on your pain medication, please contact your pharmacy.  They will contact our office to request authorization. Prescriptions will not be filled after 5 pm or on week-ends. °Avoid getting constipated.  Between the surgery and the pain medications, it is common to experience some constipation.  Increasing fluid intake and taking a fiber supplement (such as Metamucil, Citrucel, FiberCon, MiraLax, etc) 1-2 times a day regularly will usually help prevent this problem from occurring.  A mild laxative (prune juice, Milk of Magnesia, MiraLax, etc) should be taken according to package directions if there are no bowel movements after 48 hours.   °Wash / shower every day, starting 2 days after surgery.  You may shower over the   steri strips as they are waterproof.  Continue to shower over incision(s) after the dressing is off. °Remove your outer bandage 2 days after surgery; steri strips will peel off after 1-2 weeks.  You may leave the incision open to air.  You may have skin tapes (Steri Strips) covering the incision(s).  Leave them on until one week, then remove.  You may replace a dressing/Band-Aid to cover the incision for comfort if you wish.   ° ° ° ° °ACTIVITIES as tolerated:   °You may resume regular (light) daily activities beginning the next day--such as daily self-care, walking, climbing stairs--gradually increasing activities as tolerated.  If you can walk 30 minutes without difficulty, it is safe to try more intense activity such as jogging, treadmill, bicycling, low-impact aerobics, swimming, etc. °Save the most intensive and strenuous activity for last such as sit-ups, heavy lifting, contact sports, etc  Refrain from any heavy lifting or straining until you are off narcotics for pain control.   °DO NOT PUSH THROUGH PAIN.  Let pain be your guide: If it hurts to do something, don't do it.  Pain is your body warning you to avoid that activity for another week until the pain goes down. °You may drive when you are no longer taking prescription pain medication, you can comfortably wear a seatbelt, and you can safely maneuver your car and apply brakes. °You may have sexual intercourse when it is comfortable.  °FOLLOW UP in our office °Please call [facility] at [phone] to set up an appointment to see your surgeon in the office for a follow-up appointment approximately 2-3 weeks after your surgery. °Make sure that you call for this appointment the day you arrive home to insure a convenient appointment time. °9. IF YOU HAVE DISABILITY OR FAMILY LEAVE FORMS, BRING THEM TO THE OFFICE FOR PROCESSING.  DO NOT GIVE THEM TO YOUR DOCTOR. ° ° °WHEN TO CALL US [phone]: °Poor pain control °Reactions / problems with new medications (rash/itching, nausea, etc)  °Fever over 101.5 F (38.5 C) °Worsening swelling or bruising °Continued bleeding from incision. °Increased pain, redness, or drainage from the incision °Difficulty breathing / swallowing ° ° The clinic staff is available to answer your questions during regular business hours (8:30am-5pm).  Please don’t hesitate to call and ask to speak to one of our nurses for clinical concerns.  ° If you have a medical  emergency, go to the nearest emergency room or call 911. ° A surgeon from [facility] is always on call at the hospitals ° ° °[facility] °[address], [address], [place], [place]  [address] ? °MAIN: [phone] ? TOLL FREE: [phone] ?  °FAX [phone] °[email or web address] ° °

## 2022-08-28 NOTE — Anesthesia Postprocedure Evaluation (Signed)
Anesthesia Post Note  Patient: Marissa Morales  Procedure(s) Performed: EXCISION OF SUBCUTANEOUS CYST UPPER BACK     Patient location during evaluation: PACU Anesthesia Type: MAC Level of consciousness: awake Pain management: pain level controlled Vital Signs Assessment: post-procedure vital signs reviewed and stable Respiratory status: spontaneous breathing, nonlabored ventilation and respiratory function stable Cardiovascular status: blood pressure returned to baseline and stable Postop Assessment: no apparent nausea or vomiting Anesthetic complications: no   No notable events documented.  Last Vitals:  Vitals:   08/28/22 0930 08/28/22 0939  BP: 93/60 (!) 127/94  Pulse: (!) 56 75  Resp: 11 12  Temp: (!) 36.4 C (!) 36.4 C  SpO2: 100% 100%    Last Pain:  Vitals:   08/28/22 0939  TempSrc:   PainSc: 3                  Winter Jocelyn P Keyla Milone

## 2022-08-28 NOTE — Interval H&P Note (Signed)
History and Physical Interval Note:  08/28/2022 7:05 AM  Marissa Morales  has presented today for surgery, with the diagnosis of SEBACEOUS CYST.  The various methods of treatment have been discussed with the patient and family. After consideration of risks, benefits and other options for treatment, the patient has consented to  Procedure(s): EXCISION OF SUBCUTANEOUS CYST UPPER BACK (N/A) as a surgical intervention.  The patient's history has been reviewed, patient examined, no change in status, stable for surgery.  I have reviewed the patient's chart and labs.  Questions were answered to the patient's satisfaction.     Rechy Bost Lollie Sails

## 2022-08-28 NOTE — Anesthesia Preprocedure Evaluation (Addendum)
Anesthesia Evaluation  Patient identified by MRN, date of birth, ID band Patient awake    Reviewed: Allergy & Precautions, NPO status , Patient's Chart, lab work & pertinent test results  Airway Mallampati: II  TM Distance: >3 FB Neck ROM: Full    Dental no notable dental hx.    Pulmonary asthma , sleep apnea and Continuous Positive Airway Pressure Ventilation , former smoker   Pulmonary exam normal        Cardiovascular negative cardio ROS Normal cardiovascular exam     Neuro/Psych  PSYCHIATRIC DISORDERS Anxiety Depression    negative neurological ROS     GI/Hepatic Neg liver ROS, PUD,GERD  Medicated and Controlled,,  Endo/Other  negative endocrine ROS    Renal/GU negative Renal ROS     Musculoskeletal negative musculoskeletal ROS (+)    Abdominal   Peds  Hematology negative hematology ROS (+)   Anesthesia Other Findings SEBACEOUS CYST  Reproductive/Obstetrics Hcg negative                             Anesthesia Physical Anesthesia Plan  ASA: 3  Anesthesia Plan: MAC   Post-op Pain Management:    Induction: Intravenous  PONV Risk Score and Plan: 2 and Ondansetron, Dexamethasone, Propofol infusion, Midazolam and Treatment may vary due to age or medical condition  Airway Management Planned: Simple Face Mask  Additional Equipment:   Intra-op Plan:   Post-operative Plan:   Informed Consent: I have reviewed the patients History and Physical, chart, labs and discussed the procedure including the risks, benefits and alternatives for the proposed anesthesia with the patient or authorized representative who has indicated his/her understanding and acceptance.     Dental advisory given  Plan Discussed with: CRNA  Anesthesia Plan Comments:        Anesthesia Quick Evaluation

## 2022-08-29 ENCOUNTER — Encounter: Payer: Self-pay | Admitting: Gastroenterology

## 2022-08-29 ENCOUNTER — Encounter (HOSPITAL_COMMUNITY): Payer: Self-pay | Admitting: Surgery

## 2022-08-29 LAB — SURGICAL PATHOLOGY

## 2022-09-05 ENCOUNTER — Encounter: Payer: Self-pay | Admitting: Gastroenterology

## 2022-09-06 DIAGNOSIS — G4733 Obstructive sleep apnea (adult) (pediatric): Secondary | ICD-10-CM | POA: Diagnosis not present

## 2022-09-08 ENCOUNTER — Emergency Department (HOSPITAL_BASED_OUTPATIENT_CLINIC_OR_DEPARTMENT_OTHER): Payer: 59

## 2022-09-08 ENCOUNTER — Emergency Department (HOSPITAL_BASED_OUTPATIENT_CLINIC_OR_DEPARTMENT_OTHER)
Admission: EM | Admit: 2022-09-08 | Discharge: 2022-09-09 | Disposition: A | Discharge status: Home / Self Care | Payer: 59 | Attending: Emergency Medicine | Admitting: Emergency Medicine

## 2022-09-08 ENCOUNTER — Encounter (HOSPITAL_BASED_OUTPATIENT_CLINIC_OR_DEPARTMENT_OTHER): Payer: Self-pay

## 2022-09-08 DIAGNOSIS — R109 Unspecified abdominal pain: Secondary | ICD-10-CM

## 2022-09-08 DIAGNOSIS — L03312 Cellulitis of back [any part except buttock]: Secondary | ICD-10-CM | POA: Diagnosis not present

## 2022-09-08 DIAGNOSIS — L0390 Cellulitis, unspecified: Secondary | ICD-10-CM

## 2022-09-08 DIAGNOSIS — R1030 Lower abdominal pain, unspecified: Secondary | ICD-10-CM | POA: Insufficient documentation

## 2022-09-08 DIAGNOSIS — L03311 Cellulitis of abdominal wall: Secondary | ICD-10-CM | POA: Diagnosis not present

## 2022-09-08 DIAGNOSIS — R300 Dysuria: Secondary | ICD-10-CM | POA: Insufficient documentation

## 2022-09-08 LAB — URINALYSIS, ROUTINE W REFLEX MICROSCOPIC
Bilirubin Urine: NEGATIVE
Glucose, UA: NEGATIVE mg/dL
Hgb urine dipstick: NEGATIVE
Ketones, ur: NEGATIVE mg/dL
Leukocytes,Ua: NEGATIVE
Nitrite: NEGATIVE
Protein, ur: NEGATIVE mg/dL
Specific Gravity, Urine: 1.015 (ref 1.005–1.030)
pH: 7 (ref 5.0–8.0)

## 2022-09-08 LAB — COMPREHENSIVE METABOLIC PANEL
ALT: 11 U/L (ref 0–44)
AST: 14 U/L — ABNORMAL LOW (ref 15–41)
Albumin: 4.4 g/dL (ref 3.5–5.0)
Alkaline Phosphatase: 80 U/L (ref 38–126)
Anion gap: 9 (ref 5–15)
BUN: 13 mg/dL (ref 6–20)
CO2: 23 mmol/L (ref 22–32)
Calcium: 9.1 mg/dL (ref 8.9–10.3)
Chloride: 103 mmol/L (ref 98–111)
Creatinine, Ser: 0.59 mg/dL (ref 0.44–1.00)
GFR, Estimated: >60 mL/min (ref >60)
Glucose, Bld: 96 mg/dL (ref 70–99)
Potassium: 3.8 mmol/L (ref 3.5–5.1)
Sodium: 135 mmol/L (ref 135–145)
Total Bilirubin: 0.4 mg/dL (ref 0.3–1.2)
Total Protein: 7.9 g/dL (ref 6.5–8.1)

## 2022-09-08 LAB — CBC WITH DIFFERENTIAL/PLATELET
Abs Immature Granulocytes: 0.03 10*3/uL (ref 0.00–0.07)
Basophils Absolute: 0.1 10*3/uL (ref 0.0–0.1)
Basophils Relative: 1 %
Eosinophils Absolute: 0.1 10*3/uL (ref 0.0–0.5)
Eosinophils Relative: 1 %
HCT: 35.2 % — ABNORMAL LOW (ref 36.0–46.0)
Hemoglobin: 11 g/dL — ABNORMAL LOW (ref 12.0–15.0)
Immature Granulocytes: 0 %
Lymphocytes Relative: 37 %
Lymphs Abs: 4 10*3/uL (ref 0.7–4.0)
MCH: 23.2 pg — ABNORMAL LOW (ref 26.0–34.0)
MCHC: 31.3 g/dL (ref 30.0–36.0)
MCV: 74.1 fL — ABNORMAL LOW (ref 80.0–100.0)
Monocytes Absolute: 0.6 10*3/uL (ref 0.1–1.0)
Monocytes Relative: 6 %
Neutro Abs: 5.9 10*3/uL (ref 1.7–7.7)
Neutrophils Relative %: 55 %
Platelets: 451 10*3/uL — ABNORMAL HIGH (ref 150–400)
RBC: 4.75 MIL/uL (ref 3.87–5.11)
RDW: 17.2 % — ABNORMAL HIGH (ref 11.5–15.5)
WBC: 10.7 10*3/uL — ABNORMAL HIGH (ref 4.0–10.5)
nRBC: 0 % (ref 0.0–0.2)

## 2022-09-08 LAB — PREGNANCY, URINE: Preg Test, Ur: NEGATIVE

## 2022-09-08 MED ORDER — CEPHALEXIN 250 MG PO CAPS
500.0000 mg | ORAL_CAPSULE | Freq: Once | ORAL | Status: AC
  Administered 2022-09-08: 500 mg via ORAL
  Filled 2022-09-08: qty 2

## 2022-09-08 NOTE — ED Triage Notes (Signed)
Pt reports rt. Sided flank pain that has now moved to supra-pubic area.  +dysuria, hx of kidney stones  Also states she had sx 2 weeks ago to remove a cyst on her upper back. Incision is open slightly and she is concerned for infection No drainage noted

## 2022-09-08 NOTE — ED Provider Notes (Signed)
[facility] Provider Note   CSN: [id number] Arrival date & time: 09/08/22  2110     History {Add pertinent medical, surgical, social history, OB history to HPI:1} Chief Complaint  Patient presents with   Abdominal Pain    Marissa Morales is a 27 y.o. female with near kidney stone, region sebaceous cyst removal here presenting with abdominal pain and concern regarding infection of the wound.  Patient has sebaceous cyst removal on 7/10.  Patient states for the last several days, the wound has came apart.  She was concerned about potential infection.  Denies any purulent discharge.  Denies any fever.  Patient called her surgeon and does not have follow-up until next week.  Patient also has been having lower abdominal pain for several days.  She states that she has some right flank pain as well as some dysuria.  Patient states that she has a history of UTI.  Denies any vaginal discharge.  The history is provided by the patient.       Home Medications Prior to Admission medications   Medication Sig Start Date End Date Taking? Authorizing Provider  albuterol (PROVENTIL) (2.5 MG/3ML) 0.083% nebulizer solution Take 2.5 mg by nebulization every 6 (six) hours as needed for wheezing or shortness of breath.    [provider]  albuterol (VENTOLIN HFA) 108 (90 Base) MCG/ACT inhaler Inhale 1-2 puffs into the lungs every 6 (six) hours as needed for wheezing or shortness of breath. 03/15/20   [provider]  cetirizine (ZYRTEC) 10 MG tablet Take 10 mg by mouth daily. 11/28/20   [provider]  clindamycin (CLEOCIN T) 1 % SWAB Apply 1 Application topically 2 (two) times daily. 08/14/21   [provider]  clobetasol cream (TEMOVATE) 0.05 % Apply 1 Application topically 2 (two) times daily as needed (irritation). 08/13/21   [provider]  diclofenac Sodium (VOLTAREN) 1 % GEL Apply 2-3 g topically 4 (four) times daily  as needed (pain). 12/20/20   [provider]  dicyclomine (BENTYL) 10 MG capsule TAKE 1 CAPSULE 2-3 TIMES A DAY AS NEEDED FOR ABDOMINAL CRAMPS 01/21/22   Sherrilyn Rist, MD  fluticasone-salmeterol (ADVAIR DISKUS) 250-50 MCG/ACT AEPB INHALE 1 PUFF BY MOUTH INTO THE LUNGS IN THE MORNING AND AT BEDTIME 09/17/21   Charlott Holler, MD  hydrocortisone cream 1 % Apply to affected area 2 times daily Patient taking differently: Apply 1 Application topically 2 (two) times daily as needed for itching. 03/24/21   Achille Rich, PA-C  hydrOXYzine (ATARAX) 25 MG tablet Take 25 mg by mouth daily as needed for anxiety (panic attack). 02/19/22   [provider]  linaclotide Karlene Einstein) 72 MCG capsule Take 1 capsule (72 mcg total) by mouth daily before breakfast. Patient not taking: Reported on 08/21/2022 05/15/21   Sherrilyn Rist, MD  lubiprostone (AMITIZA) 8 MCG capsule TAKE 1 CAPSULE (8 MCG TOTAL) BY MOUTH 2 (TWO) TIMES DAILY WITH A MEAL. 01/08/22   Sherrilyn Rist, MD  mesalamine (ROWASA) 4 g enema PLACE 60 MLS RECTALLY AT BEDTIME. RETAIN OVERNIGHT IF POSSIBLE 06/07/21   Sherrilyn Rist, MD  montelukast (SINGULAIR) 10 MG tablet Take 10 mg by mouth at bedtime. 03/19/21   [provider]  mupirocin ointment (BACTROBAN) 2 % Apply 1 Application topically 2 (two) times daily. Patient not taking: Reported on 08/21/2022 08/13/21   [provider]  NON FORMULARY Pt uses a cpap nightly  [provider]  omeprazole (PRILOSEC) 40 MG capsule TAKE 1 CAPSULE (40 MG TOTAL) BY MOUTH DAILY. 08/07/22   Sherrilyn Rist, MD  omeprazole (PRILOSEC) 40 MG capsule Take 1 capsule (40 mg total) by mouth daily. Patient not taking: Reported on 08/21/2022 07/10/22   [name], MD  ondansetron (ZOFRAN) 4 MG tablet TAKE 1 TABLET BY MOUTH EVERY 8 HOURS AS NEEDED FOR NAUSEA AND VOMITING 02/25/22   Sherrilyn Rist, MD  oxyCODONE-acetaminophen (PERCOCET) 5-325 MG tablet Take 1-2 tablets by  mouth every 6 (six) hours as needed for severe pain. 08/22/21   Sherrilyn Rist, MD  Risankizumab-[facility]) 180 MG/1.2ML SOCT Inject (1.2 ml) under the skin every 8 weeks 03/28/22   Sherrilyn Rist, MD  sertraline (ZOLOFT) 50 MG tablet Take 50 mg by mouth daily. 03/01/20   [provider]  traZODone (DESYREL) 50 MG tablet TAKE 1 TABLET BY MOUTH EVERYDAY AT BEDTIME 07/04/22   Sherrilyn Rist, MD      Allergies    Bactrim [sulfamethoxazole-trimethoprim], Bismuth-containing compounds, Doxycycline, Vancomycin, and Vantin [cefpodoxime]    Review of Systems   Review of Systems  Gastrointestinal:  Positive for abdominal pain.  All other systems reviewed and are negative.   Physical Exam Updated Vital Signs BP (!) 151/93 (BP Location: Left Arm)   Pulse 74   Temp 98.6 F (37 C) (Oral)   Resp 19   Ht 5\' 1"  (1.549 m)   Wt 72.1 kg   LMP 08/04/2022 (Exact Date)   SpO2 100%   BMI 30.04 kg/m  Physical Exam Vitals and nursing note reviewed.  Constitutional:      Appearance: She is well-developed.  HENT:     Head: Normocephalic.     Mouth/Throat:     Mouth: Mucous membranes are moist.     Pharynx: Oropharynx is clear.  Eyes:     Extraocular Movements: Extraocular movements intact.     Pupils: Pupils are equal, round, and reactive to light.  Cardiovascular:     Rate and Rhythm: Normal rate and regular rhythm.  Pulmonary:     Effort: Pulmonary effort is normal.     Breath sounds: Normal breath sounds.  Abdominal:     General: Abdomen is flat.     Comments: Mild right CVA tenderness.  Mild suprapubic tenderness  Skin:    Comments: Patient has sebaceous cyst removal in the upper back.  The right side of the wound has been acquired.  Patient has no fluctuance but there is surrounding redness.  Neurological:     General: No focal deficit present.     Mental Status: She is alert and oriented to person, place, and time.  Psychiatric:        Mood and Affect: Mood normal.         Behavior: Behavior normal.     ED Results / Procedures / Treatments   Labs (all labs ordered are listed, but only abnormal results are displayed) Labs Reviewed  CBC WITH DIFFERENTIAL/PLATELET - Abnormal; Notable for the following components:      Result Value   WBC 10.7 (*)    Hemoglobin 11.0 (*)    HCT 35.2 (*)    MCV 74.1 (*)    MCH 23.2 (*)    RDW 17.2 (*)    Platelets 451 (*)    All other components within normal limits  COMPREHENSIVE METABOLIC PANEL - Abnormal; Notable for the following components:  AST 14 (*)    All other components within normal limits  PREGNANCY, URINE  URINALYSIS, ROUTINE W REFLEX MICROSCOPIC    EKG None  Radiology No results found.  Procedures Procedures  {Document cardiac monitor, telemetry assessment procedure when appropriate:1}  Medications Ordered in ED Medications  cephALEXin (KEFLEX) capsule 500 mg (has no administration in time range)    ED Course/ Medical Decision Making/ A&P   {   Click here for ABCD2, HEART and other calculatorsREFRESH Note before signing :1}                          Medical Decision Making Marissa Morales is a 27 y.o. female here presenting with 2 complaints.  Patient does have some wound dehiscence and some surrounding redness.  I think there may be an early cellulitis but no evidence of abscess.  Patient also has some flank pain as well as dysuria.  Considered Pyonephritis versus renal colic.  Plan to get CBC and CMP and UA and CT renal stone.  Will give Keflex to cover UTI and possible wound infection    Amount and/or Complexity of Data Reviewed Labs: ordered. Radiology: ordered.  Risk Prescription drug management.   Final Clinical Impression(s) / ED Diagnoses Final diagnoses:  None    Rx / DC Orders ED Discharge Orders     None

## 2022-09-09 MED ORDER — CEPHALEXIN 500 MG PO CAPS: 500.0000 mg | ORAL_CAPSULE | Freq: Three times a day (TID) | ORAL | 0 refills | Status: DC

## 2022-09-09 NOTE — Discharge Instructions (Signed)
Please take Keflex 500 mg 3 times a day for a week.  This will help prevent wound infection  As we discussed, you do not have a kidney stone right now  Take Tylenol and Motrin for pain  See your surgeon this week for follow up   Return to ER if you have purulent discharge from the wound or fever or severe flank pain

## 2022-09-11 ENCOUNTER — Other Ambulatory Visit: Payer: Self-pay | Admitting: Gastroenterology

## 2022-09-22 ENCOUNTER — Other Ambulatory Visit: Payer: Self-pay | Admitting: Internal Medicine

## 2022-09-30 DIAGNOSIS — L209 Atopic dermatitis, unspecified: Secondary | ICD-10-CM | POA: Diagnosis not present

## 2022-09-30 DIAGNOSIS — L700 Acne vulgaris: Secondary | ICD-10-CM | POA: Diagnosis not present

## 2022-10-07 DIAGNOSIS — G4733 Obstructive sleep apnea (adult) (pediatric): Secondary | ICD-10-CM | POA: Diagnosis not present

## 2022-11-04 ENCOUNTER — Other Ambulatory Visit: Payer: Self-pay

## 2022-11-04 ENCOUNTER — Encounter: Payer: Self-pay | Admitting: Gastroenterology

## 2022-11-04 MED ORDER — OMEPRAZOLE 40 MG PO CPDR: 40.0000 mg | DELAYED_RELEASE_CAPSULE | Freq: Every day | ORAL | 2 refills | Status: DC

## 2022-11-05 DIAGNOSIS — G4733 Obstructive sleep apnea (adult) (pediatric): Secondary | ICD-10-CM | POA: Diagnosis not present

## 2022-11-07 DIAGNOSIS — G4733 Obstructive sleep apnea (adult) (pediatric): Secondary | ICD-10-CM | POA: Diagnosis not present

## 2022-11-11 ENCOUNTER — Other Ambulatory Visit: Payer: Self-pay

## 2022-11-11 MED ORDER — OMEPRAZOLE 40 MG PO CPDR: 40.0000 mg | DELAYED_RELEASE_CAPSULE | Freq: Every day | ORAL | 2 refills | Status: DC

## 2022-11-14 DIAGNOSIS — R509 Fever, unspecified: Secondary | ICD-10-CM | POA: Diagnosis not present

## 2022-11-14 DIAGNOSIS — R059 Cough, unspecified: Secondary | ICD-10-CM | POA: Diagnosis not present

## 2022-11-14 DIAGNOSIS — R070 Pain in throat: Secondary | ICD-10-CM | POA: Diagnosis not present

## 2022-11-14 DIAGNOSIS — G4489 Other headache syndrome: Secondary | ICD-10-CM | POA: Diagnosis not present

## 2022-11-14 DIAGNOSIS — R5383 Other fatigue: Secondary | ICD-10-CM | POA: Diagnosis not present

## 2022-12-05 DIAGNOSIS — G4733 Obstructive sleep apnea (adult) (pediatric): Secondary | ICD-10-CM | POA: Diagnosis not present

## 2022-12-07 DIAGNOSIS — G4733 Obstructive sleep apnea (adult) (pediatric): Secondary | ICD-10-CM | POA: Diagnosis not present

## 2022-12-13 ENCOUNTER — Encounter: Payer: Self-pay | Admitting: Gastroenterology

## 2022-12-13 NOTE — Telephone Encounter (Signed)
Difficult to say if this may be Crohn's flare or infection (she has had C diff before).  She has also had an anal fissure in the past.  I recommend some recticare ointment in case this is a fissure.  Unless there is diarrhea, please also start half a capful of miralax daily.  I can see her in the office 4pm next Fri, Nov 1st.    Call on call weekend physician if needed.  - H. Danis

## 2022-12-13 NOTE — Telephone Encounter (Signed)
Spoke with patient regarding MD recommendations. A cancellation opened up for 11/1 at 9:20. Appointment has been made & she will call back if she needs to reschedule.

## 2022-12-18 DIAGNOSIS — D500 Iron deficiency anemia secondary to blood loss (chronic): Secondary | ICD-10-CM | POA: Insufficient documentation

## 2022-12-19 NOTE — Telephone Encounter (Signed)
Patient states she cannot come to the 11/1 appointment, requesting another date.

## 2022-12-20 ENCOUNTER — Ambulatory Visit: Payer: 59 | Admitting: Gastroenterology

## 2022-12-20 NOTE — Telephone Encounter (Signed)
Per Dr. Myrtie Neither, okay to schedule patient for OV 11/5 at 4:00 pm. Scheduled OV with patient, she plans to attend.

## 2022-12-24 ENCOUNTER — Other Ambulatory Visit (INDEPENDENT_AMBULATORY_CARE_PROVIDER_SITE_OTHER): Payer: 59

## 2022-12-24 ENCOUNTER — Ambulatory Visit: Payer: 59 | Admitting: Gastroenterology

## 2022-12-24 ENCOUNTER — Encounter: Payer: Self-pay | Admitting: Gastroenterology

## 2022-12-24 VITALS — BP 102/70 | HR 64 | Ht 61.0 in | Wt 160.4 lb

## 2022-12-24 DIAGNOSIS — K50811 Crohn's disease of both small and large intestine with rectal bleeding: Secondary | ICD-10-CM | POA: Diagnosis not present

## 2022-12-24 DIAGNOSIS — R1032 Left lower quadrant pain: Secondary | ICD-10-CM

## 2022-12-24 DIAGNOSIS — K625 Hemorrhage of anus and rectum: Secondary | ICD-10-CM | POA: Diagnosis not present

## 2022-12-24 LAB — CBC WITH DIFFERENTIAL/PLATELET
Basophils Absolute: 0.1 10*3/uL (ref 0.0–0.1)
Basophils Relative: 0.7 % (ref 0.0–3.0)
Eosinophils Absolute: 0.2 10*3/uL (ref 0.0–0.7)
Eosinophils Relative: 1.5 % (ref 0.0–5.0)
HCT: 36.1 % (ref 36.0–46.0)
Hemoglobin: 11.3 g/dL — ABNORMAL LOW (ref 12.0–15.0)
Lymphocytes Relative: 30.2 % (ref 12.0–46.0)
Lymphs Abs: 3.6 10*3/uL (ref 0.7–4.0)
MCHC: 31.4 g/dL (ref 30.0–36.0)
MCV: 72.9 fL — ABNORMAL LOW (ref 78.0–100.0)
Monocytes Absolute: 0.8 10*3/uL (ref 0.1–1.0)
Monocytes Relative: 6.5 % (ref 3.0–12.0)
Neutro Abs: 7.3 10*3/uL (ref 1.4–7.7)
Neutrophils Relative %: 61.1 % (ref 43.0–77.0)
Platelets: 455 10*3/uL — ABNORMAL HIGH (ref 150.0–400.0)
RBC: 4.95 Mil/uL (ref 3.87–5.11)
RDW: 17.3 % — ABNORMAL HIGH (ref 11.5–15.5)
WBC: 11.9 10*3/uL — ABNORMAL HIGH (ref 4.0–10.5)

## 2022-12-24 LAB — SEDIMENTATION RATE: Sed Rate: 32 mm/h — ABNORMAL HIGH (ref 0–20)

## 2022-12-24 LAB — HIGH SENSITIVITY CRP: CRP, High Sensitivity: 7.16 mg/L — ABNORMAL HIGH (ref 0.000–5.000)

## 2022-12-24 MED ORDER — OXYCODONE-ACETAMINOPHEN 5-325 MG PO TABS: 1.0000 | ORAL_TABLET | Freq: Four times a day (QID) | ORAL | 0 refills | Status: DC | PRN

## 2022-12-24 NOTE — Patient Instructions (Addendum)
Your provider has ordered "[employer]" stool testing for you. You have received a kit from our office today containing all necessary supplies to complete this test. Please carefully read the stool collection instructions provided in the kit before opening the accompanying materials. In addition, be sure to place the label from the top right corner of the laboratory request sheet onto the "puritan opti-swab" tube that is supplied in the kit. This label should include your full name and date of birth. After completing the test, you should secure the purtian tube into the specimen biohazard bag. The laboratory request information sheet (including date and time of specimen collection) should be placed into the outside pocket of the specimen biohazard bag and returned to the [facility] lab with 2 days of collection.   If the laboratory information sheet specimen date and time are not filled out, the test will NOT be performed.    Your provider has requested that you go to the basement level for lab work before leaving today. Press "B" on the elevator. The lab is located at the first door on the left as you exit the elevator.  _______________________________________________________  If your blood pressure at your visit was 140/90 or greater, please contact your primary care physician to follow up on this.  _______________________________________________________  If you are age 2 or older, your body mass index should be between 23-30. Your Body mass index is 30.31 kg/m. If this is out of the aforementioned range listed, please consider follow up with your Primary Care Provider.  If you are age 28 or younger, your body mass index should be between 19-25. Your Body mass index is 30.31 kg/m. If this is out of the aformentioned range listed, please consider follow up with your Primary Care Provider.   ________________________________________________________  The [facility] providers would like to encourage  you to use [facility] to communicate with providers for non-urgent requests or questions.  Due to long hold times on the telephone, sending your provider a message by [facility] may be a faster and more efficient way to get a response.  Please allow 48 business hours for a response.  Please remember that this is for non-urgent requests.  _______________________________________________________ It was a pleasure to see you today!  Thank you for trusting me with your gastrointestinal care!

## 2022-12-24 NOTE — Progress Notes (Unsigned)
[facility] progress note:  History: Marissa Morales 12/24/2022   Reason for consult/chief complaint: Crohn's Disease (Follow up, patient is having a flare: abdominal cramping, bleeding, constipation)   Subjective  Prior history: Years of marked colitis of the rectosigmoid junction, difficult to control on multiple meds, failed anti-TNF, failed Entyvio, finally evolved into more of a Crohn's-like picture including mild terminal ileitis.  Started on [place] and had complete symptomatic and mucosal remission on last colonoscopy January 2024.  Was doing well at clinic follow-up April 2024. History of anal fissure and C. difficile infection May 2024 office visit for acute symptoms of probable gastrointestinal infection,, tested negative for C. difficile. 15 mm cecal SSP removed March 2021.  Diminutive transverse colon tubular adenoma September 2022.  No polyps on January 2024 diagnostic colonoscopy, recall for polyp surveillance at January 2027  Marissa Morales contacted our office on 12/13/2022 complaining of several days of escalating lower abdominal/pelvic pain with passage of bloody mucus.  Chart review indicates that Marissa Morales had an excision of a subcutaneous cyst on the upper back in July, and then an ED visit shortly afterwards with cellulitis in the wound.  Discussed the use of AI scribe software for clinical note transcription with the patient, who gave verbal consent to proceed.  History of Present Illness   The patient, with a history of Crohn's disease, presents with worsening abdominal pain and bleeding over the past two weeks. The pain, primarily left-sided, has intensified over the past week and a half, requiring the use of previously prescribed pain medication, which had not been needed for the past year. The patient reports no significant changes in bowel habits, with constipation managed effectively with Amitiza.  The patient's last dose of Skyrizi, a medication for  Crohn's disease, was taken on the 21st of October, slightly delayed due to a recent COVID-19 infection. The patient's symptoms are reminiscent of previous Crohn's flare-ups.  In addition to the Crohn's disease, the patient had a cyst removed from the upper back in July, which subsequently became infected, requiring antibiotic treatment. The patient also has a history of C. diff infection.  The patient's symptoms have persisted despite the use of oxycodone for pain management, with breakthrough pain reported.       ROS:  Review of Systems No fever, rash, eye redness Nausea has lately been worse and she needs the ondansetron regularly during this flare  Past Medical History: Past Medical History:  Diagnosis Date   Allergy    cats   Anemia    Anxiety    panic attacks   Asthma    as a child - dirty cats will cause an asthma attack   Crohn's disease (HCC)    Depression    GERD (gastroesophageal reflux disease)    Hemorrhoids    History of asthma    childhood   History of kidney stones    IBS (irritable bowel syndrome)    Pilonidal cyst    Sleep apnea    Wears glasses      Past Surgical History: Past Surgical History:  Procedure Laterality Date   ANAL RECTAL MANOMETRY N/A 07/24/2020   Procedure: ANO RECTAL MANOMETRY;  Surgeon: Sherrilyn Rist, MD;  Location: [facility];  Service: Gastroenterology;  Laterality: N/A;   COLONOSCOPY     COLONOSCOPY WITH ESOPHAGOGASTRODUODENOSCOPY (EGD)  x2  last one 2015   KNEE ARTHROSCOPY Left 03/30/2020   LIPOMA EXCISION N/A 08/28/2022   Procedure: EXCISION OF SUBCUTANEOUS CYST UPPER BACK;  Surgeon:  Berna Bue, MD;  Location: [facility];  Service: General;  Laterality: N/A;   PILONIDAL CYST EXCISION N/A 07/18/2017   Procedure: CYST EXCISION PILONIDAL;  Surgeon: Berna Bue, MD;  Location:  SURGERY CENTER;  Service: General;  Laterality: N/A;   UPPER GASTROINTESTINAL ENDOSCOPY     WISDOM TOOTH EXTRACTION  2015      Family History: Family History  Problem Relation Age of Onset   Asthma Mother    Irritable bowel syndrome Mother    Endometriosis Mother    Colon cancer Mother    COPD Mother    Hypertension Father    Colon polyps Father    Endometriosis Sister    Irritable bowel syndrome Sister    Diverticulitis Sister    Heart attack Maternal Grandmother    Breast cancer Paternal Grandfather    Colon cancer Maternal Uncle    Liver cancer Neg Hx    Rectal cancer Neg Hx    Pancreatic cancer Neg Hx    Stomach cancer Neg Hx    Esophageal cancer Neg Hx     Social History: Social History   Socioeconomic History   Marital status: Single    Spouse name: Not on file   Number of children: Not on file   Years of education: Not on file   Highest education level: Not on file  Occupational History   Not on file  Tobacco Use   Smoking status: Former    Current packs/day: 0.00    Average packs/day: 0.5 packs/day for 5.0 years (2.5 ttl pk-yrs)    Types: Cigarettes, E-cigarettes    Start date: 05/15/2012    Quit date: 05/15/2017    Years since quitting: 5.6   Smokeless tobacco: Never   Tobacco comments:    07-15-2017  per pt quit smoking cig. 05-15-2017 but occasionally vapes  Vaping Use   Vaping status: Some Days  Substance and Sexual Activity   Alcohol use: Not Currently    Comment: rarely   Drug use: No   Sexual activity: Yes    Birth control/protection: None    Comment: Last menstral cycle approximately 2 weeks ago "Late August".  Pt denies being pregnant.  Other Topics Concern   Not on file  Social History Narrative   Not on file   Social Determinants of Health   Financial Resource Strain: Low Risk  (11/06/2020)   Received from [facility], [facility]   Overall Financial Resource Strain (CARDIA)    Difficulty of Paying Living Expenses: Not very hard  Food Insecurity: Food Insecurity Present (11/06/2020)   Received from [facility], [facility]   Hunger Vital Sign     Worried About Running Out of Food in the Last Year: Sometimes true    Ran Out of Food in the Last Year: Sometimes true  Transportation Needs: Unmet Transportation Needs (11/06/2020)   Received from [facility], [facility]   PRAPARE - Transportation    Lack of Transportation (Medical): Yes    Lack of Transportation (Non-Medical): Yes  Physical Activity: Sufficiently Active (11/06/2020)   Received from [facility], [facility]   Exercise Vital Sign    Days of Exercise per Week: 5 days    Minutes of Exercise per Session: 60 min  Stress: No Stress Concern Present (11/06/2020)   Received from [facility], [facility]   [employer] [facility]ational [facility] - Occupational Stress Questionnaire    Feeling of Stress : Only a little  Social Connections: Unknown (06/17/2021)  Received from [facility], [facility]   Social Network    Social Network: Not on file   On a positive note, she is recently engaged to be married to her longtime partner. Allergies: Allergies  Allergen Reactions   Bactrim [Sulfamethoxazole-Trimethoprim] Nausea And Vomiting   Bismuth-Containing Compounds Nausea And Vomiting   Doxycycline Nausea And Vomiting   Vancomycin Itching    Severe pruritus of scalp, flushing   Vantin [Cefpodoxime] Nausea And Vomiting    Childhood rx - GI upset    Outpatient Meds: Current Outpatient Medications  Medication Sig Dispense Refill   albuterol (PROVENTIL) (2.5 MG/3ML) 0.083% nebulizer solution Take 2.5 mg by nebulization every 6 (six) hours as needed for wheezing or shortness of breath.     albuterol (VENTOLIN HFA) 108 (90 Base) MCG/ACT inhaler Inhale 1-2 puffs into the lungs every 6 (six) hours as needed for wheezing or shortness of breath.     cephALEXin (KEFLEX) 500 MG capsule Take 1 capsule (500 mg total) by mouth 3 (three) times daily. 21 capsule 0   cetirizine (ZYRTEC) 10 MG tablet Take 10 mg by mouth daily.     clindamycin (CLEOCIN T) 1 % SWAB Apply 1  Application topically 2 (two) times daily.     clobetasol cream (TEMOVATE) 0.05 % Apply 1 Application topically 2 (two) times daily as needed (irritation).     diclofenac Sodium (VOLTAREN) 1 % GEL Apply 2-3 g topically 4 (four) times daily as needed (pain).     dicyclomine (BENTYL) 10 MG capsule TAKE 1 CAPSULE 2-3 TIMES A DAY AS NEEDED FOR ABDOMINAL CRAMPS 60 capsule 1   fluticasone-salmeterol (ADVAIR DISKUS) 250-50 MCG/ACT AEPB INHALE 1 PUFF BY MOUTH INTO THE LUNGS IN THE MORNING AND AT BEDTIME 180 each 1   hydrocortisone cream 1 % Apply to affected area 2 times daily (Patient taking differently: Apply 1 Application topically 2 (two) times daily as needed for itching.) 15 g 0   hydrOXYzine (ATARAX) 25 MG tablet Take 25 mg by mouth daily as needed for anxiety (panic attack).     linaclotide (LINZESS) 72 MCG capsule Take 1 capsule (72 mcg total) by mouth daily before breakfast. 90 capsule 1   lubiprostone (AMITIZA) 8 MCG capsule TAKE 1 CAPSULE (8 MCG TOTAL) BY MOUTH 2 (TWO) TIMES DAILY WITH A MEAL. 180 capsule 2   montelukast (SINGULAIR) 10 MG tablet Take 10 mg by mouth at bedtime.     mupirocin ointment (BACTROBAN) 2 % Apply 1 Application topically 2 (two) times daily.     NON FORMULARY Pt uses a cpap nightly     omeprazole (PRILOSEC) 40 MG capsule Take 1 capsule (40 mg total) by mouth daily. 90 capsule 2   ondansetron (ZOFRAN) 4 MG tablet TAKE 1 TABLET BY MOUTH EVERY 8 HOURS AS NEEDED FOR NAUSEA AND VOMITING 60 tablet 1   Risankizumab-rzaa (SKYRIZI) 180 MG/1.2ML SOCT Inject (1.2 ml) under the skin every 8 weeks 1.2 mL 6   sertraline (ZOLOFT) 50 MG tablet Take 50 mg by mouth daily.     traZODone (DESYREL) 50 MG tablet TAKE 1 TABLET BY MOUTH EVERYDAY AT BEDTIME 90 tablet 2   oxyCODONE-acetaminophen (PERCOCET) 5-325 MG tablet Take 1-2 tablets by mouth every 6 (six) hours as needed for severe pain (pain score 7-10). 20 tablet 0   No current facility-administered medications for this visit.       ___________________________________________________________________ Objective   Exam:  BP 102/70   Pulse 64   Ht 5\' 1"  (1.549 m)  Wt 160 lb 6.4 oz (72.8 kg)   BMI 30.31 kg/m  Wt Readings from Last 3 Encounters:  12/24/22 160 lb 6.4 oz (72.8 kg)  09/08/22 159 lb (72.1 kg)  08/28/22 157 lb (71.2 kg)   Ines Bloomer, CMA present for entire exam. General: Well-appearing Eyes: sclera anicteric, no redness ENT: oral mucosa moist without lesions, no cervical or supraclavicular lymphadenopathy CV: Regular without appreciable murmur, no JVD, no peripheral edema Resp: clear to auscultation bilaterally, normal RR and effort noted GI: soft, LLQ tenderness, with active bowel sounds. No guarding or palpable organomegaly noted. Skin; warm and dry, no rash or jaundice noted Neuro: awake, alert and oriented x 3. Normal gross motor function and fluent speech Perianal exam normal.  Normal DRE.  Rectal swab for C. difficile performed  Labs:  No recent data for review  Assessment: Encounter Diagnoses  Name Primary?   Crohn's disease of both small and large intestine with rectal bleeding (HCC) Yes   Rectal bleeding    LLQ abdominal pain     Assessment and Plan    Crohn's Disease Increased abdominal pain and bleeding over the past two weeks, suggestive of a flare. Patient is currently on Skyrizi every 8 weeks, last dose on October 21st. Recent stressors noted. No significant changes in bowel habits.  All symptoms typical for previous flares that she has experienced.  Because of her C. difficile history and antibiotic use in the last few months, we must rule out C. difficile.  (Which was done with a rectal swab in the office today)  If that C. difficile is negative, an answer which we should have for in about 48 hours, then I will give her a course of prednisone that has typically worked well for her in the past to settle down her flares.  I refilled oxycodone at her request and she  will continue to use as needed Zofran with refills as needed. For nursing and/or covering physician in case these results come back while I am out of the office: If C. difficile negative, prednisone course would be 40 mg daily for 7 days, 30 mg daily for 7 days, 20 mg daily for 7 days, 10 mg daily for 7 days  I am very hopeful we can get her over this flare back to good control of the condition with ongoing use of Skyrizi.  If symptoms do not respond as expected to steroids, or if they recur shortly after the course is complete, then we we will proceed to a fecal calprotectin and a sigmoidoscopy or colonoscopy to assess disease activity before considering a change of therapy.  Hopefully it will not come to that as we are on her third biologic agent for this condition.    Labs today: CBC, , iron studies, ESR, CRP    33 minutes were spent on this encounter (including chart review, history/exam, counseling/coordination of care, and documentation) > 50% of that time was spent on counseling and coordination of care.   [name]  CC: Referring provider noted above

## 2022-12-25 ENCOUNTER — Ambulatory Visit: Payer: 59 | Admitting: Internal Medicine

## 2022-12-25 ENCOUNTER — Encounter: Payer: Self-pay | Admitting: Internal Medicine

## 2022-12-25 ENCOUNTER — Other Ambulatory Visit: Payer: 59

## 2022-12-25 VITALS — BP 112/64 | HR 71 | Ht 66.0 in | Wt 160.6 lb

## 2022-12-25 DIAGNOSIS — J4530 Mild persistent asthma, uncomplicated: Secondary | ICD-10-CM | POA: Diagnosis not present

## 2022-12-25 DIAGNOSIS — J309 Allergic rhinitis, unspecified: Secondary | ICD-10-CM

## 2022-12-25 DIAGNOSIS — G4733 Obstructive sleep apnea (adult) (pediatric): Secondary | ICD-10-CM | POA: Diagnosis not present

## 2022-12-25 DIAGNOSIS — J351 Hypertrophy of tonsils: Secondary | ICD-10-CM

## 2022-12-25 LAB — IBC + FERRITIN
Ferritin: 6.9 ng/mL — ABNORMAL LOW (ref 10.0–291.0)
Iron: 25 ug/dL — ABNORMAL LOW (ref 42–145)
Saturation Ratios: 5.4 % — ABNORMAL LOW (ref 20.0–50.0)
TIBC: 459.2 ug/dL — ABNORMAL HIGH (ref 250.0–450.0)
Transferrin: 328 mg/dL (ref 212.0–360.0)

## 2022-12-25 LAB — FERRITIN: Ferritin: 6.9 ng/mL — ABNORMAL LOW (ref 10.0–291.0)

## 2022-12-25 MED ORDER — AZELASTINE-FLUTICASONE 137-50 MCG/ACT NA SUSP: 1.0000 | Freq: Every day | NASAL | 11 refills | Status: AC

## 2022-12-25 NOTE — Progress Notes (Signed)
Marissa Morales    [id number]    April 21, 1995  Primary Care Physician:Stamey, Verda Cumins, FNP Date of Appointment: 12/25/2022 Established Patient Visit  Chief complaint:   Chief Complaint  Patient presents with   Follow-up    Pt states she has been doing better and her cpap helps      HPI: Marissa Morales is a 27 y.o. woman with moderate persistent asthma, OSA on CPAP, and ulcerative colitis.   Interval Updates: Here for asthma follow up after 2 years. In the interim started on CPAP and doing very well.  No interval exacerbations for asthma.  Her crohn's she has been in remission for about a year but since she had covid last month her crohn's symptoms are worsening. May need to go on some steroids.  CPAP download reviewed - 97% usage, minimal leak, excellent suppression of AHI   Current Regimen: advair 250 1 puff bid, albuterol Asthma Triggers:cats, dogs, exercise Exacerbations in the last year: none requiring prednisone History of hospitalization or intubation: not as an adult Allergy Testing: none GERD: yes on ppi Allergic Rhinitis: yes on singulair, zyrtec ACT:  Asthma Control Test ACT Total Score  12/25/2022  9:10 AM 23  02/05/2021  2:17 PM 23  12/26/2020  3:40 PM 13   FeNO: Eos: 20 in Nov 2024.   I have reviewed the patient's family social and past medical history and updated as appropriate.   Past Medical History:  Diagnosis Date   Allergy    cats   Anemia    Anxiety    panic attacks   Asthma    as a child - dirty cats will cause an asthma attack   Crohn's disease (HCC)    Depression    GERD (gastroesophageal reflux disease)    Hemorrhoids    History of asthma    childhood   History of kidney stones    IBS (irritable bowel syndrome)    Pilonidal cyst    Sleep apnea    Wears glasses     Past Surgical History:  Procedure Laterality Date   ANAL RECTAL MANOMETRY N/A 07/24/2020   Procedure: ANO RECTAL MANOMETRY;  Surgeon: Sherrilyn Rist,  MD;  Location: [facility];  Service: Gastroenterology;  Laterality: N/A;   COLONOSCOPY     COLONOSCOPY WITH ESOPHAGOGASTRODUODENOSCOPY (EGD)  x2  last one 2015   KNEE ARTHROSCOPY Left 03/30/2020   LIPOMA EXCISION N/A 08/28/2022   Procedure: EXCISION OF SUBCUTANEOUS CYST UPPER BACK;  Surgeon: Berna Bue, MD;  Location: [facility];  Service: General;  Laterality: N/A;   PILONIDAL CYST EXCISION N/A 07/18/2017   Procedure: CYST EXCISION PILONIDAL;  Surgeon: Berna Bue, MD;  Location: [facility];  Service: General;  Laterality: N/A;   UPPER GASTROINTESTINAL ENDOSCOPY     WISDOM TOOTH EXTRACTION  2015    Family History  Problem Relation Age of Onset   Asthma Mother    Irritable bowel syndrome Mother    Endometriosis Mother    Colon cancer Mother    COPD Mother    Hypertension Father    Colon polyps Father    Endometriosis Sister    Irritable bowel syndrome Sister    Diverticulitis Sister    Heart attack Maternal Grandmother    Breast cancer Paternal Grandfather    Colon cancer Maternal Uncle    Liver cancer Neg Hx    Rectal cancer Neg Hx    Pancreatic cancer Neg Hx  Stomach cancer Neg Hx    Esophageal cancer Neg Hx     Social History   Occupational History   Not on file  Tobacco Use   Smoking status: Former    Current packs/day: 0.00    Average packs/day: 0.5 packs/day for 5.0 years (2.5 ttl pk-yrs)    Types: Cigarettes, E-cigarettes    Start date: 05/15/2012    Quit date: 05/15/2017    Years since quitting: 5.6   Smokeless tobacco: Never   Tobacco comments:    07-15-2017  per pt quit smoking cig. 05-15-2017 but occasionally vapes  Vaping Use   Vaping status: Some Days  Substance and Sexual Activity   Alcohol use: Not Currently    Comment: rarely   Drug use: No   Sexual activity: Yes    Birth control/protection: None    Comment: Last menstral cycle approximately 2 weeks ago "Late August".  Pt denies being pregnant.     Physical  Exam: Blood pressure 112/64, pulse 71, height 5\' 6"  (1.676 m), weight 160 lb 9.6 oz (72.8 kg), SpO2 99%. Body mass index is 25.92 kg/m.   Gen:      No acute distress ENT:  enlarged tonsils +cobblestoning Lungs:    ctab no wheeze or crackles CV:         RRR no mrg   Data Reviewed: Imaging:  PFTs:     Latest Ref Rng & Units 01/31/2021    3:49 PM  PFT Results  FVC-Pre L 2.94   FVC-Predicted Pre % 88   FVC-Post L 2.92   FVC-Predicted Post % 88   Pre FEV1/FVC % % 88   Post FEV1/FCV % % 88   FEV1-Pre L 2.58   FEV1-Predicted Pre % 89   FEV1-Post L 2.57   DLCO uncorrected ml/min/mmHg 20.41   DLCO UNC% % 104   DLCO corrected ml/min/mmHg 20.41   DLCO COR %Predicted % 104   DLVA Predicted % 102   TLC L 4.28   TLC % Predicted % 96   RV % Predicted % 103    I have personally reviewed the patient's PFTs and normal pulmonary function.   Labs: Lab Results  Component Value Date   WBC 11.9 (H) 12/24/2022   HGB 11.3 (L) 12/24/2022   HCT 36.1 12/24/2022   MCV 72.9 (L) 12/24/2022   PLT 455.0 (H) 12/24/2022   Lab Results  Component Value Date   NA 135 09/08/2022   K 3.8 09/08/2022   CL 103 09/08/2022   CO2 23 09/08/2022    Immunization status: Immunization History  Administered Date(s) Administered   DTaP 04/03/1995, 08/19/1995, 06/07/1996, 05/09/2000   H1N1 01/01/2008   HIB (PRP-OMP) 04/03/1995, 08/19/1995, 06/07/1996   HPV Quadrivalent 08/29/2006, 10/31/2006, 01/01/2008   Hepatitis A 12/20/2008, 09/13/2010   Hepatitis A, Adult 12/20/2008, 09/13/2010   Hepatitis A, Ped/Adol-2 Dose 12/20/2008, 09/13/2010   Hepatitis B, PED/ADOLESCENT 05/19/94, 03/03/1995, 04/03/1995, 08/19/1995, 06/07/1996, 08/29/2006   IPV 04/03/1995, 08/19/1995, 06/07/1996, 05/09/2000   Influenza Split 12/20/2008, 04/16/2012   Influenza,inj,Quad PF,6+ Mos 11/18/2020   Influenza,inj,quad, With Preservative 12/15/2012   Influenza-Unspecified 12/15/2012   MMR 06/24/1996, 05/09/2000    Meningococcal Conjugate 07/30/2006, 09/13/2010   Moderna Sars-Covid-2 Vaccination 10/14/2019, 11/04/2019   Pneumococcal Conjugate-13 11/26/2019   Pneumococcal Polysaccharide-23 10/16/2020   Tdap 07/30/2006   Varicella 05/09/2000, 07/30/2006   Zoster Recombinant(Shingrix) 08/23/2021      Assessment:   Moderate persistent asthma, controlled OSA on CPAP, controlled Chronic allergic rhinitis, not well controlled  Plan/Recommendations:  Continue CPAP. I will refer you to ENT to evaluate your tonsil to see if removing those would cure your sleep apnea. They can also speak with you about the inspire device.   Continue advair 1 puff twice daily, gargle after use.   Continue the acid reflux medication.  Blood work for allergies today.   Dymista (combination fluticasone and azelastine) spray- 1 spray on each side of your nose twice a day for first week, then 1 spray on each side.   Return to Care: Return in about 6 months (around 06/24/2023).   Durel Salts, MD Pulmonary and Critical Care Medicine [facility] Office:[phone]

## 2022-12-25 NOTE — Patient Instructions (Addendum)
It was a pleasure to see you today!  Please schedule follow up scheduled with myself in 6 months.  If my schedule is not open yet, we will contact you with a reminder closer to that time. Please call [phone] if you haven't heard from [place] a month before, and always call us sooner if issues or concerns arise. You can also send [place] a message through MyChart, but but aware that this is not to be used for urgent issues and it may take up to 5-7 days to receive a reply. Please be aware that you will likely be able to view your results before I have a chance to respond to them. Please give [place] 5 business days to respond to any non-urgent results.    Continue CPAP. I will refer you to ENT to evaluate your tonsil to see if removing those would cure your sleep apnea.   Continue advair 1 puff twice daily, gargle after use.   Continue the acid reflux medication.  Blood work for allergies today.   Dymista (combination fluticasone and azelastine) spray- 1 spray on each side of your nose twice a day for first week, then 1 spray on each side.   Instructions for use: If you also use a saline nasal spray or rinse, use that first. Position the head with the chin slightly tucked. Use the right hand to spray into the left nostril and the right hand to spray into the left nostril.   Point the bottle away from the septum of your nose (cartilage that divides the two sides of your nose).  Hold the nostril closed on the opposite side from where you will spray Spray once and gently sniff to pull the medicine into the higher parts of your nose.  Don't sniff too hard as the medicine will drain down the back of your throat instead. Repeat with a second spray on the same side if prescribed. Repeat on the other side of your nose.

## 2022-12-26 LAB — RESPIRATORY ALLERGY PROFILE REGION II ~~LOC~~

## 2022-12-26 LAB — INTERPRETATION:

## 2022-12-27 ENCOUNTER — Other Ambulatory Visit: Payer: Self-pay

## 2022-12-27 ENCOUNTER — Telehealth: Payer: Self-pay | Admitting: Gastroenterology

## 2022-12-27 DIAGNOSIS — K50811 Crohn's disease of both small and large intestine with rectal bleeding: Secondary | ICD-10-CM

## 2022-12-27 DIAGNOSIS — K625 Hemorrhage of anus and rectum: Secondary | ICD-10-CM

## 2022-12-27 DIAGNOSIS — R1032 Left lower quadrant pain: Secondary | ICD-10-CM

## 2022-12-27 DIAGNOSIS — R197 Diarrhea, unspecified: Secondary | ICD-10-CM

## 2022-12-27 MED ORDER — PREDNISONE 10 MG PO TABS: ORAL_TABLET | ORAL | 0 refills | Status: AC

## 2022-12-27 NOTE — Telephone Encounter (Signed)
Spoke to pt. Documented in result notes.  Pt verbalized understanding with all questions answered.

## 2022-12-27 NOTE — Telephone Encounter (Signed)
Inbound call from patient, states she has not received any results in regards to her "Diatherix. Would you to follow up on when results will be notified to her.

## 2022-12-27 NOTE — Telephone Encounter (Signed)
Spoke with Pt.  Documented in result notes 

## 2022-12-30 ENCOUNTER — Encounter: Payer: Self-pay | Admitting: Gastroenterology

## 2022-12-30 NOTE — Addendum Note (Signed)
Addended by: Charlie Pitter on: 12/30/2022 01:17 PM   Modules accepted: Orders

## 2022-12-31 ENCOUNTER — Telehealth: Payer: Self-pay | Admitting: Internal Medicine

## 2022-12-31 ENCOUNTER — Telehealth: Payer: Self-pay | Admitting: Pharmacy Technician

## 2022-12-31 NOTE — Telephone Encounter (Signed)
Received a letter asking for additional info. Indexed to pt's chart.

## 2022-12-31 NOTE — Telephone Encounter (Signed)
Needs Clinical Information

## 2022-12-31 NOTE — Telephone Encounter (Signed)
Will send to Dr Irving Burton nurse

## 2023-01-01 ENCOUNTER — Other Ambulatory Visit: Payer: Self-pay

## 2023-01-01 MED ORDER — FLUTICASONE-SALMETEROL 250-50 MCG/ACT IN AEPB: 1.0000 | INHALATION_SPRAY | Freq: Two times a day (BID) | RESPIRATORY_TRACT | 1 refills | Status: DC

## 2023-01-01 MED ORDER — MONTELUKAST SODIUM 10 MG PO TABS: 10.0000 mg | ORAL_TABLET | Freq: Every day | ORAL | 3 refills | Status: DC

## 2023-01-02 ENCOUNTER — Encounter: Payer: Self-pay | Admitting: Gastroenterology

## 2023-01-02 ENCOUNTER — Telehealth: Payer: Self-pay

## 2023-01-02 NOTE — Telephone Encounter (Signed)
Dr. Myrtie Neither, patient will be scheduled as soon as possible.  Auth Submission: NO AUTH NEEDED Site of care: Site of care: [facility] Payer: [employer] Medication & CPT/J Code(s) submitted: Venofer (Iron Sucrose) J1756 Route of submission (phone, fax, portal):  Phone # Fax # Auth type: Buy/Bill PB Units/visits requested: 200mg  x 5 doses Reference number:  Approval from: 01/02/23 to 02/18/23

## 2023-01-03 ENCOUNTER — Encounter: Payer: Self-pay | Admitting: Internal Medicine

## 2023-01-03 NOTE — Telephone Encounter (Signed)
[facility] please refer patient to in-network ENT.

## 2023-01-03 NOTE — Telephone Encounter (Signed)
Please see mychart sent by pt.

## 2023-01-05 DIAGNOSIS — G4733 Obstructive sleep apnea (adult) (pediatric): Secondary | ICD-10-CM | POA: Diagnosis not present

## 2023-01-07 ENCOUNTER — Other Ambulatory Visit: Payer: Self-pay | Admitting: Gastroenterology

## 2023-01-07 DIAGNOSIS — G4733 Obstructive sleep apnea (adult) (pediatric): Secondary | ICD-10-CM | POA: Diagnosis not present

## 2023-01-08 DIAGNOSIS — R300 Dysuria: Secondary | ICD-10-CM | POA: Diagnosis not present

## 2023-01-10 ENCOUNTER — Encounter: Payer: Self-pay | Admitting: Gastroenterology

## 2023-01-12 ENCOUNTER — Encounter: Payer: Self-pay | Admitting: Gastroenterology

## 2023-01-13 NOTE — Telephone Encounter (Signed)
Marissa Morales,    I have a 10:40 clinic appointment available tomorrow.  Please ask her to come in at 10:30 for that visit.  Also, please communicate with [name], who is the pharmacist for the infusion center.  They handle the IV iron treatments and all authorizations for it.  - H. Danis

## 2023-01-13 NOTE — Telephone Encounter (Signed)
Spoke with [employer] and they do not show any requests for clinical information regarding this patient. They also do not have anything related to Dr. Celine Mans. Closing message.

## 2023-01-13 NOTE — Telephone Encounter (Signed)
Pt made aware of Dr. Myrtie Neither recommendations: Pt was scheduled for an office visit tomorrow at 10:40 with Dr. Myrtie Neither. Pt made aware.  Please assist with PA for iron infusions.

## 2023-01-14 ENCOUNTER — Emergency Department (HOSPITAL_COMMUNITY): Payer: 59

## 2023-01-14 ENCOUNTER — Encounter (HOSPITAL_COMMUNITY): Payer: Self-pay

## 2023-01-14 ENCOUNTER — Observation Stay (HOSPITAL_COMMUNITY)
Admission: EM | Admit: 2023-01-14 | Discharge: 2023-01-15 | Disposition: A | Discharge status: Home / Self Care | Payer: 59 | Attending: Internal Medicine | Admitting: Internal Medicine

## 2023-01-14 ENCOUNTER — Encounter: Payer: Self-pay | Admitting: Gastroenterology

## 2023-01-14 ENCOUNTER — Ambulatory Visit (INDEPENDENT_AMBULATORY_CARE_PROVIDER_SITE_OTHER): Payer: 59 | Admitting: Gastroenterology

## 2023-01-14 ENCOUNTER — Other Ambulatory Visit: Payer: Self-pay

## 2023-01-14 VITALS — BP 130/80 | HR 76 | Ht 61.0 in | Wt 157.0 lb

## 2023-01-14 DIAGNOSIS — K50119 Crohn's disease of large intestine with unspecified complications: Secondary | ICD-10-CM

## 2023-01-14 DIAGNOSIS — R161 Splenomegaly, not elsewhere classified: Secondary | ICD-10-CM | POA: Diagnosis not present

## 2023-01-14 DIAGNOSIS — R110 Nausea: Secondary | ICD-10-CM | POA: Diagnosis not present

## 2023-01-14 DIAGNOSIS — R112 Nausea with vomiting, unspecified: Secondary | ICD-10-CM | POA: Diagnosis not present

## 2023-01-14 DIAGNOSIS — R109 Unspecified abdominal pain: Secondary | ICD-10-CM | POA: Diagnosis not present

## 2023-01-14 DIAGNOSIS — Z7960 Long term (current) use of unspecified immunomodulators and immunosuppressants: Secondary | ICD-10-CM | POA: Diagnosis not present

## 2023-01-14 DIAGNOSIS — R1032 Left lower quadrant pain: Secondary | ICD-10-CM

## 2023-01-14 DIAGNOSIS — Z87891 Personal history of nicotine dependence: Secondary | ICD-10-CM | POA: Diagnosis not present

## 2023-01-14 DIAGNOSIS — K648 Other hemorrhoids: Secondary | ICD-10-CM | POA: Diagnosis not present

## 2023-01-14 DIAGNOSIS — J4520 Mild intermittent asthma, uncomplicated: Secondary | ICD-10-CM | POA: Insufficient documentation

## 2023-01-14 DIAGNOSIS — Z79899 Other long term (current) drug therapy: Secondary | ICD-10-CM | POA: Insufficient documentation

## 2023-01-14 DIAGNOSIS — K50811 Crohn's disease of both small and large intestine with rectal bleeding: Secondary | ICD-10-CM | POA: Diagnosis not present

## 2023-01-14 DIAGNOSIS — K5090 Crohn's disease, unspecified, without complications: Secondary | ICD-10-CM | POA: Diagnosis not present

## 2023-01-14 DIAGNOSIS — K625 Hemorrhage of anus and rectum: Secondary | ICD-10-CM

## 2023-01-14 LAB — URINALYSIS, ROUTINE W REFLEX MICROSCOPIC
Bilirubin Urine: NEGATIVE
Glucose, UA: NEGATIVE mg/dL
Ketones, ur: NEGATIVE mg/dL
Leukocytes,Ua: NEGATIVE
Nitrite: NEGATIVE
Protein, ur: NEGATIVE mg/dL
Specific Gravity, Urine: 1.005 (ref 1.005–1.030)
pH: 8 (ref 5.0–8.0)

## 2023-01-14 LAB — COMPREHENSIVE METABOLIC PANEL
ALT: 11 U/L (ref 0–44)
AST: 14 U/L — ABNORMAL LOW (ref 15–41)
Albumin: 4 g/dL (ref 3.5–5.0)
Alkaline Phosphatase: 68 U/L (ref 38–126)
Anion gap: 10 (ref 5–15)
BUN: 10 mg/dL (ref 6–20)
CO2: 21 mmol/L — ABNORMAL LOW (ref 22–32)
Calcium: 8.9 mg/dL (ref 8.9–10.3)
Chloride: 104 mmol/L (ref 98–111)
Creatinine, Ser: 0.6 mg/dL (ref 0.44–1.00)
GFR, Estimated: >60 mL/min (ref >60)
Glucose, Bld: 116 mg/dL — ABNORMAL HIGH (ref 70–99)
Potassium: 3.6 mmol/L (ref 3.5–5.1)
Sodium: 135 mmol/L (ref 135–145)
Total Bilirubin: 0.6 mg/dL (ref <1.2)
Total Protein: 7.3 g/dL (ref 6.5–8.1)

## 2023-01-14 LAB — LIPASE, BLOOD: Lipase: 40 U/L (ref 11–51)

## 2023-01-14 LAB — CBC
HCT: 38.7 % (ref 36.0–46.0)
Hemoglobin: 12.1 g/dL (ref 12.0–15.0)
MCH: 24 pg — ABNORMAL LOW (ref 26.0–34.0)
MCHC: 31.3 g/dL (ref 30.0–36.0)
MCV: 76.6 fL — ABNORMAL LOW (ref 80.0–100.0)
Platelets: 470 10*3/uL — ABNORMAL HIGH (ref 150–400)
RBC: 5.05 MIL/uL (ref 3.87–5.11)
RDW: 18.2 % — ABNORMAL HIGH (ref 11.5–15.5)
WBC: 13 10*3/uL — ABNORMAL HIGH (ref 4.0–10.5)
nRBC: 0 % (ref 0.0–0.2)

## 2023-01-14 LAB — HCG, SERUM, QUALITATIVE: Preg, Serum: NEGATIVE

## 2023-01-14 LAB — SEDIMENTATION RATE: Sed Rate: 5 mm/h (ref 0–22)

## 2023-01-14 MED ORDER — PREDNISONE 5 MG PO TABS: 10.0000 mg | ORAL_TABLET | Freq: Every day | ORAL | Status: DC

## 2023-01-14 MED ORDER — SODIUM CHLORIDE 0.9 % IV BOLUS
1000.0000 mL | Freq: Once | INTRAVENOUS | Status: AC
  Administered 2023-01-14: 1000 mL via INTRAVENOUS

## 2023-01-14 MED ORDER — MONTELUKAST SODIUM 10 MG PO TABS
10.0000 mg | ORAL_TABLET | Freq: Every day | ORAL | Status: DC
  Administered 2023-01-15: 10 mg via ORAL
  Filled 2023-01-14: qty 1

## 2023-01-14 MED ORDER — MOMETASONE FURO-FORMOTEROL FUM 200-5 MCG/ACT IN AERO: 2.0000 | INHALATION_SPRAY | Freq: Two times a day (BID) | RESPIRATORY_TRACT | Status: DC

## 2023-01-14 MED ORDER — HYDROMORPHONE HCL 1 MG/ML IJ SOLN
0.5000 mg | Freq: Once | INTRAMUSCULAR | Status: AC
  Administered 2023-01-14: 0.5 mg via INTRAVENOUS
  Filled 2023-01-14: qty 1

## 2023-01-14 MED ORDER — LORATADINE 10 MG PO TABS
10.0000 mg | ORAL_TABLET | Freq: Every day | ORAL | Status: DC
  Administered 2023-01-14: 10 mg via ORAL
  Filled 2023-01-14 (×2): qty 1

## 2023-01-14 MED ORDER — ONDANSETRON HCL 4 MG/2ML IJ SOLN: 4.0000 mg | Freq: Three times a day (TID) | INTRAMUSCULAR | Status: DC | PRN

## 2023-01-14 MED ORDER — ONDANSETRON 4 MG PO TBDP
4.0000 mg | ORAL_TABLET | Freq: Once | ORAL | Status: AC
  Administered 2023-01-14: 4 mg via ORAL
  Filled 2023-01-14: qty 1

## 2023-01-14 MED ORDER — TRAZODONE HCL 50 MG PO TABS
50.0000 mg | ORAL_TABLET | Freq: Every day | ORAL | Status: DC
  Administered 2023-01-15: 50 mg via ORAL
  Filled 2023-01-14: qty 1

## 2023-01-14 MED ORDER — PREDNISONE 20 MG PO TABS: 20.0000 mg | ORAL_TABLET | Freq: Every day | ORAL | Status: DC

## 2023-01-14 MED ORDER — OXYCODONE-ACETAMINOPHEN 5-325 MG PO TABS
1.0000 | ORAL_TABLET | Freq: Four times a day (QID) | ORAL | Status: DC | PRN
  Administered 2023-01-14: 2 via ORAL
  Administered 2023-01-15: 1 via ORAL
  Filled 2023-01-14: qty 2
  Filled 2023-01-14: qty 1

## 2023-01-14 MED ORDER — IOHEXOL 300 MG/ML  SOLN
100.0000 mL | Freq: Once | INTRAMUSCULAR | Status: AC | PRN
  Administered 2023-01-14: 100 mL via INTRAVENOUS

## 2023-01-14 NOTE — H&P (Signed)
History and Physical    Patient: Marissa Morales WUJ:[id number] DOB: 01/26/1996 DOA: 01/14/2023 DOS: the patient was seen and examined on 01/14/2023 PCP: Stamey, Verda Cumins, FNP  Patient coming from: Home  Chief Complaint:  Chief Complaint  Patient presents with   Abdominal Pain   HPI: Marissa Morales is a 27 y.o. female with PMH of chron's disease, C. difficile, anal fissure, asthma, allergy, anemia, anxiety, depression and constipation sent to ED from GI office for CT abdomen and pelvis and C. difficile testing after she presented there with progressive LLQ pain, nausea and vomiting.   Patient has been on steroid taper for Crohn's disease, currently on 20 mg daily.  She reports progressive LLQ pain for about 3 weeks and nausea and vomiting for 4 to 5 days.  Reports only 1 episode of emesis.  She was nauseous in ED but treated with Zofran and resolved.   She denies fever or chills.  She reports diarrhea after eating that she describes as loose stool. Reports improvement in bleeding after starting steroids.  Reports compliance with prednisone.  Denies runny nose, sore throat, chest pain, shortness of breath, cough or UTI symptoms.  Patient denies smoking cigarette, drinking alcohol recreational drug use.  Likes to have cardiopulmonary resuscitation in event of sudden cardiopulmonary arrest.  In ED, vital stable.  CMP without significant finding.  WBC 13.  No differential.  UA without significant finding CT abdomen and pelvis without acute finding but mild hepatic steatosis and a stable borderline splenomegaly.  Patient has not had bowel movement to provide some report for C. difficile testing in ED.  Evaluated by GI while in ED. Per GI, consideration of IV steroid and diagnostic sigmoidoscopy versus colonoscopy after C. difficile testing   Review of Systems: As mentioned in the history of present illness. All other systems reviewed and are negative. Past Medical History:  Diagnosis Date   Allergy     cats   Anemia    Anxiety    panic attacks   Asthma    as a child - dirty cats will cause an asthma attack   Crohn's disease (HCC)    Depression    GERD (gastroesophageal reflux disease)    Hemorrhoids    History of asthma    childhood   History of kidney stones    IBS (irritable bowel syndrome)    Pilonidal cyst    Sleep apnea    Wears glasses    Past Surgical History:  Procedure Laterality Date   ANAL RECTAL MANOMETRY N/A 07/24/2020   Procedure: ANO RECTAL MANOMETRY;  Surgeon: Sherrilyn Rist, MD;  Location: [facility];  Service: Gastroenterology;  Laterality: N/A;   COLONOSCOPY     COLONOSCOPY WITH ESOPHAGOGASTRODUODENOSCOPY (EGD)  x2  last one 2015   KNEE ARTHROSCOPY Left 03/30/2020   LIPOMA EXCISION N/A 08/28/2022   Procedure: EXCISION OF SUBCUTANEOUS CYST UPPER BACK;  Surgeon: Berna Bue, MD;  Location: [facility];  Service: General;  Laterality: N/A;   PILONIDAL CYST EXCISION N/A 07/18/2017   Procedure: CYST EXCISION PILONIDAL;  Surgeon: Berna Bue, MD;  Location: [facility];  Service: General;  Laterality: N/A;   UPPER GASTROINTESTINAL ENDOSCOPY     WISDOM TOOTH EXTRACTION  2015   Social History:  reports that she quit smoking about 5 years ago. Her smoking use included cigarettes and e-cigarettes. She started smoking about 10 years ago. She has a 2.5 pack-year smoking history. She has never used smokeless tobacco. She reports  that she does not currently use alcohol. She reports that she does not use drugs.  Allergies  Allergen Reactions   Bactrim [Sulfamethoxazole-Trimethoprim] Nausea And Vomiting   Bismuth Subsalicylate Nausea And Vomiting   Bismuth-Containing Compounds Nausea And Vomiting   Cefpodoxime Nausea And Vomiting and Other (See Comments)    Childhood rx - GI upset  Childhood   Doxycycline Nausea And Vomiting   Silicone Other (See Comments)    BURNS AND PULLS SKIN OFF   Vancomycin Itching and Other (See Comments)     Severe pruritus of scalp, flushing  Red Man Syndrome    Family History  Problem Relation Age of Onset   Asthma Mother    Irritable bowel syndrome Mother    Endometriosis Mother    Colon cancer Mother    COPD Mother    Hypertension Father    Colon polyps Father    Endometriosis Sister    Irritable bowel syndrome Sister    Diverticulitis Sister    Heart attack Maternal Grandmother    Breast cancer Paternal Grandfather    Colon cancer Maternal Uncle    Liver cancer Neg Hx    Rectal cancer Neg Hx    Pancreatic cancer Neg Hx    Stomach cancer Neg Hx    Esophageal cancer Neg Hx     Prior to Admission medications   Medication Sig Start Date End Date Taking? Authorizing Provider  albuterol (PROVENTIL) (2.5 MG/3ML) 0.083% nebulizer solution Take 2.5 mg by nebulization every 6 (six) hours as needed for wheezing or shortness of breath.    [provider]  albuterol (VENTOLIN HFA) 108 (90 Base) MCG/ACT inhaler Inhale 1-2 puffs into the lungs every 6 (six) hours as needed for wheezing or shortness of breath. 03/15/20   [provider]  Azelastine-Fluticasone 137-50 MCG/ACT SUSP Place 1 spray into the nose daily. 12/25/22   Charlott Holler, MD  cephALEXin (KEFLEX) 500 MG capsule Take 1 capsule (500 mg total) by mouth 3 (three) times daily. 09/09/22   Charlynne Pander, MD  cetirizine (ZYRTEC) 10 MG tablet Take 10 mg by mouth daily. 11/28/20   [provider]  clindamycin (CLEOCIN T) 1 % SWAB Apply 1 Application topically 2 (two) times daily. 08/14/21   [provider]  clobetasol cream (TEMOVATE) 0.05 % Apply 1 Application topically 2 (two) times daily as needed (irritation). 08/13/21   [provider]  diclofenac Sodium (VOLTAREN) 1 % GEL Apply 2-3 g topically 4 (four) times daily as needed (pain). 12/20/20   [provider]  dicyclomine (BENTYL) 10 MG capsule TAKE 1 CAPSULE 2-3 TIMES A DAY AS NEEDED FOR ABDOMINAL CRAMPS 01/21/22   Sherrilyn Rist, MD  fluticasone-salmeterol (ADVAIR DISKUS) 250-50 MCG/ACT AEPB Inhale 1 puff into the lungs in the morning and at bedtime. 01/01/23   Charlott Holler, MD  hydrocortisone cream 1 % Apply to affected area 2 times daily Patient taking differently: Apply 1 Application topically 2 (two) times daily as needed for itching. 03/24/21   Achille Rich, PA-C  hydrOXYzine (ATARAX) 25 MG tablet Take 25 mg by mouth daily as needed for anxiety (panic attack). 02/19/22   [provider]  linaclotide Karlene Einstein) 72 MCG capsule Take 1 capsule (72 mcg total) by mouth daily before breakfast. 05/15/21   Danis, Andreas Blower, MD  lubiprostone (AMITIZA) 8 MCG capsule TAKE 1 CAPSULE (8 MCG TOTAL) BY MOUTH 2 (TWO) TIMES DAILY WITH A MEAL. 01/08/22   [name],  MD  montelukast (SINGULAIR) 10 MG tablet Take 1 tablet (10 mg total) by mouth at bedtime. 01/01/23   Charlott Holler, MD  mupirocin ointment (BACTROBAN) 2 % Apply 1 Application topically 2 (two) times daily. 08/13/21   [provider]  NON FORMULARY Pt uses a cpap nightly    [provider]  omeprazole (PRILOSEC) 40 MG capsule Take 1 capsule (40 mg total) by mouth daily. 11/11/22   [name], MD  ondansetron (ZOFRAN) 4 MG tablet TAKE 1 TABLET BY MOUTH EVERY 8 HOURS AS NEEDED FOR NAUSEA AND VOMITING 02/25/22   Sherrilyn Rist, MD  oxyCODONE-acetaminophen (PERCOCET) 5-325 MG tablet Take 1-2 tablets by mouth every 6 (six) hours as needed for severe pain (pain score 7-10). 12/24/22   Sherrilyn Rist, MD  predniSONE (DELTASONE) 10 MG tablet Take 4 tablets (40 mg total) by mouth daily with breakfast for 7 days, THEN 3 tablets (30 mg total) daily with breakfast for 7 days, THEN 2 tablets (20 mg total) daily with breakfast for 7 days, THEN 1 tablet (10 mg total) daily with breakfast for 7 days. 12/27/22 01/24/23  Sherrilyn Rist, MD  Risankizumab-rzaa [facility]) 180 MG/1.2ML SOCT Inject (1.2 ml) under the skin every 8 weeks 03/28/22    Sherrilyn Rist, MD  sertraline (ZOLOFT) 50 MG tablet Take 50 mg by mouth daily. 03/01/20   [provider]  traZODone (DESYREL) 50 MG tablet TAKE 1 TABLET BY MOUTH EVERYDAY AT BEDTIME 01/07/23   Sherrilyn Rist, MD    Physical Exam: Vitals:   01/14/23 1725 01/14/23 1927 01/14/23 1930 01/14/23 2015  BP:  125/87 124/83 (!) 139/95  Pulse: 64 (!) 59 61 65  Resp:  18    Temp: 97.8 F (36.6 C) 98.4 F (36.9 C)    TempSrc: Oral Oral    SpO2: 98% 96% 97% 99%   GENERAL: No apparent distress.  Nontoxic. HEENT: MMM.  Vision and hearing grossly intact.  NECK: Supple.  No apparent JVD.  RESP:  No IWOB.  Fair aeration bilaterally. CVS:  RRR. Heart sounds normal.  ABD/GI/GU: BS+. Abd soft.  Mild LLQ tenderness.  No rebound or guarding. MSK/EXT:   No apparent deformity. Moves extremities. No edema.  SKIN: no apparent skin lesion or wound NEURO: Awake and alert. Oriented appropriately.  No apparent focal neuro deficit. PSYCH: Calm. Normal affect.  Data Reviewed: See HPI  Assessment and Plan: Nausea/vomiting/diarrhea/LLQ pain/chronic disease-Patient reports progressive LLQ pain for about 3 weeks and loose stool after each meals.  One episode of emesis in the last 4 to 5 days but ongoing nausea.  She reports using Percocet at home.  Abdominal exam and CT abdomen and pelvis benign.  She has no fever.  Low suspicion for C. Difficile. -Defer steroid to GI.  She is currently on steroid taper at 20 mg daily -Follow C. difficile if able to provide appropriate sample -Continue home Percocet for pain -Zofran for nausea and emesis -Check CRP and ESR -Clear liquid diet  Mild intermittent asthma/environmental allergy -Continue home meds  Anxiety and depression -Continue home meds after med rec    Advance Care Planning:   Code Status: Full Code discussed with patient  Consults: [facility]  Family Communication: None at bedside  Severity of Illness: The appropriate patient status  for this patient is OBSERVATION. Observation status is judged to be reasonable and necessary in order to provide the required intensity of service to ensure  the patient's safety. The patient's presenting symptoms, physical exam findings, and initial radiographic and laboratory data in the context of their medical condition is felt to place them at decreased risk for further clinical deterioration. Furthermore, it is anticipated that the patient will be medically stable for discharge from the hospital within 2 midnights of admission.   Author: Almon Hercules, MD 01/14/2023 8:43 PM  For on call review [email or web address].

## 2023-01-14 NOTE — ED Triage Notes (Addendum)
Left sided abd pain that is worse with eating and n/v/d x2 weeks.  Hx chron's.  Pt reports was having rectal bleeding but Started prednisone taper on 11/5 that she currently still is on.  prednisone improved rectal bleeding per patient.

## 2023-01-14 NOTE — Patient Instructions (Signed)
_______________________________________________________  If your blood pressure at your visit was 140/90 or greater, please contact your primary care physician to follow up on this.  _______________________________________________________  If you are age 28 or older, your body mass index should be between 23-30. Your Body mass index is 29.66 kg/m. If this is out of the aforementioned range listed, please consider follow up with your Primary Care Provider.  If you are age 20 or younger, your body mass index should be between 19-25. Your Body mass index is 29.66 kg/m. If this is out of the aformentioned range listed, please consider follow up with your Primary Care Provider.   ________________________________________________________  The [place] [facility] providers would like to encourage you to use [facility] to communicate with providers for non-urgent requests or questions.  Due to long hold times on the telephone, sending your provider a message by [facility] may be a faster and more efficient way to get a response.  Please allow 48 business hours for a response.  Please remember that this is for non-urgent requests.  _______________________________________________________ It was a pleasure to see you today!  Thank you for trusting me with your gastrointestinal care!

## 2023-01-14 NOTE — ED Notes (Signed)
C Diff not collected.  Pt has had no BM/diarrhea since being here today

## 2023-01-14 NOTE — ED Provider Notes (Signed)
[place] EMERGENCY DEPARTMENT AT [facility] Provider Note   CSN: [id number] Arrival date & time: 01/14/23  1259     History  Chief Complaint  Patient presents with   Abdominal Pain    Marissa Morales is a 27 y.o. female.   Abdominal Pain Patient presents with nausea vomiting diarrhea abdominal pain.  Also rectal bleeding.  Has been on steroids by gastroenterology.  History of Crohn's disease.  Has some pain.  Has as needed oxycodone at home.  Reportedly saw GI today who sent  her in for CT scan, blood work, fluids and pain medicine.  Although did not come with paperwork.  In note is not completed yet in the computer.    Past Medical History:  Diagnosis Date   Allergy    cats   Anemia    Anxiety    panic attacks   Asthma    as a child - dirty cats will cause an asthma attack   Crohn's disease (HCC)    Depression    GERD (gastroesophageal reflux disease)    Hemorrhoids    History of asthma    childhood   History of kidney stones    IBS (irritable bowel syndrome)    Pilonidal cyst    Sleep apnea    Wears glasses     Home Medications Prior to Admission medications   Medication Sig Start Date End Date Taking? Authorizing Provider  albuterol (PROVENTIL) (2.5 MG/3ML) 0.083% nebulizer solution Take 2.5 mg by nebulization every 6 (six) hours as needed for wheezing or shortness of breath.    [provider]  albuterol (VENTOLIN HFA) 108 (90 Base) MCG/ACT inhaler Inhale 1-2 puffs into the lungs every 6 (six) hours as needed for wheezing or shortness of breath. 03/15/20   [provider]  Azelastine-Fluticasone 137-50 MCG/ACT SUSP Place 1 spray into the nose daily. 12/25/22   Charlott Holler, MD  cephALEXin (KEFLEX) 500 MG capsule Take 1 capsule (500 mg total) by mouth 3 (three) times daily. 09/09/22   Charlynne Pander, MD  cetirizine (ZYRTEC) 10 MG tablet Take 10 mg by mouth daily. 11/28/20   [provider]  clindamycin (CLEOCIN T) 1 %  SWAB Apply 1 Application topically 2 (two) times daily. 08/14/21   [provider]  clobetasol cream (TEMOVATE) 0.05 % Apply 1 Application topically 2 (two) times daily as needed (irritation). 08/13/21   [provider]  diclofenac Sodium (VOLTAREN) 1 % GEL Apply 2-3 g topically 4 (four) times daily as needed (pain). 12/20/20   [provider]  dicyclomine (BENTYL) 10 MG capsule TAKE 1 CAPSULE 2-3 TIMES A DAY AS NEEDED FOR ABDOMINAL CRAMPS 01/21/22   Sherrilyn Rist, MD  fluticasone-salmeterol (ADVAIR DISKUS) 250-50 MCG/ACT AEPB Inhale 1 puff into the lungs in the morning and at bedtime. 01/01/23   Charlott Holler, MD  hydrocortisone cream 1 % Apply to affected area 2 times daily Patient taking differently: Apply 1 Application topically 2 (two) times daily as needed for itching. 03/24/21   Achille Rich, PA-C  hydrOXYzine (ATARAX) 25 MG tablet Take 25 mg by mouth daily as needed for anxiety (panic attack). 02/19/22   [provider]  linaclotide Karlene Einstein) 72 MCG capsule Take 1 capsule (72 mcg total) by mouth daily before breakfast. 05/15/21   Danis, Andreas Blower, MD  lubiprostone (AMITIZA) 8 MCG capsule TAKE 1 CAPSULE (8 MCG TOTAL) BY MOUTH 2 (TWO) TIMES DAILY WITH A MEAL. 01/08/22   Danis,  Starr Lake III, MD  montelukast (SINGULAIR) 10 MG tablet Take 1 tablet (10 mg total) by mouth at bedtime. 01/01/23   Charlott Holler, MD  mupirocin ointment (BACTROBAN) 2 % Apply 1 Application topically 2 (two) times daily. 08/13/21   [provider]  NON FORMULARY Pt uses a cpap nightly    [provider]  omeprazole (PRILOSEC) 40 MG capsule Take 1 capsule (40 mg total) by mouth daily. 11/11/22   [name], MD  ondansetron (ZOFRAN) 4 MG tablet TAKE 1 TABLET BY MOUTH EVERY 8 HOURS AS NEEDED FOR NAUSEA AND VOMITING 02/25/22   Sherrilyn Rist, MD  oxyCODONE-acetaminophen (PERCOCET) 5-325 MG tablet Take 1-2 tablets by mouth every 6 (six) hours as needed for severe  pain (pain score 7-10). 12/24/22   Sherrilyn Rist, MD  predniSONE (DELTASONE) 10 MG tablet Take 4 tablets (40 mg total) by mouth daily with breakfast for 7 days, THEN 3 tablets (30 mg total) daily with breakfast for 7 days, THEN 2 tablets (20 mg total) daily with breakfast for 7 days, THEN 1 tablet (10 mg total) daily with breakfast for 7 days. 12/27/22 01/24/23  Sherrilyn Rist, MD  Risankizumab-rzaa [facility]) 180 MG/1.2ML SOCT Inject (1.2 ml) under the skin every 8 weeks 03/28/22   Sherrilyn Rist, MD  sertraline (ZOLOFT) 50 MG tablet Take 50 mg by mouth daily. 03/01/20   [provider]  traZODone (DESYREL) 50 MG tablet TAKE 1 TABLET BY MOUTH EVERYDAY AT BEDTIME 01/07/23   Sherrilyn Rist, MD      Allergies    Bactrim [sulfamethoxazole-trimethoprim], Bismuth subsalicylate, Bismuth-containing compounds, Cefpodoxime, Doxycycline, Silicone, and Vancomycin    Review of Systems   Review of Systems  Gastrointestinal:  Positive for abdominal pain.    Physical Exam Updated Vital Signs BP 125/87   Pulse (!) 59   Temp 98.4 F (36.9 C) (Oral)   Resp 18   SpO2 96%  Physical Exam Vitals and nursing note reviewed.  HENT:     Head: Normocephalic.  Cardiovascular:     Rate and Rhythm: Regular rhythm. Tachycardia present.  Pulmonary:     Breath sounds: Normal breath sounds.  Abdominal:     Comments:   Mild diffuse abdominal tenderness.  No rebound or guarding.  No hernia palpated.  Neurological:     Mental Status: She is alert.     ED Results / Procedures / Treatments   Labs (all labs ordered are listed, but only abnormal results are displayed) Labs Reviewed  COMPREHENSIVE METABOLIC PANEL - Abnormal; Notable for the following components:      Result Value   CO2 21 (*)    Glucose, Bld 116 (*)    AST 14 (*)    All other components within normal limits  CBC - Abnormal; Notable for the following components:   WBC 13.0 (*)    MCV 76.6 (*)    MCH 24.0 (*)    RDW 18.2  (*)    Platelets 470 (*)    All other components within normal limits  URINALYSIS, ROUTINE W REFLEX MICROSCOPIC - Abnormal; Notable for the following components:   Color, Urine STRAW (*)    Hgb urine dipstick MODERATE (*)    Bacteria, UA RARE (*)    All other components within normal limits  C DIFFICILE QUICK SCREEN W PCR REFLEX    LIPASE, BLOOD  HCG, SERUM, QUALITATIVE    EKG None  Radiology CT  ABDOMEN PELVIS W CONTRAST  Result Date: 01/14/2023 CLINICAL DATA:  Crohn's disease with acute exacerbation. Left abdominal pain, nausea/vomiting/diarrhea. EXAM: CT ABDOMEN AND PELVIS WITH CONTRAST TECHNIQUE: Multidetector CT imaging of the abdomen and pelvis was performed using the standard protocol following bolus administration of intravenous contrast. RADIATION DOSE REDUCTION: This exam was performed according to the departmental dose-optimization program which includes automated exposure control, adjustment of the mA and/or kV according to patient size and/or use of iterative reconstruction technique. CONTRAST:  OMNIPAQUE IOHEXOL 300 MG/ML  SOLN COMPARISON:  09/08/2022 FINDINGS: Lower chest: No acute abnormality. Hepatobiliary: Mild focal fatty hepatic infiltration adjacent the falciform ligament. Mild background diffuse hepatic steatosis. No focal enhancing intrahepatic mass. No intra or extrahepatic biliary ductal dilation. Gallbladder unremarkable. Pancreas: Unremarkable Spleen: Stable borderline splenomegaly with the spleen measuring 13.7 cm in greatest dimension. No intrasplenic lesion identified. Splenic vein is patent. Adrenals/Urinary Tract: Adrenal glands are unremarkable. Kidneys are normal, without renal calculi, focal lesion, or hydronephrosis. Bladder is unremarkable. Stomach/Bowel: The stomach, small bowel, and large bowel are unremarkable. No evidence of obstruction or focal inflammation. The appendix is normal. No free intraperitoneal gas or fluid. Vascular/Lymphatic: No  significant vascular findings are present. No enlarged abdominal or pelvic lymph nodes. Reproductive: Uterus and bilateral adnexa are unremarkable. Other: No abdominal wall hernia or abnormality. No abdominopelvic ascites. Musculoskeletal: No acute or significant osseous findings. IMPRESSION: 1. No acute intra-abdominal pathology identified. No definite radiographic explanation for the patient's reported symptoms. 2. Mild hepatic steatosis. 3. Stable borderline splenomegaly. Electronically Signed   By: Helyn Numbers M.D.   On: 01/14/2023 19:25    Procedures Procedures    Medications Ordered in ED Medications  ondansetron (ZOFRAN-ODT) disintegrating tablet 4 mg (4 mg Oral Given 01/14/23 1434)  sodium chloride 0.9 % bolus 1,000 mL (0 mLs Intravenous Stopped 01/14/23 1840)  HYDROmorphone (DILAUDID) injection 0.5 mg (0.5 mg Intravenous Given 01/14/23 1646)  iohexol (OMNIPAQUE) 300 MG/ML solution 100 mL (100 mLs Intravenous Contrast Given 01/14/23 1741)    ED Course/ Medical Decision Making/ A&P                                 Medical Decision Making Amount and/or Complexity of Data Reviewed Labs: ordered. Radiology: ordered.  Risk Prescription drug management.   Patient with abdominal pain.  Diffuse.  Has had blood in the stool.  History of Crohn's disease.  Will get CT scan.  Blood work overall reassuring of the white count is somewhat elevated.  Blood work reassuring overall.  CT scan reassuring.  Has not been able to eat however.  Per GI plan, will admit.  Will discuss with hospitalist.        Final Clinical Impression(s) / ED Diagnoses Final diagnoses:  Crohn's disease without complication, unspecified gastrointestinal tract location [facility])    Rx / DC Orders ED Discharge Orders     None         Benjiman Core, MD 01/14/23 1943

## 2023-01-14 NOTE — Consult Note (Signed)
Brief Consultation  Referring Provider:  None    Primary Care Physician:  Stamey, Verda Cumins, FNP Primary Gastroenterologist: Dr. Myrtie Neither        Reason for Consultation:  Crohn's flare            HPI:   Marissa Morales is a 27 y.o. female with a past medical history as listed below including Crohn's disease, who was sent to the hospital today from our clinic by Dr. Myrtie Neither for a Crohn's flare.    Please see Dr. Irving Burton detailed note from today for further details.  Essentially patient has been having escalating lower abdominal pain, predominantly on the left side and some blood in her stool as well as severe postprandial pain often leading to an urge to defecate.  She had a recent course of Prednisone, initiated after negative C. difficile test but there was no noticeable improvement in her symptoms.  Also completed a 5-day course of Macrobid for suspected UTI.  She has lost 2 pounds due to the pain and decrease in appetite.  Previously Crohn's disease was identified and localized in the rectosigmoid junction with considerable improvement on Skyrizi, but now symptoms are not improving.  Crohn's history (taken from Dr. Irving Burton recent progress note): Years of marked colitis of the rectosigmoid junction, difficult to control on multiple meds, failed anti-TNF, failed Entyvio, finally evolved into more of a Crohn's-like picture including mild terminal ileitis.  Started on [place] and had complete symptomatic and mucosal remission on last colonoscopy January 2024.  Was doing well at clinic follow-up April 2024. History of anal fissure and C. difficile infection May 2024 office visit for acute symptoms of probable gastrointestinal infection,, tested negative for C. difficile. 15 mm cecal SSP removed March 2021.  Diminutive transverse colon tubular adenoma September 2022.  No polyps on January 2024 diagnostic colonoscopy, recall for polyp surveillance at January 2027   Seen 12/24/2022 for an apparent flare of  symptoms with lower abdominal/pelvic pain and bleeding reminiscent of prior flares.  Clinical details in office note of that day.  Diatherix C. difficile swab negative, and then prednisone taper started. Recurrent iron deficiency anemia believed due to her ongoing chronic disease related blood loss.  Previously intolerant of oral iron with abdominal pain and worsened chronic constipation.  IV iron ordered (which she had tolerated well in the past), though we were just notified by her that insurance declined it.  Clinical nursing will contact pharmacy staff to work on the prior authorization.  Past Medical History:  Diagnosis Date   Allergy    cats   Anemia    Anxiety    panic attacks   Asthma    as a child - dirty cats will cause an asthma attack   Crohn's disease (HCC)    Depression    GERD (gastroesophageal reflux disease)    Hemorrhoids    History of asthma    childhood   History of kidney stones    IBS (irritable bowel syndrome)    Pilonidal cyst    Sleep apnea    Wears glasses     Past Surgical History:  Procedure Laterality Date   ANAL RECTAL MANOMETRY N/A 07/24/2020   Procedure: ANO RECTAL MANOMETRY;  Surgeon: Sherrilyn Rist, MD;  Location: [facility];  Service: Gastroenterology;  Laterality: N/A;   COLONOSCOPY     COLONOSCOPY WITH ESOPHAGOGASTRODUODENOSCOPY (EGD)  x2  last one 2015   KNEE ARTHROSCOPY Left 03/30/2020   LIPOMA EXCISION N/A 08/28/2022  Procedure: EXCISION OF SUBCUTANEOUS CYST UPPER BACK;  Surgeon: Berna Bue, MD;  Location: [facility];  Service: General;  Laterality: N/A;   PILONIDAL CYST EXCISION N/A 07/18/2017   Procedure: CYST EXCISION PILONIDAL;  Surgeon: Berna Bue, MD;  Location: [facility];  Service: General;  Laterality: N/A;   UPPER GASTROINTESTINAL ENDOSCOPY     WISDOM TOOTH EXTRACTION  2015    Family History  Problem Relation Age of Onset   Asthma Mother    Irritable bowel syndrome Mother    Endometriosis  Mother    Colon cancer Mother    COPD Mother    Hypertension Father    Colon polyps Father    Endometriosis Sister    Irritable bowel syndrome Sister    Diverticulitis Sister    Heart attack Maternal Grandmother    Breast cancer Paternal Grandfather    Colon cancer Maternal Uncle    Liver cancer Neg Hx    Rectal cancer Neg Hx    Pancreatic cancer Neg Hx    Stomach cancer Neg Hx    Esophageal cancer Neg Hx     Social History   Tobacco Use   Smoking status: Former    Current packs/day: 0.00    Average packs/day: 0.5 packs/day for 5.0 years (2.5 ttl pk-yrs)    Types: Cigarettes, E-cigarettes    Start date: 05/15/2012    Quit date: 05/15/2017    Years since quitting: 5.6   Smokeless tobacco: Never   Tobacco comments:    07-15-2017  per pt quit smoking cig. 05-15-2017 but occasionally vapes  Vaping Use   Vaping status: Some Days  Substance Use Topics   Alcohol use: Not Currently    Comment: rarely   Drug use: No    Prior to Admission medications   Medication Sig Start Date End Date Taking? Authorizing Provider  albuterol (PROVENTIL) (2.5 MG/3ML) 0.083% nebulizer solution Take 2.5 mg by nebulization every 6 (six) hours as needed for wheezing or shortness of breath.    [provider]  albuterol (VENTOLIN HFA) 108 (90 Base) MCG/ACT inhaler Inhale 1-2 puffs into the lungs every 6 (six) hours as needed for wheezing or shortness of breath. 03/15/20   [provider]  Azelastine-Fluticasone 137-50 MCG/ACT SUSP Place 1 spray into the nose daily. 12/25/22   Charlott Holler, MD  cephALEXin (KEFLEX) 500 MG capsule Take 1 capsule (500 mg total) by mouth 3 (three) times daily. 09/09/22   Charlynne Pander, MD  cetirizine (ZYRTEC) 10 MG tablet Take 10 mg by mouth daily. 11/28/20   [provider]  clindamycin (CLEOCIN T) 1 % SWAB Apply 1 Application topically 2 (two) times daily. 08/14/21   [provider]  clobetasol cream (TEMOVATE) 0.05 % Apply 1  Application topically 2 (two) times daily as needed (irritation). 08/13/21   [provider]  diclofenac Sodium (VOLTAREN) 1 % GEL Apply 2-3 g topically 4 (four) times daily as needed (pain). 12/20/20   [provider]  dicyclomine (BENTYL) 10 MG capsule TAKE 1 CAPSULE 2-3 TIMES A DAY AS NEEDED FOR ABDOMINAL CRAMPS 01/21/22   Sherrilyn Rist, MD  fluticasone-salmeterol (ADVAIR DISKUS) 250-50 MCG/ACT AEPB Inhale 1 puff into the lungs in the morning and at bedtime. 01/01/23   Charlott Holler, MD  hydrocortisone cream 1 % Apply to affected area 2 times daily Patient taking differently: Apply 1 Application topically 2 (two) times daily as needed for itching. 03/24/21   Achille Rich, PA-C  hydrOXYzine (ATARAX) 25 MG tablet Take 25 mg by mouth daily as needed for anxiety (panic attack). 02/19/22   [provider]  linaclotide Karlene Einstein) 72 MCG capsule Take 1 capsule (72 mcg total) by mouth daily before breakfast. 05/15/21   Danis, Andreas Blower, MD  lubiprostone (AMITIZA) 8 MCG capsule TAKE 1 CAPSULE (8 MCG TOTAL) BY MOUTH 2 (TWO) TIMES DAILY WITH A MEAL. 01/08/22   Sherrilyn Rist, MD  montelukast (SINGULAIR) 10 MG tablet Take 1 tablet (10 mg total) by mouth at bedtime. 01/01/23   Charlott Holler, MD  mupirocin ointment (BACTROBAN) 2 % Apply 1 Application topically 2 (two) times daily. 08/13/21   [provider]  NON FORMULARY Pt uses a cpap nightly    [provider]  omeprazole (PRILOSEC) 40 MG capsule Take 1 capsule (40 mg total) by mouth daily. 11/11/22   [name], MD  ondansetron (ZOFRAN) 4 MG tablet TAKE 1 TABLET BY MOUTH EVERY 8 HOURS AS NEEDED FOR NAUSEA AND VOMITING 02/25/22   Sherrilyn Rist, MD  oxyCODONE-acetaminophen (PERCOCET) 5-325 MG tablet Take 1-2 tablets by mouth every 6 (six) hours as needed for severe pain (pain score 7-10). 12/24/22   Sherrilyn Rist, MD  predniSONE (DELTASONE) 10 MG tablet Take 4 tablets (40 mg total) by mouth daily  with breakfast for 7 days, THEN 3 tablets (30 mg total) daily with breakfast for 7 days, THEN 2 tablets (20 mg total) daily with breakfast for 7 days, THEN 1 tablet (10 mg total) daily with breakfast for 7 days. 12/27/22 01/24/23  Sherrilyn Rist, MD  Risankizumab-rzaa [facility]) 180 MG/1.2ML SOCT Inject (1.2 ml) under the skin every 8 weeks 03/28/22   Sherrilyn Rist, MD  sertraline (ZOLOFT) 50 MG tablet Take 50 mg by mouth daily. 03/01/20   [provider]  traZODone (DESYREL) 50 MG tablet TAKE 1 TABLET BY MOUTH EVERYDAY AT BEDTIME 01/07/23   Sherrilyn Rist, MD    No current facility-administered medications for this encounter.   Current Outpatient Medications  Medication Sig Dispense Refill   albuterol (PROVENTIL) (2.5 MG/3ML) 0.083% nebulizer solution Take 2.5 mg by nebulization every 6 (six) hours as needed for wheezing or shortness of breath.     albuterol (VENTOLIN HFA) 108 (90 Base) MCG/ACT inhaler Inhale 1-2 puffs into the lungs every 6 (six) hours as needed for wheezing or shortness of breath.     Azelastine-Fluticasone 137-50 MCG/ACT SUSP Place 1 spray into the nose daily. 23 g 11   cephALEXin (KEFLEX) 500 MG capsule Take 1 capsule (500 mg total) by mouth 3 (three) times daily. 21 capsule 0   cetirizine (ZYRTEC) 10 MG tablet Take 10 mg by mouth daily.     clindamycin (CLEOCIN T) 1 % SWAB Apply 1 Application topically 2 (two) times daily.     clobetasol cream (TEMOVATE) 0.05 % Apply 1 Application topically 2 (two) times daily as needed (irritation).     diclofenac Sodium (VOLTAREN) 1 % GEL Apply 2-3 g topically 4 (four) times daily as needed (pain).     dicyclomine (BENTYL) 10 MG capsule TAKE 1 CAPSULE 2-3 TIMES A DAY AS NEEDED FOR ABDOMINAL CRAMPS 60 capsule 1   fluticasone-salmeterol (ADVAIR DISKUS) 250-50 MCG/ACT AEPB Inhale 1 puff into the lungs in the morning and at bedtime. 180 each 1   hydrocortisone cream 1 % Apply to affected area 2 times daily (Patient taking  differently: Apply 1 Application topically  2 (two) times daily as needed for itching.) 15 g 0   hydrOXYzine (ATARAX) 25 MG tablet Take 25 mg by mouth daily as needed for anxiety (panic attack).     linaclotide (LINZESS) 72 MCG capsule Take 1 capsule (72 mcg total) by mouth daily before breakfast. 90 capsule 1   lubiprostone (AMITIZA) 8 MCG capsule TAKE 1 CAPSULE (8 MCG TOTAL) BY MOUTH 2 (TWO) TIMES DAILY WITH A MEAL. 180 capsule 2   montelukast (SINGULAIR) 10 MG tablet Take 1 tablet (10 mg total) by mouth at bedtime. 30 tablet 3   mupirocin ointment (BACTROBAN) 2 % Apply 1 Application topically 2 (two) times daily.     NON FORMULARY Pt uses a cpap nightly     omeprazole (PRILOSEC) 40 MG capsule Take 1 capsule (40 mg total) by mouth daily. 90 capsule 2   ondansetron (ZOFRAN) 4 MG tablet TAKE 1 TABLET BY MOUTH EVERY 8 HOURS AS NEEDED FOR NAUSEA AND VOMITING 60 tablet 1   oxyCODONE-acetaminophen (PERCOCET) 5-325 MG tablet Take 1-2 tablets by mouth every 6 (six) hours as needed for severe pain (pain score 7-10). 20 tablet 0   predniSONE (DELTASONE) 10 MG tablet Take 4 tablets (40 mg total) by mouth daily with breakfast for 7 days, THEN 3 tablets (30 mg total) daily with breakfast for 7 days, THEN 2 tablets (20 mg total) daily with breakfast for 7 days, THEN 1 tablet (10 mg total) daily with breakfast for 7 days. 70 tablet 0   Risankizumab-rzaa (SKYRIZI) 180 MG/1.2ML SOCT Inject (1.2 ml) under the skin every 8 weeks 1.2 mL 6   sertraline (ZOLOFT) 50 MG tablet Take 50 mg by mouth daily.     traZODone (DESYREL) 50 MG tablet TAKE 1 TABLET BY MOUTH EVERYDAY AT BEDTIME 90 tablet 3    Allergies as of 01/14/2023 - Review Complete 01/14/2023  Allergen Reaction Noted   Bactrim [sulfamethoxazole-trimethoprim] Nausea And Vomiting 01/15/2017   Bismuth subsalicylate Nausea And Vomiting 08/21/2022   Bismuth-containing compounds Nausea And Vomiting 06/15/2019   Cefpodoxime Nausea And Vomiting and Other (See  Comments) 01/15/2017   Doxycycline Nausea And Vomiting 01/15/2017   Silicone Other (See Comments) 09/18/2022   Vancomycin Itching and Other (See Comments) 07/18/2017     Review of Systems:    Constitutional: See HPI Skin: No rash Cardiovascular: No chest pain Respiratory: No SOB  Gastrointestinal: See HPI and otherwise negative Genitourinary: No dysuria Neurological: No headache, dizziness or syncope Musculoskeletal: No new muscle or joint pain Hematologic: No bruising Psychiatric: No history of depression or anxiety    Physical Exam:  Vital signs in last 24 hours: Temp:  [98.3 F (36.8 C)] 98.3 F (36.8 C) (11/26 1316) Pulse Rate:  [76-109] 109 (11/26 1316) Resp:  [19] 19 (11/26 1316) BP: (130-151)/(80-84) 151/84 (11/26 1316) SpO2:  [100 %] 100 % (11/26 1316) Weight:  [71.2 kg] 71.2 kg (11/26 1034)   See Physical exam from previous note today.  LAB RESULTS: Recent Labs    01/14/23 1346  WBC 13.0*  HGB 12.1  HCT 38.7  PLT 470*   BMET Recent Labs    01/14/23 1346  NA 135  K 3.6  CL 104  CO2 21*  GLUCOSE 116*  BUN 10  CREATININE 0.60  CALCIUM 8.9   LFT Recent Labs    01/14/23 1346  PROT 7.3  ALBUMIN 4.0  AST 14*  ALT 11  ALKPHOS 68  BILITOT 0.6     Impression / Plan:   Impression: 1.  Crohn's disease with complication: Severe abdominal pain, altered bowel habits and weight loss despite Prednisone taper which started at 40 mg daily, currently on Prednisone 20 mg daily, previous colonoscopy with transmural inflammation of the rectosigmoid junction and terminal ileum, concern for possible escalation of disease extent or development of complications such as fistula or abscess  Plan: 1.  CBC, CMP, IV fluids and a CTAP with oral and IV contrast 2.  Repeat C. difficile testing with PCR, GDH antigen and toxin 3.  Patient will likely need to be started on IV prednisone to better manage symptoms 4.  Will consider repeat colonoscopy pending course 5.   Would recommend a liquid diet as tolerated today.  Thank you for your kind consultation, we will continue to follow.  Violet Baldy Odella Appelhans  01/14/2023, 4:06 PM

## 2023-01-14 NOTE — Progress Notes (Signed)
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  Started on [place] and had complete symptomatic and mucosal remission on last colonoscopy January 2024.  Was doing well at clinic follow-up April 2024. History of anal fissure and C. difficile infection May 2024 office visit for acute symptoms of probable gastrointestinal infection,, tested negative for C. difficile. 15 mm cecal SSP removed March 2021.  Diminutive transverse colon tubular adenoma September 2022.  No polyps on January 2024 diagnostic colonoscopy, recall for polyp surveillance at January 2027  Seen 12/24/2022 for an apparent flare of symptoms with lower abdominal/pelvic pain and bleeding reminiscent of prior flares.  Clinical details in office note of that day.  Diatherix C. difficile swab negative, and then prednisone taper started. Recurrent iron deficiency anemia believed due to her ongoing chronic disease related blood loss.  Previously intolerant of oral iron with abdominal pain and worsened chronic constipation.  IV iron ordered (which she had tolerated well in the past), though we were just notified by her that insurance declined it.  Clinical nursing will contact pharmacy staff to work on the prior authorization.   Discussed the use of AI scribe software for clinical note transcription with the patient, who gave verbal consent to proceed.  History of Present Illness   The patient, with a known diagnosis of Crohn's disease, presents with escalating lower abdominal pain, predominantly on the left side. They report a decrease in the previously noted blood in their stool. The pain is severe postprandially, often leading to an urge to  defecate, which is sometimes unproductive and at other times results in prolonged episodes of loose stools. The patient describes this as the worst they have felt during their disease course.  Despite a recent course of prednisone, initiated after a negative C. diff test, there was no noticeable improvement in their symptoms. The patient also completed a five-day course of Macrobid for a suspected UTI.  The patient's appetite has been significantly affected due to the pain, leading to a weight loss of two pounds over the past two weeks. Their diet is currently limited to oatmeal.  The patient's Crohn's disease, diagnosed after the involvement of the terminal ileum was identified, had previously been localized to the rectosigmoid junction. The disease had shown considerable improvement on Skyrizi. However, the current escalation of pain, lack of response to prednisone, and decreased oral intake are concerning. The patient's past scans have shown transmural inflammation, raising concerns about possible disease progression or complications such as fistula formation or abscess.        ROS: Cardiovascular:  no chest pain Respiratory: no dyspnea Denies fever chills or night sweats Weight loss as noted above Anxiety, worsened lately over the prospect of needing to change Crohn's therapies send possible need for surgery Remainder systems negative except as above The patient's Past Medical, Family and Social History were reviewed and are on file in the EMR. Past Medical History:  Diagnosis Date   Allergy    cats   Anemia    Anxiety    panic attacks   Asthma    as a child - dirty cats will cause an asthma attack   Crohn's disease (HCC)    Depression    GERD (gastroesophageal reflux disease)  Hemorrhoids    History of asthma    childhood   History of kidney stones    IBS (irritable bowel syndrome)    Pilonidal cyst    Sleep apnea    Wears glasses     Past Surgical History:  Procedure  Laterality Date   ANAL RECTAL MANOMETRY N/A 07/24/2020   Procedure: ANO RECTAL MANOMETRY;  Surgeon: Sherrilyn Rist, MD;  Location: [facility];  Service: Gastroenterology;  Laterality: N/A;   COLONOSCOPY     COLONOSCOPY WITH ESOPHAGOGASTRODUODENOSCOPY (EGD)  x2  last one 2015   KNEE ARTHROSCOPY Left 03/30/2020   LIPOMA EXCISION N/A 08/28/2022   Procedure: EXCISION OF SUBCUTANEOUS CYST UPPER BACK;  Surgeon: Berna Bue, MD;  Location: [facility];  Service: General;  Laterality: N/A;   PILONIDAL CYST EXCISION N/A 07/18/2017   Procedure: CYST EXCISION PILONIDAL;  Surgeon: Berna Bue, MD;  Location: [facility];  Service: General;  Laterality: N/A;   UPPER GASTROINTESTINAL ENDOSCOPY     WISDOM TOOTH EXTRACTION  2015     Objective:  Med list reviewed  Current Outpatient Medications:    albuterol (PROVENTIL) (2.5 MG/3ML) 0.083% nebulizer solution, Take 2.5 mg by nebulization every 6 (six) hours as needed for wheezing or shortness of breath., Disp: , Rfl:    albuterol (VENTOLIN HFA) 108 (90 Base) MCG/ACT inhaler, Inhale 1-2 puffs into the lungs every 6 (six) hours as needed for wheezing or shortness of breath., Disp: , Rfl:    Azelastine-Fluticasone 137-50 MCG/ACT SUSP, Place 1 spray into the nose daily., Disp: 23 g, Rfl: 11   cephALEXin (KEFLEX) 500 MG capsule, Take 1 capsule (500 mg total) by mouth 3 (three) times daily., Disp: 21 capsule, Rfl: 0   cetirizine (ZYRTEC) 10 MG tablet, Take 10 mg by mouth daily., Disp: , Rfl:    clindamycin (CLEOCIN T) 1 % SWAB, Apply 1 Application topically 2 (two) times daily., Disp: , Rfl:    clobetasol cream (TEMOVATE) 0.05 %, Apply 1 Application topically 2 (two) times daily as needed (irritation)., Disp: , Rfl:    diclofenac Sodium (VOLTAREN) 1 % GEL, Apply 2-3 g topically 4 (four) times daily as needed (pain)., Disp: , Rfl:    dicyclomine (BENTYL) 10 MG capsule, TAKE 1 CAPSULE 2-3 TIMES A DAY AS NEEDED FOR ABDOMINAL CRAMPS, Disp:  60 capsule, Rfl: 1   fluticasone-salmeterol (ADVAIR DISKUS) 250-50 MCG/ACT AEPB, Inhale 1 puff into the lungs in the morning and at bedtime., Disp: 180 each, Rfl: 1   hydrocortisone cream 1 %, Apply to affected area 2 times daily (Patient taking differently: Apply 1 Application topically 2 (two) times daily as needed for itching.), Disp: 15 g, Rfl: 0   hydrOXYzine (ATARAX) 25 MG tablet, Take 25 mg by mouth daily as needed for anxiety (panic attack)., Disp: , Rfl:    linaclotide (LINZESS) 72 MCG capsule, Take 1 capsule (72 mcg total) by mouth daily before breakfast., Disp: 90 capsule, Rfl: 1   lubiprostone (AMITIZA) 8 MCG capsule, TAKE 1 CAPSULE (8 MCG TOTAL) BY MOUTH 2 (TWO) TIMES DAILY WITH A MEAL., Disp: 180 capsule, Rfl: 2   montelukast (SINGULAIR) 10 MG tablet, Take 1 tablet (10 mg total) by mouth at bedtime., Disp: 30 tablet, Rfl: 3   mupirocin ointment (BACTROBAN) 2 %, Apply 1 Application topically 2 (two) times daily., Disp: , Rfl:    NON FORMULARY, Pt uses a cpap nightly, Disp: , Rfl:    omeprazole (PRILOSEC) 40 MG capsule, Take 1 capsule (  40 mg total) by mouth daily., Disp: 90 capsule, Rfl: 2   ondansetron (ZOFRAN) 4 MG tablet, TAKE 1 TABLET BY MOUTH EVERY 8 HOURS AS NEEDED FOR NAUSEA AND VOMITING, Disp: 60 tablet, Rfl: 1   oxyCODONE-acetaminophen (PERCOCET) 5-325 MG tablet, Take 1-2 tablets by mouth every 6 (six) hours as needed for severe pain (pain score 7-10)., Disp: 20 tablet, Rfl: 0   predniSONE (DELTASONE) 10 MG tablet, Take 4 tablets (40 mg total) by mouth daily with breakfast for 7 days, THEN 3 tablets (30 mg total) daily with breakfast for 7 days, THEN 2 tablets (20 mg total) daily with breakfast for 7 days, THEN 1 tablet (10 mg total) daily with breakfast for 7 days., Disp: 70 tablet, Rfl: 0   Risankizumab-rzaa (SKYRIZI) 180 MG/1.2ML SOCT, Inject (1.2 ml) under the skin every 8 weeks, Disp: 1.2 mL, Rfl: 6   sertraline (ZOLOFT) 50 MG tablet, Take 50 mg by mouth daily., Disp: , Rfl:     traZODone (DESYREL) 50 MG tablet, TAKE 1 TABLET BY MOUTH EVERYDAY AT BEDTIME, Disp: 90 tablet, Rfl: 3   Vital signs in last 24 hrs: Vitals:   01/14/23 1034  BP: 130/80  Pulse: 76   Wt Readings from Last 3 Encounters:  01/14/23 157 lb (71.2 kg)  12/25/22 160 lb 9.6 oz (72.8 kg)  12/24/22 160 lb 6.4 oz (72.8 kg)    Physical Exam  She is down a few pounds as noted above Nontoxic-appearing, does not appear volume depleted, but she is visibly in pain and upset, looks like she has not slept well  HEENT: sclera anicteric, oral mucosa moist without lesions Neck: supple, no thyromegaly, JVD or lymphadenopathy Cardiac: Regular without appreciable murmur,  no peripheral edema Pulm: clear to auscultation bilaterally, normal RR and effort noted Abdomen: soft, nondistended, LLQ and RLQ tenderness, with active bowel sounds of normal character. No guarding or palpable hepatosplenomegaly. Skin; warm and dry, no jaundice or rash   Labs:     Latest Ref Rng & Units 12/24/2022    4:58 PM 09/08/2022    9:26 PM 08/27/2022    7:44 AM  CBC  WBC 4.0 - 10.5 K/uL 11.9  10.7  6.7   Hemoglobin 12.0 - 15.0 g/dL 82.9  56.2  13.0   Hematocrit 36.0 - 46.0 % 36.1  35.2  38.5   Platelets 150.0 - 400.0 K/uL 455.0  451  459    Iron/TIBC/Ferritin/ %Sat    Component Value Date/Time   IRON 25 (L) 12/24/2022 1658   TIBC 459.2 (H) 12/24/2022 1658   FERRITIN 6.9 (L) 12/24/2022 1658   FERRITIN 6.9 (L) 12/24/2022 1658   IRONPCTSAT 5.4 (L) 12/24/2022 1658   Sed rate was 32 on 12/24/2022  ___________________________________________ Radiologic studies:   ____________________________________________ Other:   _____________________________________________   Encounter Diagnoses  Name Primary?   Crohn's disease of both small and large intestine with rectal bleeding (HCC) Yes   LLQ abdominal pain    Rectal bleeding    Long-term use of immunosuppressant medication    Nausea in adult    Her escalating  symptoms that now have led to reduction oral intake with weight loss leading to risk of volume depletion as well as her lack of improvement on 40 mg of prednisone (now down to 20 mg daily) is very concerning.  She previously has not tolerated higher doses of prednisone well due to side effects.  She is also been on pain medicine with limited improvement.  All of this is  worrisome for escalating severity and/or extent of the Crohn's as well as possible complications such as fistula and abscess.  She does not clinically appear to have a bowel obstruction on exam. Assessment and Plan    Crohn's Disease Severe abdominal pain, altered bowel habits, and weight loss despite Prednisone taper. No improvement noted with Prednisone 40mg . Currently on Prednisone 20mg . Previous colonoscopy showed transmural inflammation at the rectosigmoid junction and terminal ileum. Concern for possible escalation of disease extent or development of complications such as fistula or abscess.  I have recommended Keyundra go to the emergency department Marissa Morales today.  She needs a CBC, CMP, IV fluids, CT abdomen and pelvis with oral and IV contrast as well as repeat C. difficile testing with PCR, GDH antigen and toxin.  Although she had a negative Diatherix C. difficile test in the office, we need to be absolutely certain this is not C. difficile given her previous history and potentially overlapping symptoms with her IBD.  She may require hospital admission based on those results, especially since I believe we have maximized our outpatient management.  Colonoscopy may be warranted as well.  She will head to the emergency department shortly and have her fianc meet her there later.  I will speak with our inpatient consult team so she can be seen later today.    [name]

## 2023-01-14 NOTE — Progress Notes (Signed)
Patient arrived to the floor, room 1314 via wheelchair. Ambulatory from the chair to the bed.  Vitals within normal limits. Oriented to the room. Call bell within reach. Plan of care ongoing.

## 2023-01-14 NOTE — ED Notes (Signed)
ED TO INPATIENT HANDOFF REPORT  ED Nurse Name and Phone #:  Leatrice Jewels [phone]  S Name/Age/Gender Marissa Morales 27 y.o. female Room/Bed: WA07/WA07  Code Status   Code Status: Full Code  Home/SNF/Other Home Patient oriented to: self, place, time, and situation Is this baseline? Yes   Triage Complete: Triage complete  Chief Complaint Crohn's disease [facility]) [K50.90]  Triage Note Left sided abd pain that is worse with eating and n/v/d x2 weeks.  Hx chron's.  Pt reports was having rectal bleeding but Started prednisone taper on 11/5 that she currently still is on.  prednisone improved rectal bleeding per patient.    Allergies Allergies  Allergen Reactions   Bactrim [Sulfamethoxazole-Trimethoprim] Nausea And Vomiting   Bismuth Subsalicylate Nausea And Vomiting   Bismuth-Containing Compounds Nausea And Vomiting   Cefpodoxime Nausea And Vomiting and Other (See Comments)    Childhood rx - GI upset  Childhood   Doxycycline Nausea And Vomiting   Silicone Other (See Comments)    BURNS AND PULLS SKIN OFF   Vancomycin Itching and Other (See Comments)    Severe pruritus of scalp, flushing  Red Man Syndrome    Level of Care/Admitting Diagnosis ED Disposition     ED Disposition  Admit   Condition  --   Comment  Hospital Area: [facility] [[id number]]  Level of Care: Med-Surg [16]  May place patient in observation at [facility] or [facility] if equivalent level of care is available:: No  Covid Evaluation: Asymptomatic - no recent exposure (last 10 days) testing not required  Diagnosis: Crohn's disease [facility]) [[id number]]  Admitting Physician: Almon Hercules [[id number]]  Attending Physician: Almon Hercules [[id number]]          B Medical/Surgery History Past Medical History:  Diagnosis Date   Allergy    cats   Anemia    Anxiety    panic attacks   Asthma    as a child - dirty cats will cause an asthma attack   Crohn's disease (HCC)    Depression     GERD (gastroesophageal reflux disease)    Hemorrhoids    History of asthma    childhood   History of kidney stones    IBS (irritable bowel syndrome)    Pilonidal cyst    Sleep apnea    Wears glasses    Past Surgical History:  Procedure Laterality Date   ANAL RECTAL MANOMETRY N/A 07/24/2020   Procedure: ANO RECTAL MANOMETRY;  Surgeon: Sherrilyn Rist, MD;  Location: [facility];  Service: Gastroenterology;  Laterality: N/A;   COLONOSCOPY     COLONOSCOPY WITH ESOPHAGOGASTRODUODENOSCOPY (EGD)  x2  last one 2015   KNEE ARTHROSCOPY Left 03/30/2020   LIPOMA EXCISION N/A 08/28/2022   Procedure: EXCISION OF SUBCUTANEOUS CYST UPPER BACK;  Surgeon: Berna Bue, MD;  Location: [facility];  Service: General;  Laterality: N/A;   PILONIDAL CYST EXCISION N/A 07/18/2017   Procedure: CYST EXCISION PILONIDAL;  Surgeon: Berna Bue, MD;  Location: [facility];  Service: General;  Laterality: N/A;   UPPER GASTROINTESTINAL ENDOSCOPY     WISDOM TOOTH EXTRACTION  2015     A IV Location/Drains/Wounds Patient Lines/Drains/Airways Status     Active Line/Drains/Airways     Name Placement date Placement time Site Days   Peripheral IV 01/14/23 20 G Right Antecubital 01/14/23  1640  Antecubital  less than 1            Intake/Output Last  24 hours No intake or output data in the 24 hours ending 01/14/23 2104  Labs/Imaging Results for orders placed or performed during the hospital encounter of 01/14/23 (from the past 48 hour(s))  Lipase, blood     Status: None   Collection Time: 01/14/23  1:46 PM  Result Value Ref Range   Lipase 40 11 - 51 U/L    Comment: Performed at [facility], [address] [place], [place] [address]  Comprehensive metabolic panel     Status: Abnormal   Collection Time: 01/14/23  1:46 PM  Result Value Ref Range   Sodium 135 135 - 145 mmol/L   Potassium 3.6 3.5 - 5.1 mmol/L   Chloride 104 98 - 111 mmol/L   CO2 21 (L) 22 - 32  mmol/L   Glucose, Bld 116 (H) 70 - 99 mg/dL    Comment: Glucose reference range applies only to samples taken after fasting for at least 8 hours.   BUN 10 6 - 20 mg/dL   Creatinine, Ser 6.04 0.44 - 1.00 mg/dL   Calcium 8.9 8.9 - 54.0 mg/dL   Total Protein 7.3 6.5 - 8.1 g/dL   Albumin 4.0 3.5 - 5.0 g/dL   AST 14 (L) 15 - 41 U/L   ALT 11 0 - 44 U/L   Alkaline Phosphatase 68 38 - 126 U/L   Total Bilirubin 0.6 <1.2 mg/dL   GFR, Estimated >98 >11 mL/min    Comment: (NOTE) Calculated using the CKD-EPI Creatinine Equation (2021)    Anion gap 10 5 - 15    Comment: Performed at [facility], [address], [place], [place] [address]  CBC     Status: Abnormal   Collection Time: 01/14/23  1:46 PM  Result Value Ref Range   WBC 13.0 (H) 4.0 - 10.5 K/uL   RBC 5.05 3.87 - 5.11 MIL/uL   Hemoglobin 12.1 12.0 - 15.0 g/dL   HCT 29.5 62.1 - 30.8 %   MCV 76.6 (L) 80.0 - 100.0 fL   MCH 24.0 (L) 26.0 - 34.0 pg   MCHC 31.3 30.0 - 36.0 g/dL   RDW 65.7 (H) 84.6 - 96.2 %   Platelets 470 (H) 150 - 400 K/uL   nRBC 0.0 0.0 - 0.2 %    Comment: Performed at [facility], [address] [place], [place] [address]  hCG, serum, qualitative     Status: None   Collection Time: 01/14/23  1:46 PM  Result Value Ref Range   Preg, Serum NEGATIVE NEGATIVE    Comment:        THE SENSITIVITY OF THIS METHODOLOGY IS >10 mIU/mL. Performed at [facility], [address] [place], [place] [address]   Urinalysis, Routine w reflex microscopic -Urine, Clean Catch     Status: Abnormal   Collection Time: 01/14/23  5:17 PM  Result Value Ref Range   Color, Urine STRAW (A) YELLOW   APPearance CLEAR CLEAR   Specific Gravity, Urine 1.005 1.005 - 1.030   pH 8.0 5.0 - 8.0   Glucose, UA NEGATIVE NEGATIVE mg/dL   Hgb urine dipstick MODERATE (A) NEGATIVE   Bilirubin Urine NEGATIVE NEGATIVE   Ketones, ur NEGATIVE NEGATIVE mg/dL   Protein, ur NEGATIVE NEGATIVE mg/dL    Nitrite NEGATIVE NEGATIVE   Leukocytes,Ua NEGATIVE NEGATIVE   RBC / HPF 0-5 0 - 5 RBC/hpf   WBC, UA 0-5 0 - 5 WBC/hpf   Bacteria, UA RARE (A) NONE SEEN  Squamous Epithelial / HPF 0-5 0 - 5 /HPF    Comment: Performed at [facility], [address] [place], [place] [address]   CT ABDOMEN PELVIS W CONTRAST  Result Date: 01/14/2023 CLINICAL DATA:  Crohn's disease with acute exacerbation. Left abdominal pain, nausea/vomiting/diarrhea. EXAM: CT ABDOMEN AND PELVIS WITH CONTRAST TECHNIQUE: Multidetector CT imaging of the abdomen and pelvis was performed using the standard protocol following bolus administration of intravenous contrast. RADIATION DOSE REDUCTION: This exam was performed according to the departmental dose-optimization program which includes automated exposure control, adjustment of the mA and/or kV according to patient size and/or use of iterative reconstruction technique. CONTRAST:  OMNIPAQUE IOHEXOL 300 MG/ML  SOLN COMPARISON:  09/08/2022 FINDINGS: Lower chest: No acute abnormality. Hepatobiliary: Mild focal fatty hepatic infiltration adjacent the falciform ligament. Mild background diffuse hepatic steatosis. No focal enhancing intrahepatic mass. No intra or extrahepatic biliary ductal dilation. Gallbladder unremarkable. Pancreas: Unremarkable Spleen: Stable borderline splenomegaly with the spleen measuring 13.7 cm in greatest dimension. No intrasplenic lesion identified. Splenic vein is patent. Adrenals/Urinary Tract: Adrenal glands are unremarkable. Kidneys are normal, without renal calculi, focal lesion, or hydronephrosis. Bladder is unremarkable. Stomach/Bowel: The stomach, small bowel, and large bowel are unremarkable. No evidence of obstruction or focal inflammation. The appendix is normal. No free intraperitoneal gas or fluid. Vascular/Lymphatic: No significant vascular findings are present. No enlarged abdominal or pelvic lymph nodes. Reproductive: Uterus and  bilateral adnexa are unremarkable. Other: No abdominal wall hernia or abnormality. No abdominopelvic ascites. Musculoskeletal: No acute or significant osseous findings. IMPRESSION: 1. No acute intra-abdominal pathology identified. No definite radiographic explanation for the patient's reported symptoms. 2. Mild hepatic steatosis. 3. Stable borderline splenomegaly. Electronically Signed   By: Helyn Numbers M.D.   On: 01/14/2023 19:25    Pending Labs Unresulted Labs (From admission, onward)     Start     Ordered   01/15/23 0500  Basic metabolic panel  Tomorrow morning,   R        01/14/23 2029   01/15/23 0500  CBC with Differential/Platelet  Tomorrow morning,   R        01/14/23 2045   01/14/23 2029  HIV Antibody (routine testing w rflx)  (HIV Antibody (Routine testing w reflex) panel)  Once,   R        01/14/23 2029   01/14/23 2004  C-reactive protein  Once,   R        01/14/23 2003   01/14/23 2004  Sedimentation rate  Once,   R        01/14/23 2003   01/14/23 1615  C Difficile Quick Screen w PCR reflex  (C Difficile quick screen w PCR reflex panel )  Once, for 24 hours,   URGENT       References:    CDiff Information Tool   01/14/23 1615            Vitals/Pain Today's Vitals   01/14/23 1927 01/14/23 1930 01/14/23 2015 01/14/23 2055  BP: 125/87 124/83 (!) 139/95   Pulse: (!) 59 61 65   Resp: 18     Temp: 98.4 F (36.9 C)     TempSrc: Oral     SpO2: 96% 97% 99%   PainSc:    7     Isolation Precautions Enteric precautions (UV disinfection)  Medications Medications  oxyCODONE-acetaminophen (PERCOCET/ROXICET) 5-325 MG per tablet 1-2 tablet (2 tablets Oral Given 01/14/23 2057)  mometasone-formoterol (DULERA) 200-5 MCG/ACT inhaler 2 puff (has  no administration in time range)  montelukast (SINGULAIR) tablet 10 mg (has no administration in time range)  loratadine (CLARITIN) tablet 10 mg (10 mg Oral Given 01/14/23 2057)  ondansetron (ZOFRAN) injection 4 mg (has no  administration in time range)  ondansetron (ZOFRAN-ODT) disintegrating tablet 4 mg (4 mg Oral Given 01/14/23 1434)  sodium chloride 0.9 % bolus 1,000 mL (0 mLs Intravenous Stopped 01/14/23 1840)  HYDROmorphone (DILAUDID) injection 0.5 mg (0.5 mg Intravenous Given 01/14/23 1646)  iohexol (OMNIPAQUE) 300 MG/ML solution 100 mL (100 mLs Intravenous Contrast Given 01/14/23 1741)    Mobility walks     Focused Assessments     R Recommendations: See Admitting Provider Note  Report given to:   Additional Notes:

## 2023-01-15 ENCOUNTER — Other Ambulatory Visit: Payer: Self-pay | Admitting: Internal Medicine

## 2023-01-15 ENCOUNTER — Observation Stay (HOSPITAL_COMMUNITY): Payer: 59 | Admitting: Anesthesiology

## 2023-01-15 ENCOUNTER — Encounter (HOSPITAL_COMMUNITY): Payer: Self-pay | Admitting: Student

## 2023-01-15 ENCOUNTER — Encounter (HOSPITAL_COMMUNITY)
Admission: EM | Disposition: A | Discharge status: Home / Self Care | Payer: Self-pay | Source: Home / Self Care | Attending: Emergency Medicine

## 2023-01-15 DIAGNOSIS — K5090 Crohn's disease, unspecified, without complications: Secondary | ICD-10-CM | POA: Diagnosis not present

## 2023-01-15 DIAGNOSIS — K5080 Crohn's disease of both small and large intestine without complications: Secondary | ICD-10-CM | POA: Diagnosis not present

## 2023-01-15 DIAGNOSIS — K648 Other hemorrhoids: Secondary | ICD-10-CM | POA: Diagnosis not present

## 2023-01-15 DIAGNOSIS — R1032 Left lower quadrant pain: Secondary | ICD-10-CM | POA: Diagnosis not present

## 2023-01-15 DIAGNOSIS — F418 Other specified anxiety disorders: Secondary | ICD-10-CM | POA: Diagnosis not present

## 2023-01-15 DIAGNOSIS — G4730 Sleep apnea, unspecified: Secondary | ICD-10-CM | POA: Diagnosis not present

## 2023-01-15 DIAGNOSIS — J45909 Unspecified asthma, uncomplicated: Secondary | ICD-10-CM | POA: Diagnosis not present

## 2023-01-15 HISTORY — PX: FLEXIBLE SIGMOIDOSCOPY: SHX5431

## 2023-01-15 HISTORY — PX: BIOPSY: SHX5522

## 2023-01-15 LAB — CBC WITH DIFFERENTIAL/PLATELET
Abs Immature Granulocytes: 0.04 10*3/uL (ref 0.00–0.07)
Basophils Absolute: 0.1 10*3/uL (ref 0.0–0.1)
Basophils Relative: 1 %
Eosinophils Absolute: 0.1 10*3/uL (ref 0.0–0.5)
Eosinophils Relative: 1 %
HCT: 36.2 % (ref 36.0–46.0)
Hemoglobin: 10.7 g/dL — ABNORMAL LOW (ref 12.0–15.0)
Immature Granulocytes: 0 %
Lymphocytes Relative: 46 %
Lymphs Abs: 5 10*3/uL — ABNORMAL HIGH (ref 0.7–4.0)
MCH: 23.4 pg — ABNORMAL LOW (ref 26.0–34.0)
MCHC: 29.6 g/dL — ABNORMAL LOW (ref 30.0–36.0)
MCV: 79 fL — ABNORMAL LOW (ref 80.0–100.0)
Monocytes Absolute: 0.8 10*3/uL (ref 0.1–1.0)
Monocytes Relative: 7 %
Neutro Abs: 5 10*3/uL (ref 1.7–7.7)
Neutrophils Relative %: 45 %
Platelets: 423 10*3/uL — ABNORMAL HIGH (ref 150–400)
RBC: 4.58 MIL/uL (ref 3.87–5.11)
RDW: 18.2 % — ABNORMAL HIGH (ref 11.5–15.5)
WBC: 11 10*3/uL — ABNORMAL HIGH (ref 4.0–10.5)
nRBC: 0 % (ref 0.0–0.2)

## 2023-01-15 LAB — BASIC METABOLIC PANEL
Anion gap: 10 (ref 5–15)
BUN: 7 mg/dL (ref 6–20)
CO2: 25 mmol/L (ref 22–32)
Calcium: 9 mg/dL (ref 8.9–10.3)
Chloride: 106 mmol/L (ref 98–111)
Creatinine, Ser: 0.62 mg/dL (ref 0.44–1.00)
GFR, Estimated: >60 mL/min (ref >60)
Glucose, Bld: 79 mg/dL (ref 70–99)
Potassium: 3.7 mmol/L (ref 3.5–5.1)
Sodium: 141 mmol/L (ref 135–145)

## 2023-01-15 LAB — C-REACTIVE PROTEIN: CRP: 0.5 mg/dL (ref <1.0)

## 2023-01-15 LAB — HIV ANTIBODY (ROUTINE TESTING W REFLEX): HIV Screen 4th Generation wRfx: NONREACTIVE

## 2023-01-15 SURGERY — SIGMOIDOSCOPY, FLEXIBLE
Anesthesia: Monitor Anesthesia Care

## 2023-01-15 MED ORDER — MIDAZOLAM HCL 2 MG/2ML IJ SOLN
INTRAMUSCULAR | Status: AC
Start: 2023-01-15 — End: ?
  Filled 2023-01-15: qty 2

## 2023-01-15 MED ORDER — MIDAZOLAM HCL 5 MG/5ML IJ SOLN
INTRAMUSCULAR | Status: DC | PRN
  Administered 2023-01-15: 2 mg via INTRAVENOUS

## 2023-01-15 MED ORDER — DICYCLOMINE HCL 10 MG PO CAPS
10.0000 mg | ORAL_CAPSULE | Freq: Three times a day (TID) | ORAL | Status: DC
  Administered 2023-01-15 (×2): 10 mg via ORAL
  Filled 2023-01-15 (×2): qty 1

## 2023-01-15 MED ORDER — FENTANYL CITRATE (PF) 100 MCG/2ML IJ SOLN
INTRAMUSCULAR | Status: DC | PRN
  Administered 2023-01-15 (×2): 25 ug via INTRAVENOUS

## 2023-01-15 MED ORDER — CARMEX CLASSIC LIP BALM EX OINT
TOPICAL_OINTMENT | CUTANEOUS | Status: DC | PRN
  Administered 2023-01-15: 1 via TOPICAL
  Filled 2023-01-15: qty 10

## 2023-01-15 MED ORDER — LINACLOTIDE 72 MCG PO CAPS: 72.0000 ug | ORAL_CAPSULE | Freq: Every day | ORAL | 0 refills | Status: DC

## 2023-01-15 MED ORDER — POLYETHYLENE GLYCOL 3350 17 G PO PACK: 17.0000 g | PACK | Freq: Every day | ORAL | 0 refills | Status: AC | PRN

## 2023-01-15 MED ORDER — PROPOFOL 500 MG/50ML IV EMUL
INTRAVENOUS | Status: DC | PRN
  Administered 2023-01-15: 155 ug/kg/min via INTRAVENOUS
  Administered 2023-01-15: 30 mg via INTRAVENOUS

## 2023-01-15 MED ORDER — LIDOCAINE HCL (CARDIAC) PF 100 MG/5ML IV SOSY
PREFILLED_SYRINGE | INTRAVENOUS | Status: DC | PRN
  Administered 2023-01-15: 70 mg via INTRAVENOUS

## 2023-01-15 MED ORDER — ONDANSETRON HCL 4 MG/2ML IJ SOLN
INTRAMUSCULAR | Status: DC | PRN
  Administered 2023-01-15: 4 mg via INTRAVENOUS

## 2023-01-15 MED ORDER — FENTANYL CITRATE (PF) 100 MCG/2ML IJ SOLN
INTRAMUSCULAR | Status: AC
  Filled 2023-01-15: qty 2

## 2023-01-15 MED ORDER — LACTATED RINGERS IV SOLN: INTRAVENOUS | Status: DC | PRN

## 2023-01-15 MED ORDER — GLYCOPYRROLATE 0.2 MG/ML IJ SOLN
INTRAMUSCULAR | Status: DC | PRN
  Administered 2023-01-15: .2 mg via INTRAVENOUS

## 2023-01-15 NOTE — Transfer of Care (Signed)
Immediate Anesthesia Transfer of Care Note  Patient: Marissa Morales  Procedure(s) Performed: FLEXIBLE SIGMOIDOSCOPY BIOPSY  Patient Location: PACU and Endoscopy Unit  Anesthesia Type:General  Level of Consciousness: awake, alert , and oriented  Airway & Oxygen Therapy: Patient Spontanous Breathing and Patient connected to face mask oxygen  Post-op Assessment: Report given to RN and Post -op Vital signs reviewed and stable  Post vital signs: Reviewed and stable  Last Vitals:  Vitals Value Taken Time  BP    Temp    Pulse 65 01/15/23 1557  Resp 14 01/15/23 1557  SpO2 98 % 01/15/23 1557  Vitals shown include unfiled device data.  Last Pain:  Vitals:   01/15/23 1400  TempSrc: Temporal  PainSc: 0-No pain         Complications: No notable events documented.

## 2023-01-15 NOTE — Progress Notes (Addendum)
Daily Progress Note  DOA: 01/14/2023 Hospital Day: 2  Chief Complaint:   ASSESSMENT    Brief Narrative:  Marissa Morales is a 27 y.o. 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 Currently maintained on Skyrizi with  clinical and endoscopic remission on last last colonoscopy Jan 2024. Had C-diff infection in May.  In Oct 2024 developed abdominal pain and blood in stools. Diatherix C-diff negative. Given tapering dose of steroids. Seen in office 11/26 with worsening symptoms on prednisone 20 mg daily. She was sent to ED.   Crohn's disease, maintained on Skyrizi. Admitted with LLQ pain, minor rectal bleeding, recent diarrhea ( Sunday and Monday), now resolved.  Source of pain unclear.  Surprisingly, CTAP without bowel inflammation. Normal CRP and ESR  Chronic constipation  Takes Amitiza 8 mcg BID and daily Miralax but still struggles with constipation. However, she admits to frequently forgetting to take Amitiza.  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   Hgb stable, considering that she has gotten IV fluids. Intolerant of PO iron. Our office arranging for IV Fe+  Principal Problem:   Crohn's disease (HCC)   PLAN   --We were thinking about flexible sigmoidoscopy later today vrs colonoscopy on Friday. Will discuss plan with Dr. Leone Payor in a while.  --Holding on IV steroids for now.  --If no BM by today then will give Linzess since Amitiza not on formulary --Will resume home Dicyclomine 10 gm TID  ADDENDUM:  Will proceed with sigmoidoscopy today. .The risks and benefits of flexible sigmoidoscopy with possible biopsies were discussed and the patient agrees to proceed.    Subjective   No significant abdominal pain right now. Mainly hurts in LLQ  after eating though did have some pain during the night. No BMs in 2 days.    Objective    Recent Labs    01/14/23 1346 01/15/23 0454  WBC 13.0* 11.0*  HGB 12.1 10.7*  HCT 38.7 36.2  PLT 470* 423*   BMET Recent Labs    01/14/23 1346 01/15/23 0454  NA 135 141  K 3.6 3.7  CL 104 106  CO2 21* 25  GLUCOSE 116* 79  BUN 10 7  CREATININE 0.60 0.62  CALCIUM 8.9 9.0   LFT Recent Labs    01/14/23 1346  PROT 7.3  ALBUMIN 4.0  AST 14*  ALT 11  ALKPHOS 68  BILITOT 0.6   PT/INR No results for input(s): "LABPROT", "INR" in the last 72 hours.   Imaging:  CT ABDOMEN PELVIS W CONTRAST CLINICAL DATA:  Crohn's disease with acute exacerbation. Left abdominal pain, nausea/vomiting/diarrhea.  EXAM: CT ABDOMEN AND PELVIS WITH CONTRAST  TECHNIQUE: Multidetector CT imaging of the abdomen and pelvis was performed using the standard protocol following bolus administration of intravenous contrast.  RADIATION DOSE REDUCTION: This exam was performed according to the departmental dose-optimization program which includes automated exposure control, adjustment of the mA and/or kV according to patient size and/or use of iterative reconstruction technique.  CONTRAST:  OMNIPAQUE IOHEXOL 300 MG/ML  SOLN  COMPARISON:  09/08/2022  FINDINGS: Lower chest: No acute abnormality.  Hepatobiliary: Mild focal fatty hepatic infiltration adjacent the falciform ligament. Mild background diffuse hepatic steatosis. No focal enhancing intrahepatic mass. No intra or extrahepatic biliary ductal dilation. Gallbladder unremarkable.  Pancreas: Unremarkable  Spleen: Stable borderline splenomegaly with the spleen measuring 13.7 cm in greatest dimension. No intrasplenic lesion identified. Splenic vein is patent.  Adrenals/Urinary Tract: Adrenal glands are unremarkable. Kidneys are normal, without renal calculi, focal lesion, or hydronephrosis. Bladder is unremarkable.  Stomach/Bowel: The  stomach, small bowel, and large bowel are unremarkable. No evidence of obstruction or focal inflammation. The appendix is normal. No free intraperitoneal gas or fluid.  Vascular/Lymphatic: No significant vascular findings are present. No enlarged abdominal or pelvic lymph nodes.  Reproductive: Uterus and bilateral adnexa are unremarkable.  Other: No abdominal wall hernia or abnormality. No abdominopelvic ascites.  Musculoskeletal: No acute or significant osseous findings.  IMPRESSION: 1. No acute intra-abdominal pathology identified. No definite radiographic explanation for the patient's reported symptoms. 2. Mild hepatic steatosis. 3. Stable borderline splenomegaly.  Electronically Signed   By: Helyn Numbers M.D.   On: 01/14/2023 19:25     Scheduled inpatient medications:   loratadine  10 mg Oral Daily   montelukast  10 mg Oral QHS   traZODone  50 mg Oral QHS   Continuous inpatient infusions:  PRN inpatient medications: ondansetron (ZOFRAN) IV, oxyCODONE-acetaminophen  Vital signs in last 24 hours: Temp:  [97.8 F (36.6 C)-98.4 F (36.9 C)] 98 F (36.7 C) (11/27 0620) Pulse Rate:  [56-109] 56 (11/27 0620) Resp:  [14-19] 16 (11/27 0620) BP: (105-152)/(68-95) 114/72 (11/27 0620) SpO2:  [94 %-100 %] 97 % (11/27 0620) Weight:  [71.2 kg] 71.2 kg (11/26 2238) Last BM Date : 01/15/23  Intake/Output Summary (Last 24 hours) at 01/15/2023 0857 Last data filed at 01/15/2023 0600 Gross per 24 hour  Intake 460 ml  Output 700 ml  Net -240 ml    Intake/Output from previous day: 11/26 0701 - 11/27 0700 In: 460 [P.O.:460] Out: 700 [Urine:700] Intake/Output this shift: No intake/output data recorded.   Physical Exam:  General: Alert female in NAD Heart:  Regular rate and rhythm.  Pulmonary: Normal respiratory effort Abdomen: Soft, nondistended, nontender. Normal bowel sounds. Extremities: No lower extremity edema  Neurologic: Alert and oriented Psych: Pleasant.  Cooperative. Insight appears normal.      LOS: 0 days   Willette Cluster ,NP 01/15/2023, 8:57 AM

## 2023-01-15 NOTE — Anesthesia Preprocedure Evaluation (Signed)
Anesthesia Evaluation  Patient identified by MRN, date of birth, ID band Patient awake    Reviewed: Allergy & Precautions, NPO status , Patient's Chart, lab work & pertinent test results  Airway Mallampati: II  TM Distance: <3 FB Neck ROM: Full   Comment: ANTERIOR  Dental no notable dental hx. (+) Teeth Intact, Dental Advisory Given   Pulmonary asthma , sleep apnea and Continuous Positive Airway Pressure Ventilation , former smoker   Pulmonary exam normal        Cardiovascular negative cardio ROS Normal cardiovascular exam     Neuro/Psych  PSYCHIATRIC DISORDERS Anxiety Depression    negative neurological ROS     GI/Hepatic Neg liver ROS, PUD,GERD  Medicated and Controlled,,  Endo/Other  negative endocrine ROS    Renal/GU negative Renal ROS     Musculoskeletal negative musculoskeletal ROS (+)    Abdominal   Peds  Hematology negative hematology ROS (+)   Anesthesia Other Findings   Reproductive/Obstetrics Hcg negative                              Anesthesia Physical Anesthesia Plan  ASA: 3  Anesthesia Plan: MAC   Post-op Pain Management:    Induction: Intravenous  PONV Risk Score and Plan: 2 and Ondansetron, Dexamethasone, Propofol infusion, Midazolam and Treatment may vary due to age or medical condition  Airway Management Planned: Simple Face Mask  Additional Equipment:   Intra-op Plan:   Post-operative Plan:   Informed Consent: I have reviewed the patients History and Physical, chart, labs and discussed the procedure including the risks, benefits and alternatives for the proposed anesthesia with the patient or authorized representative who has indicated his/her understanding and acceptance.     Dental advisory given  Plan Discussed with: CRNA  Anesthesia Plan Comments:         Anesthesia Quick Evaluation

## 2023-01-15 NOTE — Interval H&P Note (Signed)
History and Physical Interval Note:  01/15/2023 2:33 PM  Marissa Morales  has presented today for surgery, with the diagnosis of left lower quadrant pain, Crohn's disease.  The various methods of treatment have been discussed with the patient and family. After consideration of risks, benefits and other options for treatment, the patient has consented to  Procedure(s): FLEXIBLE SIGMOIDOSCOPY (N/A) as a surgical intervention.  The patient's history has been reviewed, patient examined, no change in status, stable for surgery.  I have reviewed the patient's chart and labs.  Questions were answered to the patient's satisfaction.     Imogene Burn

## 2023-01-15 NOTE — TOC CM/SW Note (Signed)
Transition of Care [facility]) - Inpatient Brief Assessment   Patient Details  Name: Marissa Morales MRN: [id number] Date of Birth: 12-Feb-1996  Transition of Care [facility]) CM/SW Contact:    Darleene Cleaver, LCSW Phone Number: 01/15/2023, 5:00 PM   Clinical Narrative: Patient has insurance, and a PCP.  Patient does not have any SDOH needs.   Transition of Care Asessment: Insurance and Status: Insurance coverage has been reviewed Patient has primary care physician: Yes Home environment has been reviewed: Yes Prior level of function:: Indep Prior/Current Home Services: No current home services Social Determinants of Health Reivew: SDOH reviewed no interventions necessary Readmission risk has been reviewed: Yes Transition of care needs: no transition of care needs at this time

## 2023-01-15 NOTE — Anesthesia Postprocedure Evaluation (Signed)
Anesthesia Post Note  Patient: Tashieka Osthoff  Procedure(s) Performed: FLEXIBLE SIGMOIDOSCOPY BIOPSY     Patient location during evaluation: PACU Anesthesia Type: MAC Level of consciousness: awake and alert Pain management: pain level controlled Vital Signs Assessment: post-procedure vital signs reviewed and stable Respiratory status: spontaneous breathing, nonlabored ventilation, respiratory function stable and patient connected to nasal cannula oxygen Cardiovascular status: stable and blood pressure returned to baseline Postop Assessment: no apparent nausea or vomiting Anesthetic complications: no   No notable events documented.  Last Vitals:  Vitals:   01/15/23 1610 01/15/23 1615  BP: 116/81 115/73  Pulse: 63 78  Resp: 17 (!) 23  Temp:    SpO2: 96% 100%    Last Pain:  Vitals:   01/15/23 1615  TempSrc:   PainSc: 0-No pain                 Zakaria Sedor

## 2023-01-15 NOTE — H&P (View-Only) (Signed)
Daily Progress Note  DOA: 01/14/2023 Hospital Day: 2  Chief Complaint:   ASSESSMENT    Brief Narrative:  Marissa Morales is a 27 y.o. 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 Currently maintained on Skyrizi with  clinical and endoscopic remission on last last colonoscopy Jan 2024. Had C-diff infection in May.  In Oct 2024 developed abdominal pain and blood in stools. Diatherix C-diff negative. Given tapering dose of steroids. Seen in office 11/26 with worsening symptoms on prednisone 20 mg daily. She was sent to ED.   Crohn's disease, maintained on Skyrizi. Admitted with LLQ pain, minor rectal bleeding, recent diarrhea ( Sunday and Monday), now resolved.  Source of pain unclear.  Surprisingly, CTAP without bowel inflammation. Normal CRP and ESR  Chronic constipation  Takes Amitiza 8 mcg BID and daily Miralax but still struggles with constipation. However, she admits to frequently forgetting to take Amitiza.  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   Hgb stable, considering that she has gotten IV fluids. Intolerant of PO iron. Our office arranging for IV Fe+  Principal Problem:   Crohn's disease (HCC)   PLAN   --We were thinking about flexible sigmoidoscopy later today vrs colonoscopy on Friday. Will discuss plan with Dr. Leone Payor in a while.  --Holding on IV steroids for now.  --If no BM by today then will give Linzess since Amitiza not on formulary --Will resume home Dicyclomine 10 gm TID  ADDENDUM:  Will proceed with sigmoidoscopy today. .The risks and benefits of flexible sigmoidoscopy with possible biopsies were discussed and the patient agrees to proceed.    Subjective   No significant abdominal pain right now. Mainly hurts in LLQ  after eating though did have some pain during the night. No BMs in 2 days.    Objective    Recent Labs    01/14/23 1346 01/15/23 0454  WBC 13.0* 11.0*  HGB 12.1 10.7*  HCT 38.7 36.2  PLT 470* 423*   BMET Recent Labs    01/14/23 1346 01/15/23 0454  NA 135 141  K 3.6 3.7  CL 104 106  CO2 21* 25  GLUCOSE 116* 79  BUN 10 7  CREATININE 0.60 0.62  CALCIUM 8.9 9.0   LFT Recent Labs    01/14/23 1346  PROT 7.3  ALBUMIN 4.0  AST 14*  ALT 11  ALKPHOS 68  BILITOT 0.6   PT/INR No results for input(s): "LABPROT", "INR" in the last 72 hours.   Imaging:  CT ABDOMEN PELVIS W CONTRAST CLINICAL DATA:  Crohn's disease with acute exacerbation. Left abdominal pain, nausea/vomiting/diarrhea.  EXAM: CT ABDOMEN AND PELVIS WITH CONTRAST  TECHNIQUE: Multidetector CT imaging of the abdomen and pelvis was performed using the standard protocol following bolus administration of intravenous contrast.  RADIATION DOSE REDUCTION: This exam was performed according to the departmental dose-optimization program which includes automated exposure control, adjustment of the mA and/or kV according to patient size and/or use of iterative reconstruction technique.  CONTRAST:  OMNIPAQUE IOHEXOL 300 MG/ML  SOLN  COMPARISON:  09/08/2022  FINDINGS: Lower chest: No acute abnormality.  Hepatobiliary: Mild focal fatty hepatic infiltration adjacent the falciform ligament. Mild background diffuse hepatic steatosis. No focal enhancing intrahepatic mass. No intra or extrahepatic biliary ductal dilation. Gallbladder unremarkable.  Pancreas: Unremarkable  Spleen: Stable borderline splenomegaly with the spleen measuring 13.7 cm in greatest dimension. No intrasplenic lesion identified. Splenic vein is patent.  Adrenals/Urinary Tract: Adrenal glands are unremarkable. Kidneys are normal, without renal calculi, focal lesion, or hydronephrosis. Bladder is unremarkable.  Stomach/Bowel: The  stomach, small bowel, and large bowel are unremarkable. No evidence of obstruction or focal inflammation. The appendix is normal. No free intraperitoneal gas or fluid.  Vascular/Lymphatic: No significant vascular findings are present. No enlarged abdominal or pelvic lymph nodes.  Reproductive: Uterus and bilateral adnexa are unremarkable.  Other: No abdominal wall hernia or abnormality. No abdominopelvic ascites.  Musculoskeletal: No acute or significant osseous findings.  IMPRESSION: 1. No acute intra-abdominal pathology identified. No definite radiographic explanation for the patient's reported symptoms. 2. Mild hepatic steatosis. 3. Stable borderline splenomegaly.  Electronically Signed   By: Helyn Numbers M.D.   On: 01/14/2023 19:25     Scheduled inpatient medications:   loratadine  10 mg Oral Daily   montelukast  10 mg Oral QHS   traZODone  50 mg Oral QHS   Continuous inpatient infusions:  PRN inpatient medications: ondansetron (ZOFRAN) IV, oxyCODONE-acetaminophen  Vital signs in last 24 hours: Temp:  [97.8 F (36.6 C)-98.4 F (36.9 C)] 98 F (36.7 C) (11/27 0620) Pulse Rate:  [56-109] 56 (11/27 0620) Resp:  [14-19] 16 (11/27 0620) BP: (105-152)/(68-95) 114/72 (11/27 0620) SpO2:  [94 %-100 %] 97 % (11/27 0620) Weight:  [71.2 kg] 71.2 kg (11/26 2238) Last BM Date : 01/15/23  Intake/Output Summary (Last 24 hours) at 01/15/2023 0857 Last data filed at 01/15/2023 0600 Gross per 24 hour  Intake 460 ml  Output 700 ml  Net -240 ml    Intake/Output from previous day: 11/26 0701 - 11/27 0700 In: 460 [P.O.:460] Out: 700 [Urine:700] Intake/Output this shift: No intake/output data recorded.   Physical Exam:  General: Alert female in NAD Heart:  Regular rate and rhythm.  Pulmonary: Normal respiratory effort Abdomen: Soft, nondistended, nontender. Normal bowel sounds. Extremities: No lower extremity edema  Neurologic: Alert and oriented Psych: Pleasant.  Cooperative. Insight appears normal.      LOS: 0 days   Willette Cluster ,NP 01/15/2023, 8:57 AM

## 2023-01-15 NOTE — Op Note (Signed)
[facility] Patient Name: Marissa Morales Procedure Date: 01/15/2023 MRN: [id number] Attending MD: Particia Lather , , [id number] Date of Birth: [birth year] CSN: [id number] Age: 27 Admit Type: Outpatient Procedure:                Flexible Sigmoidoscopy Indications:              Abdominal pain in the left lower quadrant, Personal                            history of Crohn's disease Providers:                Madelyn Brunner" Andreas Ohm, Geoffery Lyons, Technician Referring MD:             Hospitalist team Medicines:                Monitored Anesthesia Care Complications:            No immediate complications. Estimated Blood Loss:     Estimated blood loss was minimal. Procedure:                Pre-Anesthesia Assessment:                           - Prior to the procedure, a History and Physical                            was performed, and patient medications and                            allergies were reviewed. The patient's tolerance of                            previous anesthesia was also reviewed. The risks                            and benefits of the procedure and the sedation                            options and risks were discussed with the patient.                            All questions were answered, and informed consent                            was obtained. Prior Anticoagulants: The patient has                            taken no anticoagulant or antiplatelet agents. ASA                            Grade Assessment: II - A patient with mild systemic  disease. After reviewing the risks and benefits,                            the patient was deemed in satisfactory condition to                            undergo the procedure.                           After obtaining informed consent, the scope was                            passed under direct vision. The [id number]-[id number]                             ([id number]) Olympus colonoscope was introduced                            through the anus and advanced to the the left                            transverse colon. The flexible sigmoidoscopy was                            accomplished without difficulty. The patient                            tolerated the procedure well. Scope In: 3:41:31 PM Scope Out: 3:48:55 PM Total Procedure Duration: 0 hours 7 minutes 24 seconds  Findings:      A moderate amount of stool was found in the transverse colon, making       visualization difficult.      Non-bleeding internal hemorrhoids were found during retroflexion.      The exam was otherwise without abnormality.      Biopsies were taken with a cold forceps in the rectum, in the sigmoid       colon, in the descending colon and in the distal transverse colon for       histology. Impression:               - Stool in the transverse colon.                           - Non-bleeding internal hemorrhoids.                           - The examination was otherwise normal.                           - Biopsies were taken with a cold forceps for                            histology in the rectum, in the sigmoid colon, in                            the descending colon and in the  distal transverse                            colon. Moderate Sedation:      Not Applicable - Patient had care per Anesthesia. Recommendation:           - Return patient to hospital ward for ongoing care.                           - There does not appear to be any endoscopic                            evidence of active Crohn's disease. Would continue                            treatment for constipation.                           - Await pathology results.                           - The findings and recommendations were discussed                            with the patient. Procedure Code(s):        --- Professional ---                           [phone], Sigmoidoscopy,  flexible; with biopsy, single                            or multiple Diagnosis Code(s):        --- Professional ---                           K64.8, Other hemorrhoids                           R10.32, Left lower quadrant pain CPT copyright 2022 [employer]. All rights reserved. The codes documented in this report are preliminary and upon coder review may  be revised to meet current compliance requirements. Dr Particia Lather "[name]" [name],  01/15/2023 3:52:55 PM Number of Addenda: 0

## 2023-01-17 ENCOUNTER — Encounter (HOSPITAL_COMMUNITY): Payer: Self-pay | Admitting: Internal Medicine

## 2023-01-17 LAB — SURGICAL PATHOLOGY

## 2023-01-17 NOTE — Discharge Summary (Signed)
Physician Discharge Summary   Patient: Marissa Morales MRN: [id number] DOB: Aug 20, 1995  Admit date:     01/14/2023  Discharge date: 01/15/2023  Discharge Physician: Lynden Oxford  PCP: Genia Hotter, FNP  Recommendations at discharge: Follow-up with GI as recommended. Follow-up with PCP in 1 week with a CBC.   Follow-up Information     Stamey, Verda Cumins, FNP. Schedule an appointment as soon as possible for a visit in 1 week(s).   Specialty: Family Medicine Why: with CBC lab to look at blood counts Contact information: [address] [place] [address] [phone]         [facility]. Call in 1 week(s).   Specialty: Gastroenterology Why: to ensure that a follow up is scheduled. Contact information: [address] [place] [address] [phone]               Discharge Diagnoses: Principal Problem:   Crohn's disease [facility])  Hospital Course: Nausea/vomiting/diarrhea/LLQ pain/chronic disease- Patient reports progressive LLQ pain for about 3 weeks and loose stool after each meals.   One episode of emesis in the last 4 to 5 days but ongoing nausea. Abdominal exam and CT abdomen and pelvis benign.   She has no fever.  Low suspicion for C. Difficile. GI was consulted. Underwent flexible sigmoidoscopy which did not show any evidence of active Crohn's disease. Currently GI recommending patient can be discharged from their perspective with constipation treatment. Patient tolerated oral diet before discharge.   Mild intermittent asthma/environmental allergy -Continue home meds   Anxiety and depression -Continue home meds  Consultants:  GI  Procedures performed:  Flexible sigmoidoscopy  DISCHARGE MEDICATION: Allergies as of 01/15/2023       Reactions   Wound Dressing Adhesive Other (See Comments)   "Burns" the skin- SKIN IS VERY SENSITIVE!!   Bactrim [sulfamethoxazole-trimethoprim] Nausea And  Vomiting   Bismuth Subsalicylate Nausea And Vomiting   Bismuth-containing Compounds Nausea And Vomiting   Cefpodoxime Nausea And Vomiting, Other (See Comments)   Childhood rx - GI upset   Doxycycline Nausea And Vomiting   Ibuprofen Other (See Comments)   Cannot take due to Crohn's disease   Silicone Other (See Comments)   BURNS AND PULLS OFF THE SKIN   Vancomycin Itching, Other (See Comments)   Severe pruritus of scalp, flushing Red Man Syndrome        Medication List     STOP taking these medications    cephALEXin 500 MG capsule Commonly known as: KEFLEX   linaclotide 72 MCG capsule Commonly known as: Linzess       TAKE these medications    albuterol (2.5 MG/3ML) 0.083% nebulizer solution Commonly known as: PROVENTIL Take 2.5 mg by nebulization every 6 (six) hours as needed for wheezing or shortness of breath.   albuterol 108 (90 Base) MCG/ACT inhaler Commonly known as: VENTOLIN HFA Inhale 1-2 puffs into the lungs every 6 (six) hours as needed for wheezing or shortness of breath.   Azelastine-Fluticasone 137-50 MCG/ACT Susp Place 1 spray into the nose daily. What changed: how to take this   cetirizine 10 MG tablet Commonly known as: ZYRTEC Take 10 mg by mouth daily.   clindamycin 1 % Swab Commonly known as: CLEOCIN T Apply 1 Application topically See admin instructions. Apply to the back once a day   clobetasol cream 0.05 % Commonly known as: TEMOVATE Apply 1 Application topically 2 (two) times daily as needed (irritation- affected areas).   diclofenac  Sodium 1 % Gel Commonly known as: VOLTAREN Apply 2-3 g topically 4 (four) times daily as needed (pain).   dicyclomine 10 MG capsule Commonly known as: BENTYL TAKE 1 CAPSULE 2-3 TIMES A DAY AS NEEDED FOR ABDOMINAL CRAMPS What changed: See the new instructions.   hydrocortisone cream 1 % Apply to affected area 2 times daily What changed:  how much to take how to take this when to take this reasons  to take this additional instructions   hydrOXYzine 25 MG tablet Commonly known as: ATARAX Take 25 mg by mouth daily as needed for anxiety (or panic attacks).   lubiprostone 8 MCG capsule Commonly known as: AMITIZA TAKE 1 CAPSULE (8 MCG TOTAL) BY MOUTH 2 (TWO) TIMES DAILY WITH A MEAL.   montelukast 10 MG tablet Commonly known as: SINGULAIR Take 1 tablet (10 mg total) by mouth at bedtime.   mupirocin ointment 2 % Commonly known as: BACTROBAN Apply 1 Application topically 2 (two) times daily as needed (for irritation).   omeprazole 40 MG capsule Commonly known as: PRILOSEC Take 1 capsule (40 mg total) by mouth daily. What changed: when to take this   ondansetron 4 MG tablet Commonly known as: ZOFRAN TAKE 1 TABLET BY MOUTH EVERY 8 HOURS AS NEEDED FOR NAUSEA AND VOMITING What changed: See the new instructions.   oxyCODONE-acetaminophen 5-325 MG tablet Commonly known as: Percocet Take 1-2 tablets by mouth every 6 (six) hours as needed for severe pain (pain score 7-10).   polyethylene glycol 17 g packet Commonly known as: MiraLax Take 17 g by mouth daily as needed for mild constipation.   predniSONE 10 MG tablet Commonly known as: DELTASONE Take 4 tablets (40 mg total) by mouth daily with breakfast for 7 days, THEN 3 tablets (30 mg total) daily with breakfast for 7 days, THEN 2 tablets (20 mg total) daily with breakfast for 7 days, THEN 1 tablet (10 mg total) daily with breakfast for 7 days. Start taking on: December 27, 2022   PRESCRIPTION MEDICATION Inhale into the lungs See admin instructions. CPAP- At bedtime and during any time of rest   sertraline 50 MG tablet Commonly known as: ZOLOFT Take 50 mg by mouth in the morning.   Skyrizi 180 MG/1.2ML Soct Generic drug: Risankizumab-rzaa Inject (1.2 ml) under the skin every 8 weeks What changed:  how much to take how to take this when to take this additional instructions   traZODone 50 MG tablet Commonly known as:  DESYREL TAKE 1 TABLET BY MOUTH EVERYDAY AT BEDTIME What changed: See the new instructions.   Wixela Inhub 250-50 MCG/ACT Aepb Generic drug: fluticasone-salmeterol Inhale 1 puff into the lungs in the morning and at bedtime. What changed: Another medication with the same name was removed. Continue taking this medication, and follow the directions you see here.       Disposition: Home Diet recommendation: Regular diet  Discharge Exam: Vitals:   01/15/23 1558 01/15/23 1600 01/15/23 1610 01/15/23 1615  BP:  122/61 116/81 115/73  Pulse:  66 63 78  Resp:  11 17 (!) 23  Temp: 97.9 F (36.6 C)     TempSrc: Temporal     SpO2:  96% 96% 100%  Weight:      Height:       General: Appear in no distress; no visible Abnormal Neck Mass Or lumps, Conjunctiva normal Cardiovascular: S1 and S2 Present, no Murmur, Respiratory: good respiratory effort, Bilateral Air entry present and CTA, no Crackles, no wheezes Abdomen: Bowel  Sound present, Non tender  Extremities: no Pedal edema Neurology: alert and oriented to time, place, and person  Filed Weights   01/14/23 2238 01/15/23 1400  Weight: 71.2 kg 71.2 kg   Condition at discharge: stable  The results of significant diagnostics from this hospitalization (including imaging, microbiology, ancillary and laboratory) are listed below for reference.   Imaging Studies: CT ABDOMEN PELVIS W CONTRAST  Result Date: 01/14/2023 CLINICAL DATA:  Crohn's disease with acute exacerbation. Left abdominal pain, nausea/vomiting/diarrhea. EXAM: CT ABDOMEN AND PELVIS WITH CONTRAST TECHNIQUE: Multidetector CT imaging of the abdomen and pelvis was performed using the standard protocol following bolus administration of intravenous contrast. RADIATION DOSE REDUCTION: This exam was performed according to the departmental dose-optimization program which includes automated exposure control, adjustment of the mA and/or kV according to patient size and/or use of iterative  reconstruction technique. CONTRAST:  OMNIPAQUE IOHEXOL 300 MG/ML  SOLN COMPARISON:  09/08/2022 FINDINGS: Lower chest: No acute abnormality. Hepatobiliary: Mild focal fatty hepatic infiltration adjacent the falciform ligament. Mild background diffuse hepatic steatosis. No focal enhancing intrahepatic mass. No intra or extrahepatic biliary ductal dilation. Gallbladder unremarkable. Pancreas: Unremarkable Spleen: Stable borderline splenomegaly with the spleen measuring 13.7 cm in greatest dimension. No intrasplenic lesion identified. Splenic vein is patent. Adrenals/Urinary Tract: Adrenal glands are unremarkable. Kidneys are normal, without renal calculi, focal lesion, or hydronephrosis. Bladder is unremarkable. Stomach/Bowel: The stomach, small bowel, and large bowel are unremarkable. No evidence of obstruction or focal inflammation. The appendix is normal. No free intraperitoneal gas or fluid. Vascular/Lymphatic: No significant vascular findings are present. No enlarged abdominal or pelvic lymph nodes. Reproductive: Uterus and bilateral adnexa are unremarkable. Other: No abdominal wall hernia or abnormality. No abdominopelvic ascites. Musculoskeletal: No acute or significant osseous findings. IMPRESSION: 1. No acute intra-abdominal pathology identified. No definite radiographic explanation for the patient's reported symptoms. 2. Mild hepatic steatosis. 3. Stable borderline splenomegaly. Electronically Signed   By: Helyn Numbers M.D.   On: 01/14/2023 19:25    Microbiology: Results for orders placed or performed during the hospital encounter of 06/16/20  Resp Panel by RT-PCR (Flu A&B, Covid) Nasopharyngeal Swab     Status: None   Collection Time: 06/16/20 11:47 PM   Specimen: Nasopharyngeal Swab; Nasopharyngeal(NP) swabs in vial transport medium  Result Value Ref Range Status   SARS Coronavirus 2 by RT PCR NEGATIVE NEGATIVE Final    Comment: (NOTE) SARS-CoV-2 target nucleic acids are NOT  DETECTED.  The SARS-CoV-2 RNA is generally detectable in upper respiratory specimens during the acute phase of infection. The lowest concentration of SARS-CoV-2 viral copies this assay can detect is 138 copies/mL. A negative result does not preclude SARS-Cov-2 infection and should not be used as the sole basis for treatment or other patient management decisions. A negative result may occur with  improper specimen collection/handling, submission of specimen other than nasopharyngeal swab, presence of viral mutation(s) within the areas targeted by this assay, and inadequate number of viral copies(<138 copies/mL). A negative result must be combined with clinical observations, patient history, and epidemiological information. The expected result is Negative.  Fact Sheet for Patients:  BloggerCourse.com  Fact Sheet for Healthcare Providers:  SeriousBroker.it  This test is no t yet approved or cleared by the [place] FDA and  has been authorized for detection and/or diagnosis of SARS-CoV-2 by FDA under an Emergency Use Authorization (EUA). This EUA will remain  in effect (meaning this test can be used) for the duration of the COVID-19 declaration under Section 564(b)(1)  of the Act, 21 U.S.C.section 360bbb-3(b)(1), unless the authorization is terminated  or revoked sooner.       Influenza A by PCR NEGATIVE NEGATIVE Final   Influenza B by PCR NEGATIVE NEGATIVE Final    Comment: (NOTE) The Xpert Xpress SARS-CoV-2/FLU/RSV plus assay is intended as an aid in the diagnosis of influenza from Nasopharyngeal swab specimens and should not be used as a sole basis for treatment. Nasal washings and aspirates are unacceptable for Xpert Xpress SARS-CoV-2/FLU/RSV testing.  Fact Sheet for Patients: BloggerCourse.com  Fact Sheet for Healthcare Providers: SeriousBroker.it  This test is not yet  approved or cleared by the [place] FDA and has been authorized for detection and/or diagnosis of SARS-CoV-2 by FDA under an Emergency Use Authorization (EUA). This EUA will remain in effect (meaning this test can be used) for the duration of the COVID-19 declaration under Section 564(b)(1) of the Act, 21 U.S.C. section 360bbb-3(b)(1), unless the authorization is terminated or revoked.  Performed at [facility], [address] [place], [place] [address]    Labs: CBC: Recent Labs  Lab 01/14/23 1346 01/15/23 0454  WBC 13.0* 11.0*  NEUTROABS  --  5.0  HGB 12.1 10.7*  HCT 38.7 36.2  MCV 76.6* 79.0*  PLT 470* 423*   Basic Metabolic Panel: Recent Labs  Lab 01/14/23 1346 01/15/23 0454  NA 135 141  K 3.6 3.7  CL 104 106  CO2 21* 25  GLUCOSE 116* 79  BUN 10 7  CREATININE 0.60 0.62  CALCIUM 8.9 9.0   Liver Function Tests: Recent Labs  Lab 01/14/23 1346  AST 14*  ALT 11  ALKPHOS 68  BILITOT 0.6  PROT 7.3  ALBUMIN 4.0   CBG: No results for input(s): "GLUCAP" in the last 168 hours.  Discharge time spent: greater than 30 minutes.  Author: Lynden Oxford, MD  [facility] 01/15/2023

## 2023-01-18 ENCOUNTER — Other Ambulatory Visit: Payer: Self-pay | Admitting: Gastroenterology

## 2023-01-20 ENCOUNTER — Other Ambulatory Visit: Payer: Self-pay

## 2023-01-21 ENCOUNTER — Encounter: Payer: Self-pay | Admitting: Gastroenterology

## 2023-01-23 ENCOUNTER — Ambulatory Visit (INDEPENDENT_AMBULATORY_CARE_PROVIDER_SITE_OTHER): Payer: 59

## 2023-01-23 VITALS — BP 115/70 | HR 79 | Temp 98.5°F | Resp 16 | Ht 61.0 in | Wt 160.6 lb

## 2023-01-23 DIAGNOSIS — D500 Iron deficiency anemia secondary to blood loss (chronic): Secondary | ICD-10-CM | POA: Diagnosis not present

## 2023-01-23 DIAGNOSIS — K50811 Crohn's disease of both small and large intestine with rectal bleeding: Secondary | ICD-10-CM

## 2023-01-23 MED ORDER — IRON SUCROSE 20 MG/ML IV SOLN
200.0000 mg | Freq: Once | INTRAVENOUS | Status: AC
  Administered 2023-01-23: 200 mg via INTRAVENOUS
  Filled 2023-01-23: qty 10

## 2023-01-23 MED ORDER — DIPHENHYDRAMINE HCL 25 MG PO CAPS
25.0000 mg | ORAL_CAPSULE | Freq: Once | ORAL | Status: AC
Start: 2023-01-23 — End: 2023-01-23
  Administered 2023-01-23: 25 mg via ORAL
  Filled 2023-01-23: qty 1

## 2023-01-23 MED ORDER — ACETAMINOPHEN 325 MG PO TABS
650.0000 mg | ORAL_TABLET | Freq: Once | ORAL | Status: AC
Start: 2023-01-23 — End: 2023-01-23
  Administered 2023-01-23: 650 mg via ORAL
  Filled 2023-01-23: qty 2

## 2023-01-23 NOTE — Progress Notes (Signed)
 Diagnosis: Iron Deficiency Anemia  Provider:  Chilton Greathouse MD  Procedure: IV Push  IV Type: Peripheral, IV Location: L Antecubital  Venofer (Iron Sucrose), Dose: 200 mg  Post Infusion IV Care: Observation period completed and Peripheral IV Discontinued  Discharge: Condition: Good, Destination: Home . AVS Declined  Performed by:  Rico Ala, LPN

## 2023-01-27 ENCOUNTER — Ambulatory Visit (INDEPENDENT_AMBULATORY_CARE_PROVIDER_SITE_OTHER): Payer: 59

## 2023-01-27 VITALS — BP 113/71 | HR 63 | Temp 97.9°F | Resp 18 | Ht 61.0 in | Wt 160.8 lb

## 2023-01-27 DIAGNOSIS — K5190 Ulcerative colitis, unspecified, without complications: Secondary | ICD-10-CM

## 2023-01-27 DIAGNOSIS — K51319 Ulcerative (chronic) rectosigmoiditis with unspecified complications: Secondary | ICD-10-CM

## 2023-01-27 DIAGNOSIS — K50811 Crohn's disease of both small and large intestine with rectal bleeding: Secondary | ICD-10-CM

## 2023-01-27 DIAGNOSIS — D500 Iron deficiency anemia secondary to blood loss (chronic): Secondary | ICD-10-CM | POA: Diagnosis not present

## 2023-01-27 MED ORDER — ACETAMINOPHEN 325 MG PO TABS
650.0000 mg | ORAL_TABLET | Freq: Once | ORAL | Status: AC
Start: 2023-01-27 — End: 2023-01-27
  Administered 2023-01-27: 650 mg via ORAL
  Filled 2023-01-27: qty 2

## 2023-01-27 MED ORDER — IRON SUCROSE 20 MG/ML IV SOLN
200.0000 mg | Freq: Once | INTRAVENOUS | Status: AC
  Administered 2023-01-27: 200 mg via INTRAVENOUS
  Filled 2023-01-27: qty 10

## 2023-01-27 MED ORDER — DIPHENHYDRAMINE HCL 25 MG PO CAPS
25.0000 mg | ORAL_CAPSULE | Freq: Once | ORAL | Status: AC
Start: 2023-01-27 — End: 2023-01-27
  Administered 2023-01-27: 25 mg via ORAL
  Filled 2023-01-27: qty 1

## 2023-01-27 NOTE — Progress Notes (Signed)
 Diagnosis: Iron Deficiency Anemia  Provider:  Chilton Greathouse MD  Procedure: IV Push  IV Type: Peripheral, IV Location: R Antecubital  Venofer (Iron Sucrose), Dose: 200 mg  Post Infusion IV Care: Patient declined observation and Peripheral IV Discontinued  Discharge: Condition: Good, Destination: Home . AVS Declined  Performed by:  Adriana Mccallum, RN

## 2023-01-29 ENCOUNTER — Ambulatory Visit (INDEPENDENT_AMBULATORY_CARE_PROVIDER_SITE_OTHER): Payer: 59

## 2023-01-29 VITALS — BP 129/77 | HR 62 | Temp 98.6°F | Resp 18 | Ht 61.0 in | Wt 160.2 lb

## 2023-01-29 DIAGNOSIS — K51319 Ulcerative (chronic) rectosigmoiditis with unspecified complications: Secondary | ICD-10-CM

## 2023-01-29 DIAGNOSIS — K50811 Crohn's disease of both small and large intestine with rectal bleeding: Secondary | ICD-10-CM

## 2023-01-29 DIAGNOSIS — D500 Iron deficiency anemia secondary to blood loss (chronic): Secondary | ICD-10-CM

## 2023-01-29 MED ORDER — ACETAMINOPHEN 325 MG PO TABS
650.0000 mg | ORAL_TABLET | Freq: Once | ORAL | Status: AC
  Administered 2023-01-29: 650 mg via ORAL
  Filled 2023-01-29: qty 2

## 2023-01-29 MED ORDER — DIPHENHYDRAMINE HCL 25 MG PO CAPS
25.0000 mg | ORAL_CAPSULE | Freq: Once | ORAL | Status: AC
  Administered 2023-01-29: 25 mg via ORAL
  Filled 2023-01-29: qty 1

## 2023-01-29 MED ORDER — SODIUM CHLORIDE 0.9 % IV BOLUS
250.0000 mL | Freq: Once | INTRAVENOUS | Status: AC
Start: 2023-01-29 — End: 2023-01-29
  Administered 2023-01-29: 250 mL via INTRAVENOUS
  Filled 2023-01-29: qty 250

## 2023-01-29 MED ORDER — IRON SUCROSE 20 MG/ML IV SOLN
200.0000 mg | Freq: Once | INTRAVENOUS | Status: AC
Start: 2023-01-29 — End: 2023-01-29
  Administered 2023-01-29: 200 mg via INTRAVENOUS
  Filled 2023-01-29: qty 10

## 2023-01-29 NOTE — Progress Notes (Signed)
Diagnosis: Acute Anemia  Provider:  Chilton Greathouse MD  Procedure: IV Push  IV Type: Peripheral, IV Location: L Hand  Venofer (Iron Sucrose), Dose: 200 mg  Patient complained of pain at IV site and in upper arm. Flushed line with additional saline and patient stated pain resolved. Attempted to push additional iron, but patient stated the pain returned. Hung additional NS and administered the remainder of the IV iron with NS running. Patient complained of mild but tolerable pain that resolved completely after the iron infusion was complete and the IV was flushed.  Post Infusion IV Care: Observation period completed and Peripheral IV Discontinued  Discharge: Condition: Stable, Destination: Home . AVS Declined  Performed by:  Wyvonne Lenz, RN

## 2023-02-02 MED ORDER — IRON SUCROSE 20 MG/ML IV SOLN
200.0000 mg | Freq: Once | INTRAVENOUS | Status: AC
  Administered 2023-02-03: 200 mg via INTRAVENOUS
  Filled 2023-02-02: qty 10

## 2023-02-02 MED ORDER — ACETAMINOPHEN 325 MG PO TABS
650.0000 mg | ORAL_TABLET | Freq: Once | ORAL | Status: AC
  Administered 2023-02-03: 650 mg via ORAL
  Filled 2023-02-02: qty 2

## 2023-02-02 MED ORDER — DIPHENHYDRAMINE HCL 25 MG PO CAPS
25.0000 mg | ORAL_CAPSULE | Freq: Once | ORAL | Status: AC
  Administered 2023-02-03: 25 mg via ORAL
  Filled 2023-02-02: qty 1

## 2023-02-03 ENCOUNTER — Ambulatory Visit (INDEPENDENT_AMBULATORY_CARE_PROVIDER_SITE_OTHER): Payer: 59

## 2023-02-03 VITALS — BP 123/82 | HR 71 | Temp 98.3°F | Resp 18 | Ht 61.0 in | Wt 160.8 lb

## 2023-02-03 DIAGNOSIS — K51319 Ulcerative (chronic) rectosigmoiditis with unspecified complications: Secondary | ICD-10-CM

## 2023-02-03 DIAGNOSIS — D500 Iron deficiency anemia secondary to blood loss (chronic): Secondary | ICD-10-CM

## 2023-02-03 NOTE — Progress Notes (Signed)
Diagnosis: Acute Anemia  Provider:  Chilton Greathouse MD  Procedure: IV Push  IV Type: Peripheral, IV Location: L Antecubital  Venofer (Iron Sucrose), Dose: 200 mg  Post Infusion IV Care: Patient declined observation and Peripheral IV Discontinued  Discharge: Condition: Good, Destination: Home . AVS Declined  Performed by:  Nat Math, RN

## 2023-02-05 ENCOUNTER — Other Ambulatory Visit (HOSPITAL_COMMUNITY): Payer: Self-pay

## 2023-02-05 ENCOUNTER — Ambulatory Visit (INDEPENDENT_AMBULATORY_CARE_PROVIDER_SITE_OTHER): Payer: 59

## 2023-02-05 VITALS — BP 111/72 | HR 74 | Temp 98.1°F | Resp 20 | Ht 61.0 in | Wt 161.2 lb

## 2023-02-05 DIAGNOSIS — K51319 Ulcerative (chronic) rectosigmoiditis with unspecified complications: Secondary | ICD-10-CM | POA: Diagnosis not present

## 2023-02-05 DIAGNOSIS — J4530 Mild persistent asthma, uncomplicated: Secondary | ICD-10-CM | POA: Diagnosis not present

## 2023-02-05 DIAGNOSIS — Z124 Encounter for screening for malignant neoplasm of cervix: Secondary | ICD-10-CM | POA: Diagnosis not present

## 2023-02-05 DIAGNOSIS — F418 Other specified anxiety disorders: Secondary | ICD-10-CM | POA: Diagnosis not present

## 2023-02-05 DIAGNOSIS — R21 Rash and other nonspecific skin eruption: Secondary | ICD-10-CM | POA: Diagnosis not present

## 2023-02-05 DIAGNOSIS — Z0000 Encounter for general adult medical examination without abnormal findings: Secondary | ICD-10-CM | POA: Diagnosis not present

## 2023-02-05 DIAGNOSIS — D500 Iron deficiency anemia secondary to blood loss (chronic): Secondary | ICD-10-CM | POA: Diagnosis not present

## 2023-02-05 DIAGNOSIS — J309 Allergic rhinitis, unspecified: Secondary | ICD-10-CM | POA: Diagnosis not present

## 2023-02-05 MED ORDER — DIPHENHYDRAMINE HCL 25 MG PO CAPS
25.0000 mg | ORAL_CAPSULE | Freq: Once | ORAL | Status: AC
  Administered 2023-02-05: 25 mg via ORAL
  Filled 2023-02-05: qty 1

## 2023-02-05 MED ORDER — ACETAMINOPHEN 325 MG PO TABS
650.0000 mg | ORAL_TABLET | Freq: Once | ORAL | Status: AC
  Administered 2023-02-05: 650 mg via ORAL
  Filled 2023-02-05: qty 2

## 2023-02-05 MED ORDER — IRON SUCROSE 20 MG/ML IV SOLN
200.0000 mg | Freq: Once | INTRAVENOUS | Status: AC
  Administered 2023-02-05: 200 mg via INTRAVENOUS
  Filled 2023-02-05: qty 10

## 2023-02-05 NOTE — Progress Notes (Signed)
Diagnosis: Acute Anemia  Provider:  Chilton Greathouse MD  Procedure: IV Push  IV Type: Peripheral, IV Location: R Antecubital  Venofer (Iron Sucrose), Dose: 200 mg  Post Infusion IV Care: Patient declined observation and Peripheral IV Discontinued  Discharge: Condition: Good, Destination: Home . AVS Declined  Performed by:  Nat Math, RN

## 2023-02-06 DIAGNOSIS — G4733 Obstructive sleep apnea (adult) (pediatric): Secondary | ICD-10-CM | POA: Diagnosis not present

## 2023-02-27 ENCOUNTER — Encounter: Payer: Self-pay | Admitting: Gastroenterology

## 2023-03-03 ENCOUNTER — Encounter: Payer: Self-pay | Admitting: Gastroenterology

## 2023-03-03 NOTE — Telephone Encounter (Signed)
 See message from patient.  Would be best if she did not come to the office with a respiratory infection. She is my last clinic patient that day, so it could be converted to a telemedicine visit if she wants.  - HD

## 2023-03-06 ENCOUNTER — Telehealth: Payer: Self-pay

## 2023-03-06 ENCOUNTER — Encounter: Payer: Self-pay | Admitting: Gastroenterology

## 2023-03-06 ENCOUNTER — Telehealth (INDEPENDENT_AMBULATORY_CARE_PROVIDER_SITE_OTHER): Payer: No Typology Code available for payment source | Admitting: Gastroenterology

## 2023-03-06 VITALS — Wt 160.0 lb

## 2023-03-06 DIAGNOSIS — Z7960 Long term (current) use of unspecified immunomodulators and immunosuppressants: Secondary | ICD-10-CM

## 2023-03-06 DIAGNOSIS — K5909 Other constipation: Secondary | ICD-10-CM

## 2023-03-06 DIAGNOSIS — K625 Hemorrhage of anus and rectum: Secondary | ICD-10-CM

## 2023-03-06 DIAGNOSIS — K50811 Crohn's disease of both small and large intestine with rectal bleeding: Secondary | ICD-10-CM | POA: Diagnosis not present

## 2023-03-06 DIAGNOSIS — R1032 Left lower quadrant pain: Secondary | ICD-10-CM | POA: Diagnosis not present

## 2023-03-06 NOTE — Progress Notes (Signed)
Telemedicine Visit:  Participants on the conference : myself and patient   The patient consented to this consultation and was aware that a charge will be placed through their insurance.  They were also made aware of the limitations of telemedicine.  I was in my office and the patient was at home.   Encounter time:  Total time 22 minutes, with 15 minutes spent with patient on [facility] (audio and video)   _____________________________________________________________________________________________              [facility] Progress Note  Chief Complaint: Rectosigmoid and ileal Crohn's disease   Summary of GI issues:  Years of marked colitis of the rectosigmoid junction, difficult to control on multiple meds, failed anti-TNF, failed Entyvio, finally evolved into more of a Crohn's-like picture including mild terminal ileitis.  Started on McClure and had complete symptomatic and mucosal remission on last colonoscopy January 2024.  Was doing well at clinic follow-up April 2024. History of anal fissure and C. difficile infection May 2024 office visit for acute symptoms of probable gastrointestinal infection,, tested negative for C. difficile. 15 mm cecal SSP removed March 2021.  Diminutive transverse colon tubular adenoma September 2022.  No polyps on January 2024 diagnostic colonoscopy, recall for polyp surveillance at January 2027  Chronic constipation requiring prescription medication, previously normal anorectal manometry.   Seen 12/24/2022 for an apparent flare of symptoms with lower abdominal/pelvic pain and bleeding reminiscent of prior flares.  Clinical details in office note of that day.  Diatherix C. difficile swab negative, and then prednisone taper started. Recurrent iron deficiency anemia believed due to her ongoing chronic disease related blood loss.  Previously intolerant of oral iron with abdominal pain and worsened chronic constipation.  IV iron ordered (which she had  tolerated well in the past), though we were just notified by her that insurance declined it.  Clinical nursing will contact pharmacy staff to work on the prior authorization  Clinic visit 01/14/2023 patient was feeling worse with abdominal/pelvic pain and hematochezia despite negative C. difficile testing and treatment with prednisone.  Concern for Crohn's flare led to a brief hospitalization that day. Had a CT scan without acute changes Sigmoidoscopy by Dr. [name] with poor prep/retained stool.  However, visualization sufficient to see that there is no active Crohn's inflammation in the rectosigmoid area and no active bleeding. Clinical impression was that her symptoms were related to constipation, and thus her Skyrizi treatment was continued.  Subjective  History:  Marissa Morales is glad to report that she feels back to her usual self.  She had Skyrizi in mid December and is due for it in mid February.  No further rectal bleeding.  Still has intermittent constipation for which she takes Amitiza 8 mcg twice daily.  Sometimes it does not seem like it works as well as it should and she might have to take a MiraLAX dose.  She expressed some concern that taking MiraLAX on a regular basis might make her tolerant of it, but I reassured her that is not known to be the case.  Her appetite is returned to normal she has very infrequent abdominal/pelvic pain and only took a couple doses pain meds since leaving the hospital. She is not sure what precipitated this degree of constipation and pain leading to the hospitalization, but also wonders if some stress or anxiety could have contributed.  She has been on a new medicine for anxiety lately and says it is helping a lot. She is in nursing school and  hoping to have the financial stability to get married later this year. ROS: Cardiovascular:  no chest pain Respiratory: no dyspnea  The patient's Past Medical, Family and Social History were reviewed and are on file in  the EMR. Past Medical History:  Diagnosis Date   Allergy    cats   Anemia    Anxiety    panic attacks   Asthma    as a child - dirty cats will cause an asthma attack   Crohn's disease (HCC)    Depression    GERD (gastroesophageal reflux disease)    Hemorrhoids    History of asthma    childhood   History of kidney stones    IBS (irritable bowel syndrome)    Pilonidal cyst    Sleep apnea    Wears glasses     Past Surgical History:  Procedure Laterality Date   ANAL RECTAL MANOMETRY N/A 07/24/2020   Procedure: ANO RECTAL MANOMETRY;  Surgeon: Sherrilyn Rist, MD;  Location: [facility];  Service: Gastroenterology;  Laterality: N/A;   BIOPSY  01/15/2023   Procedure: BIOPSY;  Surgeon: Imogene Burn, MD;  Location: [facility];  Service: Gastroenterology;;   COLONOSCOPY     COLONOSCOPY WITH ESOPHAGOGASTRODUODENOSCOPY (EGD)  x2  last one 2015   FLEXIBLE SIGMOIDOSCOPY N/A 01/15/2023   Procedure: FLEXIBLE SIGMOIDOSCOPY;  Surgeon: Imogene Burn, MD;  Location: [facility];  Service: Gastroenterology;  Laterality: N/A;   KNEE ARTHROSCOPY Left 03/30/2020   LIPOMA EXCISION N/A 08/28/2022   Procedure: EXCISION OF SUBCUTANEOUS CYST UPPER BACK;  Surgeon: Berna Bue, MD;  Location: [facility];  Service: General;  Laterality: N/A;   PILONIDAL CYST EXCISION N/A 07/18/2017   Procedure: CYST EXCISION PILONIDAL;  Surgeon: Berna Bue, MD;  Location: [facility];  Service: General;  Laterality: N/A;   UPPER GASTROINTESTINAL ENDOSCOPY     WISDOM TOOTH EXTRACTION  2015    Objective:  Med list reviewed  Current Outpatient Medications:    albuterol (PROVENTIL) (2.5 MG/3ML) 0.083% nebulizer solution, Take 2.5 mg by nebulization every 6 (six) hours as needed for wheezing or shortness of breath., Disp: , Rfl:    albuterol (VENTOLIN HFA) 108 (90 Base) MCG/ACT inhaler, Inhale 1-2 puffs into the lungs every 6 (six) hours as needed for wheezing or shortness of breath.,  Disp: , Rfl:    Azelastine-Fluticasone 137-50 MCG/ACT SUSP, Place 1 spray into the nose daily. (Patient taking differently: Place 1 spray into both nostrils daily.), Disp: 23 g, Rfl: 11   busPIRone (BUSPAR) 15 MG tablet, Take 7.5 mg by mouth 2 (two) times daily., Disp: , Rfl:    cetirizine (ZYRTEC) 10 MG tablet, Take 10 mg by mouth daily., Disp: , Rfl:    clindamycin (CLEOCIN T) 1 % SWAB, Apply 1 Application topically See admin instructions. Apply to the back once a day, Disp: , Rfl:    clobetasol cream (TEMOVATE) 0.05 %, Apply 1 Application topically 2 (two) times daily as needed (irritation- affected areas)., Disp: , Rfl:    diclofenac Sodium (VOLTAREN) 1 % GEL, Apply 2-3 g topically 4 (four) times daily as needed (pain)., Disp: , Rfl:    dicyclomine (BENTYL) 10 MG capsule, TAKE 1 CAPSULE 2-3 TIMES A DAY AS NEEDED FOR ABDOMINAL CRAMPS (Patient taking differently: Take 10 mg by mouth See admin instructions. TAKE 10 MG BY MOUTH 2-3 TIMES A DAY AS NEEDED FOR ABDOMINAL CRAMPS), Disp: 60 capsule, Rfl: 1   fluticasone-salmeterol (WIXELA INHUB) 250-50 MCG/ACT AEPB,  Inhale 1 puff into the lungs in the morning and at bedtime., Disp: , Rfl:    hydrocortisone cream 1 %, Apply to affected area 2 times daily (Patient taking differently: Apply 1 Application topically 2 (two) times daily as needed for itching.), Disp: 15 g, Rfl: 0   hydrOXYzine (ATARAX) 25 MG tablet, Take 25 mg by mouth daily as needed for anxiety (or panic attacks)., Disp: , Rfl:    lubiprostone (AMITIZA) 8 MCG capsule, TAKE 1 CAPSULE (8 MCG TOTAL) BY MOUTH 2 (TWO) TIMES DAILY WITH A MEAL., Disp: 180 capsule, Rfl: 0   montelukast (SINGULAIR) 10 MG tablet, Take 1 tablet (10 mg total) by mouth at bedtime., Disp: 30 tablet, Rfl: 3   mupirocin ointment (BACTROBAN) 2 %, Apply 1 Application topically 2 (two) times daily as needed (for irritation)., Disp: , Rfl:    omeprazole (PRILOSEC) 40 MG capsule, Take 1 capsule (40 mg total) by mouth daily. (Patient  taking differently: Take 40 mg by mouth daily before breakfast.), Disp: 90 capsule, Rfl: 2   ondansetron (ZOFRAN) 4 MG tablet, TAKE 1 TABLET BY MOUTH EVERY 8 HOURS AS NEEDED FOR NAUSEA AND VOMITING (Patient taking differently: Take 8 mg by mouth every 8 (eight) hours as needed for nausea or vomiting.), Disp: 60 tablet, Rfl: 1   oxyCODONE-acetaminophen (PERCOCET) 5-325 MG tablet, Take 1-2 tablets by mouth every 6 (six) hours as needed for severe pain (pain score 7-10)., Disp: 20 tablet, Rfl: 0   polyethylene glycol (MIRALAX) 17 g packet, Take 17 g by mouth daily as needed for mild constipation., Disp: 14 each, Rfl: 0   PRESCRIPTION MEDICATION, Inhale into the lungs See admin instructions. CPAP- At bedtime and during any time of rest, Disp: , Rfl:    Risankizumab-rzaa (SKYRIZI) 180 MG/1.2ML SOCT, Inject (1.2 ml) under the skin every 8 weeks (Patient taking differently: Inject 180 mg into the skin every 8 (eight) weeks.), Disp: 1.2 mL, Rfl: 6   sertraline (ZOLOFT) 50 MG tablet, Take 50 mg by mouth in the morning., Disp: , Rfl:    traZODone (DESYREL) 50 MG tablet, TAKE 1 TABLET BY MOUTH EVERYDAY AT BEDTIME (Patient taking differently: Take 50 mg by mouth at bedtime.), Disp: 90 tablet, Rfl: 3   Vital signs in last 24 hrs: There were no vitals filed for this visit. Wt Readings from Last 3 Encounters:  03/06/23 160 lb (72.6 kg)  02/05/23 161 lb 3.2 oz (73.1 kg)  02/03/23 160 lb 12.8 oz (72.9 kg)    Physical Exam  No exam-telemedicine visit  Labs:   ___________________________________________ Radiologic studies: Narrative & Impression  CLINICAL DATA:  Crohn's disease with acute exacerbation. Left abdominal pain, nausea/vomiting/diarrhea.   EXAM: CT ABDOMEN AND PELVIS WITH CONTRAST   TECHNIQUE: Multidetector CT imaging of the abdomen and pelvis was performed using the standard protocol following bolus administration of intravenous contrast.   RADIATION DOSE REDUCTION: This exam was  performed according to the departmental dose-optimization program which includes automated exposure control, adjustment of the mA and/or kV according to patient size and/or use of iterative reconstruction technique.   CONTRAST:  OMNIPAQUE IOHEXOL 300 MG/ML  SOLN   COMPARISON:  09/08/2022   FINDINGS: Lower chest: No acute abnormality.   Hepatobiliary: Mild focal fatty hepatic infiltration adjacent the falciform ligament. Mild background diffuse hepatic steatosis. No focal enhancing intrahepatic mass. No intra or extrahepatic biliary ductal dilation. Gallbladder unremarkable.   Pancreas: Unremarkable   Spleen: Stable borderline splenomegaly with the spleen measuring 13.7 cm in greatest  dimension. No intrasplenic lesion identified. Splenic vein is patent.   Adrenals/Urinary Tract: Adrenal glands are unremarkable. Kidneys are normal, without renal calculi, focal lesion, or hydronephrosis. Bladder is unremarkable.   Stomach/Bowel: The stomach, small bowel, and large bowel are unremarkable. No evidence of obstruction or focal inflammation. The appendix is normal. No free intraperitoneal gas or fluid.   Vascular/Lymphatic: No significant vascular findings are present. No enlarged abdominal or pelvic lymph nodes.   Reproductive: Uterus and bilateral adnexa are unremarkable.   Other: No abdominal wall hernia or abnormality. No abdominopelvic ascites.   Musculoskeletal: No acute or significant osseous findings.   IMPRESSION: 1. No acute intra-abdominal pathology identified. No definite radiographic explanation for the patient's reported symptoms. 2. Mild hepatic steatosis. 3. Stable borderline splenomegaly.     Electronically Signed   By: Helyn Numbers M.D.   On: 01/14/2023 19:25    ____________________________________________ Other:   _____________________________________________ Assessment & Plan  Assessment: Encounter Diagnoses  Name Primary?   Crohn's  disease of both small and large intestine with rectal bleeding (HCC) Yes   LLQ abdominal pain    Rectal bleeding    Chronic constipation    Long-term use of immunosuppressant medication    She is back to her usual self after getting over this episode of pain with constipation and bleeding. Skyrizi still under good control on recent inpatient sigmoidoscopy, so we will continue with the current dose and interval.  Continue Amitiza, can take an additional dose in a day if she needs.  If that happens and she will run short on for the month, she will contact us and we can see if we have any samples.  Again, reassured her and is okay to take MiraLAX when needed.   Plan: I should be able to see her in 4 to 6 months or sooner if some issue arises.    [name]

## 2023-03-06 NOTE — Telephone Encounter (Signed)
The patient has sent a copy of her new insurance cards today through a "patient message." Will Skyrizi require a prior authorization for the OBI?

## 2023-03-12 ENCOUNTER — Other Ambulatory Visit (HOSPITAL_COMMUNITY): Payer: Self-pay

## 2023-03-12 ENCOUNTER — Telehealth: Payer: Self-pay | Admitting: Pharmacy Technician

## 2023-03-12 ENCOUNTER — Encounter: Payer: Self-pay | Admitting: Gastroenterology

## 2023-03-12 NOTE — Telephone Encounter (Signed)
Pharmacy Patient Advocate Encounter   Received notification from Pt Calls Messages that prior authorization for SKYRIZI 180MG  is required/requested.   Insurance verification completed.   The patient is insured through [employer] .   Per test claim: PA required; PA submitted to above mentioned insurance via CoverMyMeds Key/confirmation #/EOC [id number] Status is pending

## 2023-03-12 NOTE — Telephone Encounter (Signed)
PA has been submitted, and telephone encounter has been created. Please see telephone encounter dated 1.22.25.

## 2023-03-14 ENCOUNTER — Other Ambulatory Visit (HOSPITAL_COMMUNITY): Payer: Self-pay

## 2023-03-14 NOTE — Telephone Encounter (Signed)
Pharmacy Patient Advocate Encounter  Received notification from [facility] that Prior Authorization for [facility] 180MG  has been APPROVED from 1.22.25 to 1.22.26. Ran test claim, Copay is [id number]. This test claim was processed through [facility]- copay amounts may vary at other pharmacies due to pharmacy/plan contracts, or as the patient moves through the different stages of their insurance plan.   PA #/Case ID/Reference #: [id number]  COMPLETE PRO BENEFIT CHECK IS PENDING TO SEE IF PATIENT IS ELIGIBLE FOR COPAY CARD

## 2023-03-15 ENCOUNTER — Other Ambulatory Visit (HOSPITAL_COMMUNITY): Payer: Self-pay

## 2023-03-18 MED ORDER — SKYRIZI 180 MG/1.2ML ~~LOC~~ SOCT: 180.0000 mg | SUBCUTANEOUS | 5 refills | Status: DC

## 2023-03-18 NOTE — Telephone Encounter (Signed)
Monchell, RX has been sent to [facility]. Please release order. Thanks

## 2023-03-24 ENCOUNTER — Encounter: Payer: Self-pay | Admitting: Gastroenterology

## 2023-03-25 NOTE — Telephone Encounter (Signed)
 Very sorry to hear that. 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 Would not recommend changing treatment since it is working so well and she failed multiple other meds before this. Regular attention with primary care, chest Xray when they deem necessary.  Wear  a mask and good hand hygiene.  Not sure if it will be revealing, but a visit with an pulmonologist may help to see if any asthma or other chronic lung condition predisposing to infections.  Allergist might also be able to determine if she has sufficiently responded to all immunizations.  I could recommend some people if she wants.  Respiratory infection season will hopefully settle down by the end of March.  Hope that helps.  - H. Danis

## 2023-04-12 ENCOUNTER — Encounter: Payer: Self-pay | Admitting: Gastroenterology

## 2023-04-14 ENCOUNTER — Other Ambulatory Visit (INDEPENDENT_AMBULATORY_CARE_PROVIDER_SITE_OTHER): Payer: No Typology Code available for payment source

## 2023-04-14 ENCOUNTER — Encounter: Payer: Self-pay | Admitting: Internal Medicine

## 2023-04-14 ENCOUNTER — Encounter: Payer: Self-pay | Admitting: Gastroenterology

## 2023-04-14 ENCOUNTER — Ambulatory Visit (INDEPENDENT_AMBULATORY_CARE_PROVIDER_SITE_OTHER)
Admission: RE | Admit: 2023-04-14 | Discharge: 2023-04-14 | Disposition: A | Discharge status: Home / Self Care | Payer: No Typology Code available for payment source | Source: Ambulatory Visit | Attending: Gastroenterology | Admitting: Gastroenterology

## 2023-04-14 ENCOUNTER — Ambulatory Visit (INDEPENDENT_AMBULATORY_CARE_PROVIDER_SITE_OTHER): Payer: No Typology Code available for payment source | Admitting: Gastroenterology

## 2023-04-14 VITALS — BP 106/64 | HR 77 | Ht 61.0 in | Wt 161.2 lb

## 2023-04-14 DIAGNOSIS — R1030 Lower abdominal pain, unspecified: Secondary | ICD-10-CM

## 2023-04-14 DIAGNOSIS — K921 Melena: Secondary | ICD-10-CM

## 2023-04-14 DIAGNOSIS — K5904 Chronic idiopathic constipation: Secondary | ICD-10-CM | POA: Diagnosis not present

## 2023-04-14 DIAGNOSIS — Z7960 Long term (current) use of unspecified immunomodulators and immunosuppressants: Secondary | ICD-10-CM

## 2023-04-14 DIAGNOSIS — K50811 Crohn's disease of both small and large intestine with rectal bleeding: Secondary | ICD-10-CM

## 2023-04-14 DIAGNOSIS — K5909 Other constipation: Secondary | ICD-10-CM

## 2023-04-14 DIAGNOSIS — R1032 Left lower quadrant pain: Secondary | ICD-10-CM

## 2023-04-14 LAB — CBC WITH DIFFERENTIAL/PLATELET
Basophils Absolute: 0.1 10*3/uL (ref 0.0–0.1)
Basophils Relative: 0.9 % (ref 0.0–3.0)
Eosinophils Absolute: 0.2 10*3/uL (ref 0.0–0.7)
Eosinophils Relative: 2.1 % (ref 0.0–5.0)
HCT: 42.9 % (ref 36.0–46.0)
Hemoglobin: 14.2 g/dL (ref 12.0–15.0)
Lymphocytes Relative: 33.7 % (ref 12.0–46.0)
Lymphs Abs: 2.8 10*3/uL (ref 0.7–4.0)
MCHC: 33.2 g/dL (ref 30.0–36.0)
MCV: 82.5 fl (ref 78.0–100.0)
Monocytes Absolute: 0.5 10*3/uL (ref 0.1–1.0)
Monocytes Relative: 6.3 % (ref 3.0–12.0)
Neutro Abs: 4.6 10*3/uL (ref 1.4–7.7)
Neutrophils Relative %: 57 % (ref 43.0–77.0)
Platelets: 394 10*3/uL (ref 150.0–400.0)
RBC: 5.2 Mil/uL — ABNORMAL HIGH (ref 3.87–5.11)
RDW: 18.1 % — ABNORMAL HIGH (ref 11.5–15.5)
WBC: 8.2 10*3/uL (ref 4.0–10.5)

## 2023-04-14 LAB — COMPREHENSIVE METABOLIC PANEL
ALT: 8 U/L (ref 0–35)
AST: 11 U/L (ref 0–37)
Albumin: 4.7 g/dL (ref 3.5–5.2)
Alkaline Phosphatase: 69 U/L (ref 39–117)
BUN: 10 mg/dL (ref 6–23)
CO2: 26 meq/L (ref 19–32)
Calcium: 9.5 mg/dL (ref 8.4–10.5)
Chloride: 101 meq/L (ref 96–112)
Creatinine, Ser: 0.51 mg/dL (ref 0.40–1.20)
GFR: 127.23 mL/min (ref >60.00)
Glucose, Bld: 88 mg/dL (ref 70–99)
Potassium: 3.9 meq/L (ref 3.5–5.1)
Sodium: 137 meq/L (ref 135–145)
Total Bilirubin: 0.4 mg/dL (ref 0.2–1.2)
Total Protein: 7.7 g/dL (ref 6.0–8.3)

## 2023-04-14 LAB — SEDIMENTATION RATE: Sed Rate: 23 mm/h — ABNORMAL HIGH (ref 0–20)

## 2023-04-14 LAB — C-REACTIVE PROTEIN: CRP: <1 mg/dL (ref 0.5–20.0)

## 2023-04-14 NOTE — Patient Instructions (Addendum)
 Your provider has requested that you have an abdominal x ray before leaving today. Please go to the basement floor to our Radiology department for the test.  Your provider has requested that you go to the basement level for lab work before leaving today. Press "B" on the elevator. The lab is located at the first door on the left as you exit the elevator.  _______________________________________________________  If your blood pressure at your visit was 140/90 or greater, please contact your primary care physician to follow up on this.  _______________________________________________________  If you are age 29 or older, your body mass index should be between 23-30. Your Body mass index is 30.47 kg/m. If this is out of the aforementioned range listed, please consider follow up with your Primary Care Provider.  If you are age 11 or younger, your body mass index should be between 19-25. Your Body mass index is 30.47 kg/m. If this is out of the aformentioned range listed, please consider follow up with your Primary Care Provider.   ________________________________________________________  The [facility] providers would like to encourage you to use [facility] to communicate with providers for non-urgent requests or questions.  Due to long hold times on the telephone, sending your provider a message by [facility] may be a faster and more efficient way to get a response.  Please allow 48 business hours for a response.  Please remember that this is for non-urgent requests.  _______________________________________________________  Due to recent changes in healthcare laws, you may see the results of your imaging and laboratory studies on MyChart before your provider has had a chance to review them.  We understand that in some cases there may be results that are confusing or concerning to you. Not all laboratory results come back in the same time frame and the provider may be waiting for multiple results in order  to interpret others.  Please give [place] 48 hours in order for your provider to thoroughly review all the results before contacting the office for clarification of your results.   Thank you for entrusting me with your care and choosing [facility].  Bayley Leanna Sato, PA-C

## 2023-04-14 NOTE — Progress Notes (Signed)
 ____________________________________________________________  Attending physician addendum:  Thank you for sending this case to me. I have reviewed the entire note and agree with the plan.  Thanks very much for working her into clinic today.  Please keep me posted on the lab and imaging results while I am managing the inpatient service this week. If testing does not suggest a Crohn's flare, then she should do a MiraLAX bowel purge.  Further plan TBD  Amada Jupiter, MD  ____________________________________________________________

## 2023-04-14 NOTE — Telephone Encounter (Signed)
 Had trouble like this months ago that seemed initially to be a flare, then briefly hospitalized and turned out to be from constipation. But this sounds different, since she reports regular BMs (though perhaps hard/difficult stool at one point with the need for Amitiza).  She also has a history of C difficile  As you note, I am out of the office all this week while on hospital consult service.  Please push hard for an APP to see her in the next 2 days.  If not, then the following:  CBC, Sed rate, High-Sensitivity CRP Stool for C difficile PCR and GDH antigen as well as a calprotectin.  - H. Danis

## 2023-04-14 NOTE — Progress Notes (Signed)
 Chief Complaint: Rectosigmoid and ileal Crohn's disease Primary GI MD: Dr. Myrtie Neither  HPI: 28 year old female with history of rectosigmoid and ileal Crohn's disease presents for multiple GI symptoms.  GI HISTORY ---years of marked colitis of the rectosigmoid junction, difficult to control on multiple meds, failed anti-TNF, failed Entyvio, finally evolved into more of a Crohn's-like picture including mild terminal ileitis.  Started on [place] and had complete symptomatic and mucosal remission on last colonoscopy January 2024.  Was doing well at clinic follow-up April 2024. --History of anal fissure and C. difficile infection --May 2024 office visit for acute symptoms of probable gastrointestinal infection,, tested negative for C. difficile. 15 mm cecal SSP removed March 2021.  Diminutive transverse colon tubular adenoma September 2022.  No polyps on January 2024 diagnostic colonoscopy, recall for polyp surveillance at January 2027  --Chronic constipation requiring prescription medication, previously normal anorectal manometry. -- 12/24/2022: Seen for apparent flare of symptoms with lower abdominal/pelvic pain and bleeding.  Negative stool studies at this time.  Prednisone taper started. --- 01/14/2023: Seen again for abdominal/pelvic pain with hematochezia with negative C. difficile testing and treatment with prednisone.  CT without acute changes.  Concern for Crohn's flare led to brief hospitalization in which she underwent flexible sigmoidoscopy with Dr. [name] which showed poor prep/retained stool.  No active Crohn's inflammation in rectosigmoid area no active bleeding.  Thought to be constipation and her Cristy Folks was continued ---- 03/06/2023: video visit with Dr. Myrtie Neither and reportedly doing well on Skyrizi and Amitiza.   ----------------------------TODAY------------------------  Reviewed multiple messages between patient and Dr. Myrtie Neither.  Appears patient felt a viral respiratory illness twice in  January and she also had 2 family members passed in January which is increased her stress.  She is seen today for recurrent lower abdominal/pelvic pain and hematochezia  She has been experiencing a recurrence of lower abdominal pain and rectal bleeding. The bleeding began on March 29, 2023, and she started noticing it after a few days. The abdominal pain began on April 06, 2023. She experiences intermittent bleeding and pain with every bowel movement, which occurs about one to three times a day. She has nausea but no vomiting. No recent straining with bowel movements, which is unusual given her history of chronic constipation.  Her medication regimen includes Amitiza, which she typically takes twice a day, sometimes three times, along with Miralax. Since the bleeding started feb 8th, she has only taken Amitiza on two occasions and has not used Miralax, as she was having daily bowel movements. Her last dose of Cristy Folks  was delayed due to an insurance change, and she took it on April 02, 2023, instead of the scheduled March 31, 2023.   She has been under increased stress due to family issues, including the death of her cousin in a car accident and her grandmother's passing a week later. Additionally, she was sick twice last month with an illness that was prevalent in the community. She has used pain medication to help with her abdominal pain.     Past Medical History:  Diagnosis Date   Allergy    cats   Anemia    Anxiety    panic attacks   Asthma    as a child - dirty cats will cause an asthma attack   Crohn's disease (HCC)    Depression    GERD (gastroesophageal reflux disease)    Hemorrhoids    History of asthma    childhood   History of kidney stones  IBS (irritable bowel syndrome)    Pilonidal cyst    Sleep apnea    Wears glasses     Past Surgical History:  Procedure Laterality Date   ANAL RECTAL MANOMETRY N/A 07/24/2020   Procedure: ANO RECTAL MANOMETRY;  Surgeon:  Sherrilyn Rist, MD;  Location: [facility];  Service: Gastroenterology;  Laterality: N/A;   BIOPSY  01/15/2023   Procedure: BIOPSY;  Surgeon: Imogene Burn, MD;  Location: [facility];  Service: Gastroenterology;;   COLONOSCOPY     COLONOSCOPY WITH ESOPHAGOGASTRODUODENOSCOPY (EGD)  x2  last one 2015   FLEXIBLE SIGMOIDOSCOPY N/A 01/15/2023   Procedure: FLEXIBLE SIGMOIDOSCOPY;  Surgeon: Imogene Burn, MD;  Location: [facility];  Service: Gastroenterology;  Laterality: N/A;   KNEE ARTHROSCOPY Left 03/30/2020   LIPOMA EXCISION N/A 08/28/2022   Procedure: EXCISION OF SUBCUTANEOUS CYST UPPER BACK;  Surgeon: Berna Bue, MD;  Location: [facility];  Service: General;  Laterality: N/A;   PILONIDAL CYST EXCISION N/A 07/18/2017   Procedure: CYST EXCISION PILONIDAL;  Surgeon: Berna Bue, MD;  Location: [facility];  Service: General;  Laterality: N/A;   UPPER GASTROINTESTINAL ENDOSCOPY     WISDOM TOOTH EXTRACTION  2015    Current Outpatient Medications  Medication Sig Dispense Refill   albuterol (PROVENTIL) (2.5 MG/3ML) 0.083% nebulizer solution Take 2.5 mg by nebulization every 6 (six) hours as needed for wheezing or shortness of breath.     albuterol (VENTOLIN HFA) 108 (90 Base) MCG/ACT inhaler Inhale 1-2 puffs into the lungs every 6 (six) hours as needed for wheezing or shortness of breath.     Azelastine-Fluticasone 137-50 MCG/ACT SUSP Place 1 spray into the nose daily. (Patient taking differently: Place 1 spray into both nostrils daily.) 23 g 11   busPIRone (BUSPAR) 15 MG tablet Take 7.5 mg by mouth 2 (two) times daily.     cetirizine (ZYRTEC) 10 MG tablet Take 10 mg by mouth daily.     clindamycin (CLEOCIN T) 1 % SWAB Apply 1 Application topically See admin instructions. Apply to the back once a day     clobetasol cream (TEMOVATE) 0.05 % Apply 1 Application topically 2 (two) times daily as needed (irritation- affected areas).     diclofenac Sodium (VOLTAREN) 1 %  GEL Apply 2-3 g topically 4 (four) times daily as needed (pain).     dicyclomine (BENTYL) 10 MG capsule TAKE 1 CAPSULE 2-3 TIMES A DAY AS NEEDED FOR ABDOMINAL CRAMPS (Patient taking differently: Take 10 mg by mouth See admin instructions. TAKE 10 MG BY MOUTH 2-3 TIMES A DAY AS NEEDED FOR ABDOMINAL CRAMPS) 60 capsule 1   fluticasone-salmeterol (WIXELA INHUB) 250-50 MCG/ACT AEPB Inhale 1 puff into the lungs in the morning and at bedtime.     hydrocortisone cream 1 % Apply to affected area 2 times daily (Patient taking differently: Apply 1 Application topically 2 (two) times daily as needed for itching.) 15 g 0   hydrOXYzine (ATARAX) 25 MG tablet Take 25 mg by mouth daily as needed for anxiety (or panic attacks).     lubiprostone (AMITIZA) 8 MCG capsule TAKE 1 CAPSULE (8 MCG TOTAL) BY MOUTH 2 (TWO) TIMES DAILY WITH A MEAL. 180 capsule 0   montelukast (SINGULAIR) 10 MG tablet Take 1 tablet (10 mg total) by mouth at bedtime. 30 tablet 3   mupirocin ointment (BACTROBAN) 2 % Apply 1 Application topically 2 (two) times daily as needed (for irritation).     omeprazole (PRILOSEC) 40 MG capsule  Take 1 capsule (40 mg total) by mouth daily. (Patient taking differently: Take 40 mg by mouth daily before breakfast.) 90 capsule 2   ondansetron (ZOFRAN) 4 MG tablet TAKE 1 TABLET BY MOUTH EVERY 8 HOURS AS NEEDED FOR NAUSEA AND VOMITING (Patient taking differently: Take 8 mg by mouth every 8 (eight) hours as needed for nausea or vomiting.) 60 tablet 1   oxyCODONE-acetaminophen (PERCOCET) 5-325 MG tablet Take 1-2 tablets by mouth every 6 (six) hours as needed for severe pain (pain score 7-10). 20 tablet 0   polyethylene glycol (MIRALAX) 17 g packet Take 17 g by mouth daily as needed for mild constipation. 14 each 0   PRESCRIPTION MEDICATION Inhale into the lungs See admin instructions. CPAP- At bedtime and during any time of rest     Risankizumab-rzaa (SKYRIZI) 180 MG/1.2ML SOCT Inject 180 mg into the skin every 8 (eight)  weeks. 1.2 mL 5   sertraline (ZOLOFT) 50 MG tablet Take 50 mg by mouth in the morning.     traZODone (DESYREL) 50 MG tablet TAKE 1 TABLET BY MOUTH EVERYDAY AT BEDTIME (Patient taking differently: Take 50 mg by mouth at bedtime.) 90 tablet 3   No current facility-administered medications for this visit.    Allergies as of 04/14/2023 - Review Complete 04/14/2023  Allergen Reaction Noted   Wound dressing adhesive Other (See Comments) 01/14/2023   Bactrim [sulfamethoxazole-trimethoprim] Nausea And Vomiting 01/15/2017   Bismuth subsalicylate Nausea And Vomiting 08/21/2022   Bismuth-containing compounds Nausea And Vomiting 06/15/2019   Cefpodoxime Nausea And Vomiting and Other (See Comments) 01/15/2017   Doxycycline Nausea And Vomiting 01/15/2017   Ibuprofen Other (See Comments) 01/14/2023   Silicone Other (See Comments) 09/18/2022   Vancomycin Itching and Other (See Comments) 07/18/2017    Family History  Problem Relation Age of Onset   Asthma Mother    Irritable bowel syndrome Mother    Endometriosis Mother    Colon cancer Mother    COPD Mother    Hypertension Father    Colon polyps Father    Endometriosis Sister    Irritable bowel syndrome Sister    Diverticulitis Sister    Heart attack Maternal Grandmother    Breast cancer Paternal Grandfather    Colon cancer Maternal Uncle    Liver cancer Neg Hx    Rectal cancer Neg Hx    Pancreatic cancer Neg Hx    Stomach cancer Neg Hx    Esophageal cancer Neg Hx     Social History   Socioeconomic History   Marital status: Single    Spouse name: Not on file   Number of children: Not on file   Years of education: Not on file   Highest education level: Not on file  Occupational History   Not on file  Tobacco Use   Smoking status: Former    Current packs/day: 0.00    Average packs/day: 0.5 packs/day for 5.0 years (2.5 ttl pk-yrs)    Types: Cigarettes, E-cigarettes    Start date: 05/15/2012    Quit date: 05/15/2017    Years since  quitting: 5.9   Smokeless tobacco: Never   Tobacco comments:    07-15-2017  per pt quit smoking cig. 05-15-2017 but occasionally vapes  Vaping Use   Vaping status: Some Days  Substance and Sexual Activity   Alcohol use: Not Currently    Comment: rarely   Drug use: No   Sexual activity: Yes    Birth control/protection: None    Comment: Last menstral  cycle approximately 2 weeks ago "Late August".  Pt denies being pregnant.  Other Topics Concern   Not on file  Social History Narrative   Not on file   Social Drivers of Health   Financial Resource Strain: Low Risk  (11/06/2020)   Received from [facility], [facility]   Overall Financial Resource Strain (CARDIA)    Difficulty of Paying Living Expenses: Not very hard  Food Insecurity: No Food Insecurity (01/14/2023)   Hunger Vital Sign    Worried About Running Out of Food in the Last Year: Never true    Ran Out of Food in the Last Year: Never true  Transportation Needs: No Transportation Needs (01/14/2023)   PRAPARE - Administrator, Civil Service (Medical): No    Lack of Transportation (Non-Medical): No  Physical Activity: Sufficiently Active (11/06/2020)   Received from [facility], [facility]   Exercise Vital Sign    Days of Exercise per Week: 5 days    Minutes of Exercise per Session: 60 min  Stress: No Stress Concern Present (11/06/2020)   Received from [facility], [facility] - Occupational Stress Questionnaire    Feeling of Stress : Only a little  Social Connections: Unknown (06/17/2021)   Received from [facility], [facility]   Social Network    Social Network: Not on file  Intimate Partner Violence: Not At Risk (01/14/2023)   Humiliation, Afraid, Rape, and Kick questionnaire    Fear of Current or Ex-Partner: No    Emotionally Abused: No    Physically Abused: No    Sexually Abused: No    Review of Systems:    Constitutional: No weight loss,  fever, chills, weakness or fatigue HEENT: Eyes: No change in vision               Ears, Nose, Throat:  No change in hearing or congestion Skin: No rash or itching Cardiovascular: No chest pain, chest pressure or palpitations   Respiratory: No SOB or cough Gastrointestinal: See HPI and otherwise negative Genitourinary: No dysuria or change in urinary frequency Neurological: No headache, dizziness or syncope Musculoskeletal: No new muscle or joint pain Hematologic: No bleeding or bruising Psychiatric: No history of depression or anxiety    Physical Exam:  Vital signs: BP 106/64   Pulse 77   Ht 5\' 1"  (1.549 m)   Wt 161 lb 4 oz (73.1 kg)   BMI 30.47 kg/m   Constitutional: NAD, Well developed, Well nourished, alert and cooperative Head:  Normocephalic and atraumatic. Eyes:   PEERL, EOMI. No icterus. Conjunctiva pink. Respiratory: Respirations even and unlabored. Lungs clear to auscultation bilaterally.   No wheezes, crackles, or rhonchi.  Cardiovascular:  Regular rate and rhythm. No peripheral edema, cyanosis or pallor.  Gastrointestinal:  Soft, nondistended, nontender. No rebound or guarding. hypoactive bowel sounds. No appreciable masses or hepatomegaly. Rectal:  Not performed.  Msk:  Symmetrical without gross deformities. Without edema, no deformity or joint abnormality.  Neurologic:  Alert and  oriented x4;  grossly normal neurologically.  Skin:   Dry and intact without significant lesions or rashes. Psychiatric: Oriented to person, place and time. Demonstrates good judgement and reason without abnormal affect or behaviors.    RELEVANT LABS AND IMAGING: CBC    Component Value Date/Time   WBC 11.0 (H) 01/15/2023 0454   RBC 4.58 01/15/2023 0454   HGB 10.7 (L) 01/15/2023 0454   HCT 36.2 01/15/2023 0454   PLT  423 (H) 01/15/2023 0454   MCV 79.0 (L) 01/15/2023 0454   MCH 23.4 (L) 01/15/2023 0454   MCHC 29.6 (L) 01/15/2023 0454   RDW 18.2 (H) 01/15/2023 0454   LYMPHSABS 5.0  (H) 01/15/2023 0454   MONOABS 0.8 01/15/2023 0454   EOSABS 0.1 01/15/2023 0454   BASOSABS 0.1 01/15/2023 0454    CMP     Component Value Date/Time   NA 141 01/15/2023 0454   K 3.7 01/15/2023 0454   CL 106 01/15/2023 0454   CO2 25 01/15/2023 0454   GLUCOSE 79 01/15/2023 0454   BUN 7 01/15/2023 0454   CREATININE 0.62 01/15/2023 0454   CALCIUM 9.0 01/15/2023 0454   PROT 7.3 01/14/2023 1346   ALBUMIN 4.0 01/14/2023 1346   AST 14 (L) 01/14/2023 1346   ALT 11 01/14/2023 1346   ALKPHOS 68 01/14/2023 1346   BILITOT 0.6 01/14/2023 1346   GFRNONAA >60 01/15/2023 0454     Assessment/Plan:   Crohn's disease of both small and large intestine with rectal bleeding LLQ abdominal pain Rectal bleeding Chronic constipation Long-term use of immunosuppressive medication Lower abdominal/pelvic pain and hematochezia reminiscent of hospitalization November 2024 in which it was a flare of her constipation not so much a flare of her IBD.  Additionally, hypoactive bowel sounds were noted on exam. Nonetheless with multiple recent viral illnesses we need to rule out infection and check for inflammation to rule out IBD flare. Not taking amitiza and using pain medication has likely worsened symptoms. - CBC, CMP, sed rate, CRP - Stool study for C. difficile PCR and GDH antigen - Fecal calprotectin - Abdominal x-ray for evaluation of constipation - Management per above results.  If x-ray shows constipation would recommend continuing Amitiza and adding on MiraLAX.  If x-ray is negative for constipation and studies show possible flare can initiate prednisone taper.  Additionally if stool studies show infection can treat accordingly.   Lara Mulch [facility] 04/14/2023, 2:33 PM  Cc: Stamey, Verda Cumins, FNP

## 2023-04-15 ENCOUNTER — Other Ambulatory Visit: Payer: No Typology Code available for payment source

## 2023-04-15 DIAGNOSIS — K50811 Crohn's disease of both small and large intestine with rectal bleeding: Secondary | ICD-10-CM

## 2023-04-15 DIAGNOSIS — R1030 Lower abdominal pain, unspecified: Secondary | ICD-10-CM

## 2023-04-15 DIAGNOSIS — K921 Melena: Secondary | ICD-10-CM

## 2023-04-15 DIAGNOSIS — K5904 Chronic idiopathic constipation: Secondary | ICD-10-CM

## 2023-04-16 LAB — C. DIFFICILE GDH AND TOXIN A/B
GDH ANTIGEN: NOT DETECTED
MICRO NUMBER:: 16126136
SPECIMEN QUALITY:: ADEQUATE
TOXIN A AND B: NOT DETECTED

## 2023-04-17 LAB — CALPROTECTIN, FECAL: Calprotectin, Fecal: 111 ug/g (ref 0–120)

## 2023-04-29 ENCOUNTER — Other Ambulatory Visit: Payer: Self-pay | Admitting: Internal Medicine

## 2023-04-29 ENCOUNTER — Other Ambulatory Visit: Payer: Self-pay | Admitting: Gastroenterology

## 2023-05-01 ENCOUNTER — Ambulatory Visit: Admitting: Urgent Care

## 2023-05-01 ENCOUNTER — Encounter: Payer: Self-pay | Admitting: Urgent Care

## 2023-05-01 ENCOUNTER — Other Ambulatory Visit: Payer: Self-pay | Admitting: Urgent Care

## 2023-05-01 ENCOUNTER — Telehealth: Payer: Self-pay

## 2023-05-01 VITALS — BP 119/81 | HR 86 | Ht 61.0 in | Wt 163.0 lb

## 2023-05-01 DIAGNOSIS — K50811 Crohn's disease of both small and large intestine with rectal bleeding: Secondary | ICD-10-CM

## 2023-05-01 DIAGNOSIS — L700 Acne vulgaris: Secondary | ICD-10-CM

## 2023-05-01 DIAGNOSIS — J4540 Moderate persistent asthma, uncomplicated: Secondary | ICD-10-CM

## 2023-05-01 DIAGNOSIS — F32A Depression, unspecified: Secondary | ICD-10-CM

## 2023-05-01 DIAGNOSIS — G4733 Obstructive sleep apnea (adult) (pediatric): Secondary | ICD-10-CM

## 2023-05-01 DIAGNOSIS — F419 Anxiety disorder, unspecified: Secondary | ICD-10-CM

## 2023-05-01 MED ORDER — CLINDAMYCIN PHOS-BENZOYL PEROX 1.2-2.5 % EX GEL: 1.0000 | Freq: Two times a day (BID) | CUTANEOUS | 0 refills | Status: DC

## 2023-05-01 MED ORDER — BUSPIRONE HCL 10 MG PO TABS: 10.0000 mg | ORAL_TABLET | Freq: Two times a day (BID) | ORAL | 1 refills | Status: DC

## 2023-05-01 MED ORDER — SPIRONOLACTONE 25 MG PO TABS: 25.0000 mg | ORAL_TABLET | Freq: Every day | ORAL | 3 refills | Status: DC

## 2023-05-01 MED ORDER — AIRSUPRA 90-80 MCG/ACT IN AERO: 2.0000 | INHALATION_SPRAY | Freq: Four times a day (QID) | RESPIRATORY_TRACT | 11 refills | Status: AC | PRN

## 2023-05-01 MED ORDER — TRELEGY ELLIPTA 100-62.5-25 MCG/ACT IN AEPB: 1.0000 | INHALATION_SPRAY | Freq: Every day | RESPIRATORY_TRACT | 11 refills | Status: DC

## 2023-05-01 NOTE — Patient Instructions (Signed)
 Stop your current buspirone dose and start the one called in today. Take 10mg  twice daily.  For your asthma, switch to Trelegy Ellipta and take one puff daily. Rinse mouth out after use to prevent thrush. If covered by your insurance, use Airsupra in place of your current albuterol pump. Take up to 12 puffs in a 24 hour period. Rinse mouth out after each use.  For your back, start taking 12.5mg  (1/2 tab) of spironolactone daily. If within 1-2 weeks you do not see any improvement in your skin, then increase to 25mg  (1 full tab). Monitor for any decrease in Blood pressure while on this medication.   Return for a recheck in 6 weeks.

## 2023-05-01 NOTE — Telephone Encounter (Signed)
 Pharmacy fax received stating Trelegy Ellipta is not covered. An alternative was provided, [employer] inhaler.

## 2023-05-01 NOTE — Progress Notes (Signed)
 New Patient Office Visit  Subjective:  Patient ID: Marissa Morales, female    DOB: [birth year]  Age: 28 y.o. MRN: [id number]  CC:  Chief Complaint  Patient presents with   Establish Care    New pt est care. She has been sick for 2 weeks and wants to make sure its not turning into pneumonia due to chest congestion. She would also like to discuss weight lost meds    HPI Marissa Morales presents to establish care.  The patient presents with persistent cough and congestion.  This is her third respiratory illness this year. The first episode in January lasted four days with fever, congestion, sinus pressure, and body aches, but she did not seek medical attention. The second episode occurred two weeks later, with similar but more severe symptoms, leading to a doctor's visit where she tested negative for strep, flu, and COVID. She received a steroid shot and Tamiflu, improving after a week and a half. The current episode has lasted two weeks, with persistent cough and congestion, but no body aches.  She has a history of asthma and is on Wixela, a rescue inhaler, and albuterol nebulizer treatments. She did temporarily stop the wixela during her most recent illness as she thought it was contraindicated during illness. She did restart it and is starting to feel some improvement. She last used albuterol on the previous Saturday. She also takes Singulair daily but is unsure of its effectiveness. She uses Dymista nasal spray and Zyrtec for allergies.  She has a history of Crohn's disease, initially diagnosed as ulcerative colitis, with symptoms starting in 2019. She experiences recurrent rectal bleeding during flares and is currently dealing with this issue. She takes Psychologist, prison and probation services, managed by a gastroenterologist, and uses Miralax, dicyclomine, and omeprazole. She had her first colonoscopy at 64, with a second at 8 showing stomach inflammation.  She has obstructive sleep apnea, diagnosed about a year  ago, and uses a CPAP machine. She is scheduled for tonsillectomy next month to address this issue. She aims to lose weight to potentially discontinue CPAP use.  She experiences anxiety and depression, managed with Zoloft and Buspar. She takes Buspar 7.5 mg twice daily and uses hydroxyzine for breakthrough anxiety. She has concerns about taking multiple allergy medications.  She has a history of folliculitis on her legs and arms, using clobetasol cream during episodes. She also uses hydrocortisone cream for rectal issues.   Lastly, she is concerned about cystic acne on her back. Has seen derm in the past and tried "everything" without improvement. Numerous topical and oral medications. Would like to avoid abx due to her GI issues as well as hx of C. Diff on PO abx in the past.   She is currently a CMA, in school hoping to become a Scientist, clinical (histocompatibility and immunogenetics).      Outpatient Encounter Medications as of 05/01/2023  Medication Sig   albuterol (VENTOLIN HFA) 108 (90 Base) MCG/ACT inhaler Inhale 1-2 puffs into the lungs every 6 (six) hours as needed for wheezing or shortness of breath.   Albuterol-Budesonide (AIRSUPRA) 90-80 MCG/ACT AERO Inhale 2 puffs into the lungs every 6 (six) hours as needed.   Azelastine-Fluticasone 137-50 MCG/ACT SUSP Place 1 spray into the nose daily. (Patient taking differently: Place 1 spray into both nostrils daily.)   busPIRone (BUSPAR) 10 MG tablet Take 1 tablet (10 mg total) by mouth 2 (two) times daily.   cetirizine (ZYRTEC) 10 MG tablet Take 10 mg by mouth daily.   Clindamycin Phos-Benzoyl  Perox gel Apply 1 application  topically 2 (two) times daily.   clobetasol cream (TEMOVATE) 0.05 % Apply 1 Application topically 2 (two) times daily as needed (irritation- affected areas).   diclofenac Sodium (VOLTAREN) 1 % GEL Apply 2-3 g topically 4 (four) times daily as needed (pain).   dicyclomine (BENTYL) 10 MG capsule TAKE 1 CAPSULE 2-3 TIMES A DAY AS NEEDED FOR ABDOMINAL CRAMPS (Patient taking  differently: Take 10 mg by mouth See admin instructions. TAKE 10 MG BY MOUTH 2-3 TIMES A DAY AS NEEDED FOR ABDOMINAL CRAMPS)   Fluticasone-Umeclidin-Vilant (TRELEGY ELLIPTA) 100-62.5-25 MCG/ACT AEPB Inhale 1 puff into the lungs daily.   hydrocortisone cream 1 % Apply to affected area 2 times daily (Patient taking differently: Apply 1 Application topically 2 (two) times daily as needed for itching.)   hydrOXYzine (ATARAX) 25 MG tablet Take 25 mg by mouth daily as needed for anxiety (or panic attacks).   lubiprostone (AMITIZA) 8 MCG capsule TAKE 1 CAPSULE (8 MCG TOTAL) BY MOUTH 2 (TWO) TIMES DAILY WITH A MEAL.   montelukast (SINGULAIR) 10 MG tablet TAKE 1 TABLET BY MOUTH EVERYDAY AT BEDTIME   omeprazole (PRILOSEC) 40 MG capsule Take 1 capsule (40 mg total) by mouth daily. (Patient taking differently: Take 40 mg by mouth daily before breakfast.)   oxyCODONE-acetaminophen (PERCOCET) 5-325 MG tablet Take 1-2 tablets by mouth every 6 (six) hours as needed for severe pain (pain score 7-10).   polyethylene glycol (MIRALAX) 17 g packet Take 17 g by mouth daily as needed for mild constipation.   Risankizumab-rzaa (SKYRIZI) 180 MG/1.2ML SOCT Inject 180 mg into the skin every 8 (eight) weeks.   sertraline (ZOLOFT) 50 MG tablet Take 50 mg by mouth in the morning.   spironolactone (ALDACTONE) 25 MG tablet Take 1 tablet (25 mg total) by mouth daily.   traZODone (DESYREL) 50 MG tablet TAKE 1 TABLET BY MOUTH EVERYDAY AT BEDTIME (Patient taking differently: Take 50 mg by mouth at bedtime.)   [DISCONTINUED] busPIRone (BUSPAR) 15 MG tablet Take 7.5 mg by mouth 2 (two) times daily.   [DISCONTINUED] fluticasone-salmeterol (WIXELA INHUB) 250-50 MCG/ACT AEPB Inhale 1 puff into the lungs in the morning and at bedtime.   albuterol (PROVENTIL) (2.5 MG/3ML) 0.083% nebulizer solution Take 2.5 mg by nebulization every 6 (six) hours as needed for wheezing or shortness of breath. (Patient not taking: Reported on 05/01/2023)    mupirocin ointment (BACTROBAN) 2 % Apply 1 Application topically 2 (two) times daily as needed (for irritation).   [DISCONTINUED] clindamycin (CLEOCIN T) 1 % SWAB Apply 1 Application topically See admin instructions. Apply to the back once a day (Patient not taking: Reported on 05/01/2023)   [DISCONTINUED] ondansetron (ZOFRAN) 4 MG tablet TAKE 1 TABLET BY MOUTH EVERY 8 HOURS AS NEEDED FOR NAUSEA AND VOMITING (Patient not taking: Reported on 05/01/2023)   [DISCONTINUED] PRESCRIPTION MEDICATION Inhale into the lungs See admin instructions. CPAP- At bedtime and during any time of rest (Patient not taking: Reported on 05/01/2023)   No facility-administered encounter medications on file as of 05/01/2023.    Past Medical History:  Diagnosis Date   Allergy    cats   Anemia    Anxiety    panic attacks   Asthma    as a child - dirty cats will cause an asthma attack   Crohn's disease (HCC)    Depression    GERD (gastroesophageal reflux disease)    Hemorrhoids    History of asthma    childhood  History of kidney stones    IBS (irritable bowel syndrome)    Pilonidal cyst    Sleep apnea    Wears glasses     Past Surgical History:  Procedure Laterality Date   ANAL RECTAL MANOMETRY N/A 07/24/2020   Procedure: ANO RECTAL MANOMETRY;  Surgeon: Sherrilyn Rist, MD;  Location: [facility];  Service: Gastroenterology;  Laterality: N/A;   BIOPSY  01/15/2023   Procedure: BIOPSY;  Surgeon: Imogene Burn, MD;  Location: [facility];  Service: Gastroenterology;;   COLONOSCOPY     COLONOSCOPY WITH ESOPHAGOGASTRODUODENOSCOPY (EGD)  x2  last one 2015   FLEXIBLE SIGMOIDOSCOPY N/A 01/15/2023   Procedure: FLEXIBLE SIGMOIDOSCOPY;  Surgeon: Imogene Burn, MD;  Location: [facility];  Service: Gastroenterology;  Laterality: N/A;   KNEE ARTHROSCOPY Left 03/30/2020   LIPOMA EXCISION N/A 08/28/2022   Procedure: EXCISION OF SUBCUTANEOUS CYST UPPER BACK;  Surgeon: Berna Bue, MD;  Location: [facility];   Service: General;  Laterality: N/A;   PILONIDAL CYST EXCISION N/A 07/18/2017   Procedure: CYST EXCISION PILONIDAL;  Surgeon: Berna Bue, MD;  Location: [facility];  Service: General;  Laterality: N/A;   UPPER GASTROINTESTINAL ENDOSCOPY     WISDOM TOOTH EXTRACTION  2015    Family History  Problem Relation Age of Onset   Asthma Mother    Irritable bowel syndrome Mother    Endometriosis Mother    Colon cancer Mother    COPD Mother    Hypertension Father    Colon polyps Father    Endometriosis Sister    Irritable bowel syndrome Sister    Diverticulitis Sister    Heart attack Maternal Grandmother    Breast cancer Paternal Grandfather    Colon cancer Maternal Uncle    Liver cancer Neg Hx    Rectal cancer Neg Hx    Pancreatic cancer Neg Hx    Stomach cancer Neg Hx    Esophageal cancer Neg Hx     Social History   Socioeconomic History   Marital status: Single    Spouse name: Not on file   Number of children: Not on file   Years of education: Not on file   Highest education level: Not on file  Occupational History   Not on file  Tobacco Use   Smoking status: Former    Current packs/day: 0.00    Average packs/day: 0.5 packs/day for 5.0 years (2.5 ttl pk-yrs)    Types: Cigarettes, E-cigarettes    Start date: 05/15/2012    Quit date: 05/15/2017    Years since quitting: 5.9   Smokeless tobacco: Never   Tobacco comments:    07-15-2017  per pt quit smoking cig. 05-15-2017 but occasionally vapes  Vaping Use   Vaping status: Some Days  Substance and Sexual Activity   Alcohol use: Not Currently    Comment: rarely   Drug use: No   Sexual activity: Yes    Birth control/protection: None    Comment: Last menstral cycle approximately 2 weeks ago "Late August".  Pt denies being pregnant.  Other Topics Concern   Not on file  Social History Narrative   Not on file   Social Drivers of Health   Financial Resource Strain: Low Risk  (11/06/2020)   Received  from [facility], [facility]   Overall Financial Resource Strain (CARDIA)    Difficulty of Paying Living Expenses: Not very hard  Food Insecurity: No Food Insecurity (01/14/2023)   Hunger Vital Sign  Worried About Programme researcher, broadcasting/film/video in the Last Year: Never true    Ran Out of Food in the Last Year: Never true  Transportation Needs: No Transportation Needs (01/14/2023)   PRAPARE - Administrator, Civil Service (Medical): No    Lack of Transportation (Non-Medical): No  Physical Activity: Sufficiently Active (11/06/2020)   Received from [facility], [facility]   Exercise Vital Sign    Days of Exercise per Week: 5 days    Minutes of Exercise per Session: 60 min  Stress: No Stress Concern Present (11/06/2020)   Received from [facility], [facility] - Occupational Stress Questionnaire    Feeling of Stress : Only a little  Social Connections: Unknown (06/17/2021)   Received from [facility], [facility]   Social Network    Social Network: Not on file  Intimate Partner Violence: Not At Risk (01/14/2023)   Humiliation, Afraid, Rape, and Kick questionnaire    Fear of Current or Ex-Partner: No    Emotionally Abused: No    Physically Abused: No    Sexually Abused: No    ROS: as noted in HPI  Objective:  BP 119/81   Pulse 86   Ht 5\' 1"  (1.549 m)   Wt 163 lb (73.9 kg)   LMP 03/17/2023   SpO2 97%   BMI 30.80 kg/m   Physical Exam Vitals and nursing note reviewed.  Constitutional:      General: She is not in acute distress.    Appearance: Normal appearance. She is not ill-appearing, toxic-appearing or diaphoretic.  HENT:     Head: Normocephalic and atraumatic.     Right Ear: Tympanic membrane, ear canal and external ear normal. There is no impacted cerumen.     Left Ear: Tympanic membrane, ear canal and external ear normal. There is no impacted cerumen.     Nose: Nose normal.     Mouth/Throat:     Mouth:  Mucous membranes are moist.     Pharynx: Oropharynx is clear. No oropharyngeal exudate or posterior oropharyngeal erythema.  Eyes:     General: No scleral icterus.       Right eye: No discharge.        Left eye: No discharge.     Extraocular Movements: Extraocular movements intact.     Pupils: Pupils are equal, round, and reactive to light.  Neck:     Thyroid: No thyroid mass, thyromegaly or thyroid tenderness.  Cardiovascular:     Rate and Rhythm: Normal rate and regular rhythm.     Pulses: Normal pulses.     Heart sounds: No murmur heard. Pulmonary:     Effort: Pulmonary effort is normal. No respiratory distress.     Breath sounds: Normal breath sounds. No stridor. No wheezing or rhonchi.  Musculoskeletal:     Cervical back: Normal range of motion and neck supple. No rigidity or tenderness.     Right lower leg: No edema.     Left lower leg: No edema.  Lymphadenopathy:     Cervical: No cervical adenopathy.  Skin:    General: Skin is warm and dry.     Coloration: Skin is not jaundiced.     Findings: No bruising or erythema.  Neurological:     General: No focal deficit present.     Mental Status: She is alert and oriented to person, place, and time.     Sensory: No sensory deficit.  Motor: No weakness.  Psychiatric:        Mood and Affect: Mood normal.        Behavior: Behavior normal.     Assessment & Plan:  Moderate persistent asthma without complication -     Trelegy Ellipta; Inhale 1 puff into the lungs daily.  Dispense: 1 each; Refill: 11 -     Airsupra; Inhale 2 puffs into the lungs every 6 (six) hours as needed.  Dispense: 1 g; Refill: 11  Crohn's disease of both small and large intestine with rectal bleeding (HCC)  Mild obstructive sleep apnea  Anxiety and depression -     busPIRone HCl; Take 1 tablet (10 mg total) by mouth 2 (two) times daily.  Dispense: 180 tablet; Refill: 1  Acne vulgaris -     Spironolactone; Take 1 tablet (25 mg total) by mouth daily.   Dispense: 90 tablet; Refill: 3 -     Clindamycin Phos-Benzoyl Perox; Apply 1 application  topically 2 (two) times daily.  Dispense: 50 g; Refill: 0   Assessment and Plan    Chronic Cough and Congestion Recurrent respiratory infections with persistent cough and congestion. Asthma and immunocompromised status contribute. Lungs clear, suggesting inflammatory etiology. Discussed continuing Wixela and switching to Trelegy Ellipta. Airsupra considered, insurance coverage uncertain. - Continue Wixela as prescribed. - Prescribe Trelegy Ellipta as an alternative to [employer]. - Attempt to obtain Airsupra with coupon card. - Continue Singulair and as-needed albuterol.  Asthma Asthma management includes Wixela, Singulair, and albuterol. Recent exacerbation possibly due to stopping Wixela. Lungs clear, no acute exacerbation. Discussed Trelegy Ellipta for better control. Airsupra considered, insurance coverage uncertain. - Continue current asthma management with Wixela, Singulair, and albuterol. - Consider switching to Trelegy Ellipta for better control. - Attempt to obtain Airsupra for additional inflammation control.  Crohn's Disease Managed by gastroenterologist with Cristy Folks. Symptoms include recurrent rectal bleeding. Upcoming re-evaluation and potential colonoscopy in April. - Continue current management with Skyrizi and other prescribed medications. - Follow up with gastroenterologist for re-evaluation and potential colonoscopy.  Obstructive Sleep Apnea Using CPAP therapy. Scheduled for tonsillectomy to potentially alleviate symptoms. Discussed potential to discontinue CPAP post-surgery. - Proceed with scheduled tonsillectomy. - Continue CPAP therapy until further evaluation post-surgery.  Anxiety Current management with sertraline and buspirone. Insufficient control with current buspirone dose. Discussed increasing buspirone to 10 mg twice daily. - Increase buspirone to 10 mg twice daily. -  Continue sertraline as prescribed. - Evaluate response to increased buspirone dose in six weeks.  Acne Persistent pustular acne on the back, possibly related to immunodeficiency. Previous treatments with limited success. Discussed starting spironolactone and using high-dose clindamycin and low-dose benzoyl peroxide topical gel. - Prescribe spironolactone starting at 12.5 mg, with option to increase to 25 mg if needed. - Prescribe high-dose clindamycin and low-dose benzoyl peroxide topical gel. - Advise on application techniques and precautions.  General Health Maintenance Actively working on weight loss. Discussed dietary habits and challenges with cravings. Consideration of low-dose naltrexone for carbohydrate cravings. - Incorporate more protein into diet to manage blood sugar levels and reduce cravings. - Consider low-dose naltrexone for carbohydrate cravings if dietary changes are insufficient.  Follow-up Plan to monitor response to medication adjustments and overall health status. - Schedule follow-up appointment in six weeks to assess response to increased buspirone dose and acne treatment. - Use MyChart for any interim concerns, particularly respiratory issues.       Return in about 6 weeks (around 06/12/2023).   Maretta Bees, PA

## 2023-05-01 NOTE — Telephone Encounter (Deleted)
 Upon further review - Marissa Morales is not indicated for the treatment of asthma, only for COPD. This is not an appropriate change.

## 2023-05-01 NOTE — Telephone Encounter (Addendum)
 Okay, thank you. I have called in the [place] in its place.  Upon further review, Marissa Morales is not indicated for the treament of chronic asthma. This is not an appropriate alternative. I will further discuss with patient at her follow up appointment. Thanks

## 2023-05-02 NOTE — Telephone Encounter (Signed)
 Noted.

## 2023-05-14 ENCOUNTER — Ambulatory Visit: Admitting: Urgent Care

## 2023-05-23 ENCOUNTER — Ambulatory Visit: Admitting: Urgent Care

## 2023-05-23 ENCOUNTER — Encounter: Payer: Self-pay | Admitting: Urgent Care

## 2023-05-23 VITALS — BP 121/81 | HR 81 | Temp 98.0°F | Wt 163.0 lb

## 2023-05-23 DIAGNOSIS — J069 Acute upper respiratory infection, unspecified: Secondary | ICD-10-CM | POA: Diagnosis not present

## 2023-05-23 DIAGNOSIS — J4541 Moderate persistent asthma with (acute) exacerbation: Secondary | ICD-10-CM

## 2023-05-23 MED ORDER — AZITHROMYCIN 250 MG PO TABS: ORAL_TABLET | ORAL | 0 refills | Status: AC

## 2023-05-23 MED ORDER — PREDNISONE 50 MG PO TABS: 50.0000 mg | ORAL_TABLET | Freq: Every day | ORAL | 0 refills | Status: DC

## 2023-05-23 MED ORDER — HYDROCODONE BIT-HOMATROP MBR 5-1.5 MG/5ML PO SOLN
5.0000 mL | Freq: Every evening | ORAL | 0 refills | Status: DC | PRN
Start: 2023-05-23 — End: 2023-06-10

## 2023-05-23 MED ORDER — HYDROCODONE BIT-HOMATROP MBR 5-1.5 MG/5ML PO SOLN: 5.0000 mL | Freq: Every evening | ORAL | 0 refills | Status: DC | PRN

## 2023-05-23 MED ORDER — METHYLPREDNISOLONE ACETATE 80 MG/ML IJ SUSP
80.0000 mg | Freq: Once | INTRAMUSCULAR | Status: AC
  Administered 2023-05-23: 80 mg via INTRAMUSCULAR

## 2023-05-23 MED ORDER — ALBUTEROL SULFATE (2.5 MG/3ML) 0.083% IN NEBU: 2.5000 mg | INHALATION_SOLUTION | Freq: Four times a day (QID) | RESPIRATORY_TRACT | 2 refills | Status: AC | PRN

## 2023-05-23 NOTE — Progress Notes (Signed)
 Established Patient Office Visit  Subjective:  Patient ID: Marissa Morales, female    DOB: [birth year]  Age: 28 y.o. MRN: [id number]  Chief Complaint  Patient presents with   Cough    Cough, congestion, fatigue and yellow mucous when she blows her nose for 6 days now. She states her cough is so bad she has been having to use her inhaler more than often.    HPI  Discussed the use of AI scribe software for clinical note transcription with the patient, who gave verbal consent to proceed.  History of Present Illness   Marissa Morales is a 28 year old female with asthma who presents with a six-day history of respiratory symptoms.  She has experienced severe coughing fits for six days, causing 'black spots' in her vision and a sensation of impending syncope. These episodes are relieved by using her Airsupra inhaler every six hours. She feels weak, and her symptoms are affecting her ability to work and attend social events. No fever is present, but she experiences chest and stomach pain from coughing, and her throat is sore and irritated.  She reports bilateral ear pain and nasal congestion with discharge. Despite using azelastine and Flonase nasal spray, promethazine cough syrup, cough drops, and Mucinex, she finds them ineffective. Her symptoms worsen with physical activity.  Her past medical history includes asthma, for which she uses Airsupra, Wixela, and Singulair. She has a nebulizer at home but is unsure of the current status of the solution. She is not on daily prednisone. She is on immunomodulating medications.  She also takes omeprazole and has a prescription for Percocet for gastrointestinal issues, which she uses sparingly.      Patient Active Problem List   Diagnosis Date Noted   Crohn's disease (HCC) 01/14/2023   Anemia due to blood loss 12/18/2022   Mild obstructive sleep apnea 04/24/2022   Obesity (BMI 30.0-34.9) 04/24/2022   Crohn's disease of both small and large intestine  with rectal bleeding (HCC) 10/23/2021   Chronic idiopathic constipation    Anxiety and depression 06/17/2020   Asthma 06/17/2020   Ulcerative colitis (HCC) 06/16/2020   Rectal bleeding 11/25/2019   Emesis 03/16/2013   Nausea 03/16/2013   Constipated 12/15/2012   Dysphagia 09/28/2012   Diarrhea 09/28/2012   Weight loss 09/28/2012   Hematochezia 09/28/2012   History of rectal bleeding 05/29/2011   Abdominal pain 03/01/2011   Past Medical History:  Diagnosis Date   Allergy    cats   Anemia    Anxiety    panic attacks   Asthma    as a child - dirty cats will cause an asthma attack   Crohn's disease (HCC)    Depression    GERD (gastroesophageal reflux disease)    Hemorrhoids    History of asthma    childhood   History of kidney stones    IBS (irritable bowel syndrome)    Pilonidal cyst    Sleep apnea    Wears glasses    Past Surgical History:  Procedure Laterality Date   ANAL RECTAL MANOMETRY N/A 07/24/2020   Procedure: ANO RECTAL MANOMETRY;  Surgeon: Sherrilyn Rist, MD;  Location: [facility];  Service: Gastroenterology;  Laterality: N/A;   BIOPSY  01/15/2023   Procedure: BIOPSY;  Surgeon: Imogene Burn, MD;  Location: [facility];  Service: Gastroenterology;;   COLONOSCOPY     COLONOSCOPY WITH ESOPHAGOGASTRODUODENOSCOPY (EGD)  x2  last one 2015   FLEXIBLE SIGMOIDOSCOPY N/A 01/15/2023  Procedure: FLEXIBLE SIGMOIDOSCOPY;  Surgeon: Imogene Burn, MD;  Location: [facility];  Service: Gastroenterology;  Laterality: N/A;   KNEE ARTHROSCOPY Left 03/30/2020   LIPOMA EXCISION N/A 08/28/2022   Procedure: EXCISION OF SUBCUTANEOUS CYST UPPER BACK;  Surgeon: Berna Bue, MD;  Location: [facility];  Service: General;  Laterality: N/A;   PILONIDAL CYST EXCISION N/A 07/18/2017   Procedure: CYST EXCISION PILONIDAL;  Surgeon: Berna Bue, MD;  Location: [facility];  Service: General;  Laterality: N/A;   UPPER GASTROINTESTINAL ENDOSCOPY     WISDOM  TOOTH EXTRACTION  2015   Social History   Tobacco Use   Smoking status: Former    Current packs/day: 0.00    Average packs/day: 0.5 packs/day for 5.0 years (2.5 ttl pk-yrs)    Types: Cigarettes, E-cigarettes    Start date: 05/15/2012    Quit date: 05/15/2017    Years since quitting: 6.0   Smokeless tobacco: Never   Tobacco comments:    07-15-2017  per pt quit smoking cig. 05-15-2017 but occasionally vapes  Vaping Use   Vaping status: Some Days  Substance Use Topics   Alcohol use: Not Currently    Comment: rarely   Drug use: No      ROS: as noted in HPI  Objective:     BP 121/81   Pulse 81   Temp 98 F (36.7 C) (Oral)   Wt 163 lb (73.9 kg)   SpO2 96%   BMI 30.80 kg/m  BP Readings from Last 3 Encounters:  05/23/23 121/81  05/01/23 119/81  04/14/23 106/64   Wt Readings from Last 3 Encounters:  05/23/23 163 lb (73.9 kg)  05/01/23 163 lb (73.9 kg)  04/14/23 161 lb 4 oz (73.1 kg)      Physical Exam Vitals and nursing note reviewed.  Constitutional:      General: She is not in acute distress.    Appearance: Normal appearance. She is not ill-appearing, toxic-appearing or diaphoretic.  HENT:     Head: Normocephalic and atraumatic.     Right Ear: Ear canal and external ear normal. A middle ear effusion is present. There is no impacted cerumen.     Left Ear: Ear canal and external ear normal. A middle ear effusion is present. There is no impacted cerumen.     Nose: Nose normal.     Right Sinus: No maxillary sinus tenderness or frontal sinus tenderness.     Left Sinus: No maxillary sinus tenderness or frontal sinus tenderness.     Mouth/Throat:     Lips: Pink.     Mouth: Mucous membranes are moist.     Pharynx: Oropharynx is clear. Uvula midline. Posterior oropharyngeal erythema present. No oropharyngeal exudate or uvula swelling.  Eyes:     General: No scleral icterus.       Right eye: No discharge.        Left eye: No discharge.     Extraocular Movements:  Extraocular movements intact.     Pupils: Pupils are equal, round, and reactive to light.  Neck:     Thyroid: No thyroid mass, thyromegaly or thyroid tenderness.  Cardiovascular:     Rate and Rhythm: Normal rate and regular rhythm.     Pulses: Normal pulses.  Pulmonary:     Effort: Pulmonary effort is normal. No respiratory distress.     Breath sounds: Normal breath sounds. No stridor. No wheezing or rhonchi.     Comments: Very harsh, persistent cough without wheezing  or stridor Musculoskeletal:     Cervical back: Normal range of motion and neck supple. No rigidity or tenderness.     Right lower leg: No edema.     Left lower leg: No edema.  Lymphadenopathy:     Cervical: Cervical adenopathy (B submandibular) present.  Skin:    General: Skin is warm.     Coloration: Skin is not jaundiced.     Findings: No bruising, erythema or rash.     Comments: Pt feels warm to touch and minimally diaphoretic  Neurological:     General: No focal deficit present.     Mental Status: She is alert and oriented to person, place, and time.     Sensory: No sensory deficit.     Motor: No weakness.  Psychiatric:        Mood and Affect: Mood normal.        Behavior: Behavior normal.      No results found for any visits on 05/23/23.  Last CBC Lab Results  Component Value Date   WBC 8.2 04/14/2023   HGB 14.2 04/14/2023   HCT 42.9 04/14/2023   MCV 82.5 04/14/2023   MCH 23.4 (L) 01/15/2023   RDW 18.1 (H) 04/14/2023   PLT 394.0 04/14/2023   Last metabolic panel Lab Results  Component Value Date   GLUCOSE 88 04/14/2023   NA 137 04/14/2023   K 3.9 04/14/2023   CL 101 04/14/2023   CO2 26 04/14/2023   BUN 10 04/14/2023   CREATININE 0.51 04/14/2023   GFR 127.23 04/14/2023   CALCIUM 9.5 04/14/2023   PROT 7.7 04/14/2023   ALBUMIN 4.7 04/14/2023   BILITOT 0.4 04/14/2023   ALKPHOS 69 04/14/2023   AST 11 04/14/2023   ALT 8 04/14/2023   ANIONGAP 10 01/15/2023      The ASCVD Risk score  (Arnett DK, et al., 2019) failed to calculate for the following reasons:   The 2019 ASCVD risk score is only valid for ages 80 to 62  Assessment & Plan:  Upper respiratory infection, acute -     Azithromycin; Take 2 tablets on day 1, then 1 tablet daily on days 2 through 5  Dispense: 6 tablet; Refill: 0 -     predniSONE; Take 1 tablet (50 mg total) by mouth daily with breakfast.  Dispense: 5 tablet; Refill: 0 -     HYDROcodone Bit-Homatrop MBr; Take 5 mLs by mouth at bedtime as needed for cough.  Dispense: 60 mL; Refill: 0  Moderate persistent asthma with acute exacerbation -     predniSONE; Take 1 tablet (50 mg total) by mouth daily with breakfast.  Dispense: 5 tablet; Refill: 0 -     Albuterol Sulfate; Take 3 mLs (2.5 mg total) by nebulization every 6 (six) hours as needed for wheezing or shortness of breath.  Dispense: 75 mL; Refill: 2 -     methylPREDNISolone Acetate -     HYDROcodone Bit-Homatrop MBr; Take 5 mLs by mouth at bedtime as needed for cough.  Dispense: 60 mL; Refill: 0   Assessment and Plan    Acute Respiratory Infection Severe coughing fits with chest and stomach pain, weakness, and visual disturbances during coughing. No fever, normal oxygen levels, and temperature. Asthma complicates symptoms. Mycoplasma infection suspected. Azithromycin and prednisone chosen for antibacterial, anti-inflammatory effects. - Prescribed azithromycin. - Administered Depo Medrol injection, start oral prednisone tomorrow. - Continue Airsupra inhaler as needed. - Consider chest x-ray if no improvement after 6 days. - Prescribed hydrocodone  cough syrup for nighttime use.  Asthma Current respiratory infection exacerbates asthma symptoms. Airsupra prioritized for quick relief. - Continue Airsupra, Wixela, and Singulair. - Rinse mouth after Airsupra use. - Consider nebulizer with albuterol if needed, prioritize Airsupra.  Gastroesophageal Reflux Disease (GERD) Managed with omeprazole. Percocet  prescribed for GI symptoms, used sparingly. - Continue omeprazole. - Use Percocet sparingly for GI issues.        No follow-ups on file.   Maretta Bees, PA

## 2023-05-23 NOTE — Patient Instructions (Addendum)
 Start the azithromycin per package directions. Given the injection of steroids here today, please do not start the oral prednisone until tomorrow morning. Take one tablet daily with breakfast.  Use the cough syrup prescribed today at night time only given its sedative effects.  Continue all your home asthma medications as previously ordered.

## 2023-05-27 ENCOUNTER — Encounter: Payer: Self-pay | Admitting: Gastroenterology

## 2023-05-27 ENCOUNTER — Ambulatory Visit: Payer: No Typology Code available for payment source | Admitting: Physician Assistant

## 2023-05-28 ENCOUNTER — Ambulatory Visit: Payer: No Typology Code available for payment source | Admitting: Physician Assistant

## 2023-05-28 NOTE — Telephone Encounter (Signed)
 Heather see the  message from the pt.  I will cancel the appt.

## 2023-06-03 ENCOUNTER — Other Ambulatory Visit: Payer: Self-pay | Admitting: Otolaryngology

## 2023-06-04 ENCOUNTER — Other Ambulatory Visit: Payer: Self-pay | Admitting: Internal Medicine

## 2023-06-12 ENCOUNTER — Encounter (HOSPITAL_COMMUNITY): Payer: Self-pay | Admitting: Otolaryngology

## 2023-06-12 ENCOUNTER — Other Ambulatory Visit: Payer: Self-pay

## 2023-06-12 NOTE — Progress Notes (Signed)
 SDW CALL  Patient was given pre-op instructions over the phone. The opportunity was given for the patient to ask questions. No further questions asked. Patient verbalized understanding of instructions given.   PCP - Laurina Popper Crain,PA Cardiologist - denies GI - Henry Danis,MD  PPM/ICD - denies Device Orders -  Rep Notified -   Chest x-ray - na EKG - na Stress Test - denies ECHO - denies Cardiac Cath - denies  Sleep Study - 04/10/22 CPAP - yes  Fasting Blood Sugar - na Checks Blood Sugar _____ times a day  Blood Thinner Instructions:na Aspirin Instructions:na  ERAS Protcol -clears until 0830 PRE-SURGERY Ensure or G2- na  COVID TEST- na   Anesthesia review: yes-Pt visited PCP 05/23/23 after having sob,severe cough,congestion with yellow,green nasal drainage. Treated for acute respiratory infection with Zpack and prednisone . Pt denies symptoms at this time and said she had no symptoms since 4/11.  Patient denies shortness of breath, fever, cough and chest pain over the phone call

## 2023-06-13 ENCOUNTER — Ambulatory Visit (HOSPITAL_COMMUNITY)
Admission: RE | Admit: 2023-06-13 | Discharge: 2023-06-13 | Disposition: A | Discharge status: Home / Self Care | Attending: Otolaryngology | Admitting: Otolaryngology

## 2023-06-13 ENCOUNTER — Other Ambulatory Visit: Payer: Self-pay

## 2023-06-13 ENCOUNTER — Encounter (HOSPITAL_COMMUNITY)
Admission: RE | Disposition: A | Discharge status: Home / Self Care | Payer: Self-pay | Source: Home / Self Care | Attending: Otolaryngology

## 2023-06-13 ENCOUNTER — Ambulatory Visit (HOSPITAL_COMMUNITY): Payer: Self-pay | Admitting: Anesthesiology

## 2023-06-13 ENCOUNTER — Ambulatory Visit (HOSPITAL_BASED_OUTPATIENT_CLINIC_OR_DEPARTMENT_OTHER): Payer: Self-pay | Admitting: Anesthesiology

## 2023-06-13 DIAGNOSIS — Z7951 Long term (current) use of inhaled steroids: Secondary | ICD-10-CM | POA: Insufficient documentation

## 2023-06-13 DIAGNOSIS — J3501 Chronic tonsillitis: Secondary | ICD-10-CM | POA: Diagnosis not present

## 2023-06-13 DIAGNOSIS — J45909 Unspecified asthma, uncomplicated: Secondary | ICD-10-CM | POA: Insufficient documentation

## 2023-06-13 DIAGNOSIS — G4733 Obstructive sleep apnea (adult) (pediatric): Secondary | ICD-10-CM | POA: Diagnosis not present

## 2023-06-13 DIAGNOSIS — K219 Gastro-esophageal reflux disease without esophagitis: Secondary | ICD-10-CM | POA: Insufficient documentation

## 2023-06-13 DIAGNOSIS — Z87891 Personal history of nicotine dependence: Secondary | ICD-10-CM | POA: Insufficient documentation

## 2023-06-13 DIAGNOSIS — G4730 Sleep apnea, unspecified: Secondary | ICD-10-CM

## 2023-06-13 HISTORY — DX: Other complications of anesthesia, initial encounter: T88.59XA

## 2023-06-13 HISTORY — PX: TONSILLECTOMY: SHX5217

## 2023-06-13 HISTORY — DX: Family history of other specified conditions: Z84.89

## 2023-06-13 LAB — CBC
HCT: 45.8 % (ref 36.0–46.0)
Hemoglobin: 14.6 g/dL (ref 12.0–15.0)
MCH: 27.8 pg (ref 26.0–34.0)
MCHC: 31.9 g/dL (ref 30.0–36.0)
MCV: 87.1 fL (ref 80.0–100.0)
Platelets: 346 10*3/uL (ref 150–400)
RBC: 5.26 MIL/uL — ABNORMAL HIGH (ref 3.87–5.11)
RDW: 14.6 % (ref 11.5–15.5)
WBC: 7.1 10*3/uL (ref 4.0–10.5)
nRBC: 0 % (ref 0.0–0.2)

## 2023-06-13 LAB — POCT PREGNANCY, URINE: Preg Test, Ur: NEGATIVE

## 2023-06-13 SURGERY — TONSILLECTOMY
Anesthesia: General | Site: Mouth | Laterality: Bilateral

## 2023-06-13 MED ORDER — ONDANSETRON HCL 4 MG/2ML IJ SOLN
INTRAMUSCULAR | Status: AC
Start: 2023-06-13 — End: ?
  Filled 2023-06-13: qty 2

## 2023-06-13 MED ORDER — ACETAMINOPHEN 500 MG PO TABS: 1000.0000 mg | ORAL_TABLET | Freq: Once | ORAL | Status: AC

## 2023-06-13 MED ORDER — 0.9 % SODIUM CHLORIDE (POUR BTL) OPTIME
TOPICAL | Status: DC | PRN
  Administered 2023-06-13: 1000 mL

## 2023-06-13 MED ORDER — HYDROCODONE-ACETAMINOPHEN 7.5-325 MG/15ML PO SOLN: 15.0000 mL | ORAL | 0 refills | Status: DC | PRN

## 2023-06-13 MED ORDER — FENTANYL CITRATE (PF) 250 MCG/5ML IJ SOLN
INTRAMUSCULAR | Status: DC | PRN
  Administered 2023-06-13: 50 ug via INTRAVENOUS
  Administered 2023-06-13: 100 ug via INTRAVENOUS
  Administered 2023-06-13 (×2): 50 ug via INTRAVENOUS

## 2023-06-13 MED ORDER — LIDOCAINE 2% (20 MG/ML) 5 ML SYRINGE
INTRAMUSCULAR | Status: DC | PRN
  Administered 2023-06-13: 60 mg via INTRAVENOUS

## 2023-06-13 MED ORDER — LACTATED RINGERS IV SOLN: INTRAVENOUS | Status: DC

## 2023-06-13 MED ORDER — LIDOCAINE 2% (20 MG/ML) 5 ML SYRINGE
INTRAMUSCULAR | Status: AC
  Filled 2023-06-13: qty 5

## 2023-06-13 MED ORDER — DROPERIDOL 2.5 MG/ML IJ SOLN
0.6250 mg | Freq: Once | INTRAMUSCULAR | Status: DC | PRN
Start: 2023-06-13 — End: 2023-06-13

## 2023-06-13 MED ORDER — FENTANYL CITRATE (PF) 250 MCG/5ML IJ SOLN
INTRAMUSCULAR | Status: AC
  Filled 2023-06-13: qty 5

## 2023-06-13 MED ORDER — ONDANSETRON HCL 4 MG/2ML IJ SOLN
INTRAMUSCULAR | Status: DC | PRN
  Administered 2023-06-13: 4 mg via INTRAVENOUS

## 2023-06-13 MED ORDER — ROCURONIUM BROMIDE 10 MG/ML (PF) SYRINGE
PREFILLED_SYRINGE | INTRAVENOUS | Status: DC | PRN
  Administered 2023-06-13: 40 mg via INTRAVENOUS

## 2023-06-13 MED ORDER — ACETAMINOPHEN 500 MG PO TABS
ORAL_TABLET | ORAL | Status: AC
  Administered 2023-06-13: 1000 mg via ORAL
  Filled 2023-06-13: qty 2

## 2023-06-13 MED ORDER — FENTANYL CITRATE (PF) 100 MCG/2ML IJ SOLN
INTRAMUSCULAR | Status: AC
  Filled 2023-06-13: qty 2

## 2023-06-13 MED ORDER — PROPOFOL 10 MG/ML IV BOLUS
INTRAVENOUS | Status: AC
  Filled 2023-06-13: qty 20

## 2023-06-13 MED ORDER — MIDAZOLAM HCL 2 MG/2ML IJ SOLN
INTRAMUSCULAR | Status: DC | PRN
  Administered 2023-06-13: 2 mg via INTRAVENOUS

## 2023-06-13 MED ORDER — DEXAMETHASONE SODIUM PHOSPHATE 10 MG/ML IJ SOLN
INTRAMUSCULAR | Status: AC
  Filled 2023-06-13: qty 1

## 2023-06-13 MED ORDER — OXYCODONE HCL 5 MG PO TABS: 5.0000 mg | ORAL_TABLET | Freq: Once | ORAL | Status: AC | PRN

## 2023-06-13 MED ORDER — OXYCODONE HCL 5 MG/5ML PO SOLN
5.0000 mg | Freq: Once | ORAL | Status: AC | PRN
  Administered 2023-06-13: 5 mg via ORAL

## 2023-06-13 MED ORDER — FENTANYL CITRATE (PF) 100 MCG/2ML IJ SOLN
25.0000 ug | INTRAMUSCULAR | Status: DC | PRN
  Administered 2023-06-13 (×2): 25 ug via INTRAVENOUS

## 2023-06-13 MED ORDER — ORAL CARE MOUTH RINSE: 15.0000 mL | Freq: Once | OROMUCOSAL | Status: AC

## 2023-06-13 MED ORDER — SUGAMMADEX SODIUM 200 MG/2ML IV SOLN
INTRAVENOUS | Status: DC | PRN
Start: 2023-06-13 — End: 2023-06-13
  Administered 2023-06-13: 200 mg via INTRAVENOUS

## 2023-06-13 MED ORDER — CHLORHEXIDINE GLUCONATE 0.12 % MT SOLN
15.0000 mL | Freq: Once | OROMUCOSAL | Status: AC
  Administered 2023-06-13: 15 mL via OROMUCOSAL
  Filled 2023-06-13: qty 15

## 2023-06-13 MED ORDER — DEXAMETHASONE SODIUM PHOSPHATE 10 MG/ML IJ SOLN
INTRAMUSCULAR | Status: DC | PRN
  Administered 2023-06-13: 10 mg via INTRAVENOUS

## 2023-06-13 MED ORDER — PROPOFOL 10 MG/ML IV BOLUS
INTRAVENOUS | Status: DC | PRN
  Administered 2023-06-13: 200 mg via INTRAVENOUS

## 2023-06-13 MED ORDER — CLINDAMYCIN PHOSPHATE 900 MG/50ML IV SOLN
900.0000 mg | INTRAVENOUS | Status: AC
  Administered 2023-06-13: 900 mg via INTRAVENOUS
  Filled 2023-06-13: qty 50

## 2023-06-13 MED ORDER — OXYCODONE HCL 5 MG/5ML PO SOLN
ORAL | Status: AC
  Filled 2023-06-13: qty 5

## 2023-06-13 MED ORDER — MIDAZOLAM HCL 2 MG/2ML IJ SOLN
INTRAMUSCULAR | Status: AC
  Filled 2023-06-13: qty 2

## 2023-06-13 MED ORDER — DEXMEDETOMIDINE HCL IN NACL 80 MCG/20ML IV SOLN
INTRAVENOUS | Status: DC | PRN
  Administered 2023-06-13: 4 ug via INTRAVENOUS
  Administered 2023-06-13: 8 ug via INTRAVENOUS
  Administered 2023-06-13 (×2): 4 ug via INTRAVENOUS

## 2023-06-13 SURGICAL SUPPLY — 25 items
BAG COUNTER SPONGE SURGICOUNT (BAG) ×1 IMPLANT
CANISTER SUCT 3000ML PPV (MISCELLANEOUS) ×1 IMPLANT
CATH ROBINSON RED A/P 10FR (CATHETERS) IMPLANT
CLEANER TIP ELECTROSURG 2X2 (MISCELLANEOUS) ×1 IMPLANT
COAGULATOR SUCT SWTCH 10FR 6 (ELECTROSURGICAL) ×1 IMPLANT
ELECT COATED BLADE 2.86 ST (ELECTRODE) ×1 IMPLANT
ELECTRODE REM PT RETRN 9FT PED (ELECTROSURGICAL) IMPLANT
ELECTRODE REM PT RTRN 9FT ADLT (ELECTROSURGICAL) IMPLANT
GAUZE 4X4 16PLY ~~LOC~~+RFID DBL (SPONGE) ×1 IMPLANT
GLOVE BIO SURGEON STRL SZ7.5 (GLOVE) ×1 IMPLANT
GOWN STRL REUS W/ TWL LRG LVL3 (GOWN DISPOSABLE) ×2 IMPLANT
KIT BASIN OR (CUSTOM PROCEDURE TRAY) ×1 IMPLANT
KIT TURNOVER KIT B (KITS) ×1 IMPLANT
NS IRRIG 1000ML POUR BTL (IV SOLUTION) ×1 IMPLANT
PACK SRG BSC III STRL LF ECLPS (CUSTOM PROCEDURE TRAY) ×1 IMPLANT
PAD ARMBOARD POSITIONER FOAM (MISCELLANEOUS) IMPLANT
PENCIL SMOKE EVACUATOR (MISCELLANEOUS) ×1 IMPLANT
POSITIONER HEAD DONUT 9IN (MISCELLANEOUS) ×1 IMPLANT
SPECIMEN JAR SMALL (MISCELLANEOUS) IMPLANT
SPONGE TONSIL 1.25 RF SGL STRG (GAUZE/BANDAGES/DRESSINGS) ×1 IMPLANT
SYR BULB EAR ULCER 3OZ GRN STR (SYRINGE) ×1 IMPLANT
TOWEL GREEN STERILE FF (TOWEL DISPOSABLE) ×1 IMPLANT
TUBE CONNECTING 12X1/4 (SUCTIONS) ×1 IMPLANT
TUBE SALEM SUMP 16F (TUBING) ×1 IMPLANT
YANKAUER SUCT BULB TIP NO VENT (SUCTIONS) ×1 IMPLANT

## 2023-06-13 NOTE — Anesthesia Postprocedure Evaluation (Signed)
 Anesthesia Post Note  Patient: Marissa Morales  Procedure(s) Performed: BILATRERAL TONSILLECTOMY (Bilateral: Mouth)     Patient location during evaluation: PACU Anesthesia Type: General Level of consciousness: awake and alert Pain management: pain level controlled Vital Signs Assessment: post-procedure vital signs reviewed and stable Respiratory status: spontaneous breathing, nonlabored ventilation, respiratory function stable and patient connected to nasal cannula oxygen Cardiovascular status: blood pressure returned to baseline and stable Postop Assessment: no apparent nausea or vomiting Anesthetic complications: no  No notable events documented.  Last Vitals:  Vitals:   06/13/23 1445 06/13/23 1500  BP: 116/78 124/83  Pulse: 74 88  Resp: 16 18  Temp:  36.7 C  SpO2: 95% 96%    Last Pain:  Vitals:   06/13/23 1500  TempSrc:   PainSc: 2                  Melvenia Stabs

## 2023-06-13 NOTE — Op Note (Signed)
 Preop diagnosis: Obstructive sleep apnea Postop diagnosis: same Procedure: Tonsillectomy Surgeon: Tellis Feathers Anesth: General Compl: None Findings: Tonsils 1+ Description:  After discussing risks, benefits, and alternatives, the patient was brought to the operative suite and placed on the operative table in the supine position.  Anesthesia was induced and the patient was intubated by the anesthesia team without difficulty.  The bed was turned 90 degrees from anesthesia and the eyes were taped closed.  The patient was given IV Decadron .  A head wrap was placed around the patient's head and the oropharynx was exposed with a Crow-Davis retractor that was placed in suspension on the Mayo stand.   The right tonsil was grasped with a curved Allis and retracted medially while a curvilinear incision was made with the Bovie electrocautery.  Dissection continued in the subcapsular plane until the tonsil was removed.  The same procedure was then performed on the left side.  Tonsils were not sent for pathology.  Bleeding was controlled using suction cautery.  After this was completed, the red rubber catheter was removed and the mouth and nose were copiously irrigated with saline.  A flexible suction catheter was passed down the esophagus to suction out the stomach and esophagus.  The Crow-Davis retractor was taken out of suspension and removed from the patient's mouth.  She was then turned back to anesthesia for wake-up and was extubated and moved to the recovery room in stable condition.

## 2023-06-13 NOTE — Transfer of Care (Signed)
 Immediate Anesthesia Transfer of Care Note  Patient: Marissa Morales  Procedure(s) Performed: BILATRERAL TONSILLECTOMY (Bilateral: Mouth)  Patient Location: PACU  Anesthesia Type:General  Level of Consciousness: awake and alert   Airway & Oxygen Therapy: Patient Spontanous Breathing and Patient connected to face mask oxygen  Post-op Assessment: Report given to RN and Post -op Vital signs reviewed and stable  Post vital signs: Reviewed and stable  Last Vitals:  Vitals Value Taken Time  BP 125/90 06/13/23 1400  Temp 36.7 C 06/13/23 1400  Pulse 92 06/13/23 1401  Resp 12 06/13/23 1401  SpO2 100 % 06/13/23 1401  Vitals shown include unfiled device data.  Last Pain:  Vitals:   06/13/23 1001  TempSrc:   PainSc: 0-No pain         Complications: No notable events documented.

## 2023-06-13 NOTE — Anesthesia Procedure Notes (Signed)
 Procedure Name: Intubation Date/Time: 06/13/2023 1:20 PM  Performed by: Gabe Jock, CRNAPre-anesthesia Checklist: Patient identified, Emergency Drugs available, Suction available and Patient being monitored Patient Re-evaluated:Patient Re-evaluated prior to induction Oxygen Delivery Method: Circle System Utilized Preoxygenation: Pre-oxygenation with 100% oxygen Induction Type: IV induction Ventilation: Mask ventilation without difficulty Laryngoscope Size: Mac and 4 Grade View: Grade I Tube type: Oral Tube size: 7.0 mm Number of attempts: 1 Airway Equipment and Method: Stylet Placement Confirmation: ETT inserted through vocal cords under direct vision, positive ETCO2 and breath sounds checked- equal and bilateral Secured at: 22 cm Tube secured with: Tape Dental Injury: Teeth and Oropharynx as per pre-operative assessment

## 2023-06-13 NOTE — H&P (Signed)
 Marissa Morales is an 28 y.o. female.   Chief Complaint: Sleep apnea HPI: 28 year old female with sleep apnea who has been struggling to tolerate CPAP.  Past Medical History:  Diagnosis Date   Allergy     cats   Anemia    Anxiety    panic attacks   Asthma    as a child - dirty cats will cause an asthma attack   Complication of anesthesia    pt said she was told she had an asthma attack during an upper GI endoscopy and was given a nebulizer treatment.   Crohn's disease (HCC)    Depression    Family history of adverse reaction to anesthesia    pt states her mother wakes up during anesthesia   GERD (gastroesophageal reflux disease)    Hemorrhoids    History of asthma    childhood   History of kidney stones    IBS (irritable bowel syndrome)    Pilonidal cyst    Sleep apnea    Wears glasses     Past Surgical History:  Procedure Laterality Date   ANAL RECTAL MANOMETRY N/A 07/24/2020   Procedure: ANO RECTAL MANOMETRY;  Surgeon: Albertina Hugger, MD;  Location: [facility];  Service: Gastroenterology;  Laterality: N/A;   BIOPSY  01/15/2023   Procedure: BIOPSY;  Surgeon: Daina Drum, MD;  Location: [facility];  Service: Gastroenterology;;   COLONOSCOPY     COLONOSCOPY WITH ESOPHAGOGASTRODUODENOSCOPY (EGD)  x2  last one 2015   FLEXIBLE SIGMOIDOSCOPY N/A 01/15/2023   Procedure: FLEXIBLE SIGMOIDOSCOPY;  Surgeon: Daina Drum, MD;  Location: [facility];  Service: Gastroenterology;  Laterality: N/A;   KNEE ARTHROSCOPY Left 03/30/2020   LIPOMA EXCISION N/A 08/28/2022   Procedure: EXCISION OF SUBCUTANEOUS CYST UPPER BACK;  Surgeon: Adalberto Acton, MD;  Location: [facility];  Service: General;  Laterality: N/A;   PILONIDAL CYST EXCISION N/A 07/18/2017   Procedure: CYST EXCISION PILONIDAL;  Surgeon: Adalberto Acton, MD;  Location: [facility];  Service: General;  Laterality: N/A;   UPPER GASTROINTESTINAL ENDOSCOPY     WISDOM TOOTH EXTRACTION  2015    Family  History  Problem Relation Age of Onset   Asthma Mother    Irritable bowel syndrome Mother    Endometriosis Mother    Colon cancer Mother    COPD Mother    Hypertension Father    Colon polyps Father    Endometriosis Sister    Irritable bowel syndrome Sister    Diverticulitis Sister    Heart attack Maternal Grandmother    Breast cancer Paternal Grandfather    Colon cancer Maternal Uncle    Liver cancer Neg Hx    Rectal cancer Neg Hx    Pancreatic cancer Neg Hx    Stomach cancer Neg Hx    Esophageal cancer Neg Hx    Social History:  reports that she quit smoking about 6 years ago. Her smoking use included cigarettes and e-cigarettes. She started smoking about 11 years ago. She has a 2.5 pack-year smoking history. She has never used smokeless tobacco. She reports that she does not currently use alcohol. She reports that she does not use drugs.  Allergies:  Allergies  Allergen Reactions   Wound Dressing Adhesive Other (See Comments)    "Burns" the skin- SKIN IS VERY SENSITIVE!!   Bactrim [Sulfamethoxazole-Trimethoprim] Nausea And Vomiting   Bismuth Subsalicylate Nausea And Vomiting   Bismuth-Containing Compounds Nausea And Vomiting   Cefpodoxime Nausea And  Vomiting and Other (See Comments)    Childhood rx - GI upset    Doxycycline Nausea And Vomiting   Ibuprofen Other (See Comments)    Cannot take due to Crohn's disease   Silicone Other (See Comments)    BURNS AND PULLS OFF THE SKIN   Vancomycin  Itching and Other (See Comments)    Severe pruritus of scalp, flushing  Red Man Syndrome    Medications Prior to Admission  Medication Sig Dispense Refill   Albuterol -Budesonide  (AIRSUPRA ) 90-80 MCG/ACT AERO Inhale 2 puffs into the lungs every 6 (six) hours as needed. 1 g 11   Azelastine -Fluticasone  137-50 MCG/ACT SUSP Place 1 spray into the nose daily. (Patient taking differently: Place 1 spray into both nostrils daily.) 23 g 11   busPIRone  (BUSPAR ) 10 MG tablet Take 1 tablet (10  mg total) by mouth 2 (two) times daily. 180 tablet 1   cetirizine (ZYRTEC) 10 MG tablet Take 10 mg by mouth at bedtime.     Clindamycin  Phos-Benzoyl Perox gel Apply 1 application  topically 2 (two) times daily. (Patient taking differently: Apply 1 application  topically daily.) 50 g 0   clobetasol cream (TEMOVATE) 0.05 % Apply 1 Application topically 2 (two) times daily as needed (irritation- affected areas).     diclofenac Sodium (VOLTAREN) 1 % GEL Apply 2-3 g topically 4 (four) times daily as needed (pain).     fluticasone -salmeterol (ADVAIR) 250-50 MCG/ACT AEPB Inhale 1 puff into the lungs in the morning and at bedtime.     hydrocortisone  cream 1 % Apply to affected area 2 times daily (Patient taking differently: Apply 1 Application topically 2 (two) times daily as needed for itching.) 15 g 0   hydrOXYzine (ATARAX) 25 MG tablet Take 25 mg by mouth daily as needed for anxiety (or panic attacks).     lubiprostone  (AMITIZA ) 8 MCG capsule TAKE 1 CAPSULE (8 MCG TOTAL) BY MOUTH 2 (TWO) TIMES DAILY WITH A MEAL. 60 capsule 2   montelukast  (SINGULAIR ) 10 MG tablet TAKE 1 TABLET BY MOUTH EVERYDAY AT BEDTIME 30 tablet 0   omeprazole  (PRILOSEC) 40 MG capsule Take 1 capsule (40 mg total) by mouth daily. 90 capsule 2   oxyCODONE -acetaminophen  (PERCOCET) 5-325 MG tablet Take 1-2 tablets by mouth every 6 (six) hours as needed for severe pain (pain score 7-10). 20 tablet 0   polyethylene glycol (MIRALAX ) 17 g packet Take 17 g by mouth daily as needed for mild constipation. 14 each 0   Polyethylene Glycol 400 (BLINK TEARS) 0.25 % SOLN Place 1 drop into both eyes daily as needed (dry eye).     sertraline  (ZOLOFT ) 50 MG tablet Take 50 mg by mouth in the morning.     spironolactone  (ALDACTONE ) 25 MG tablet Take 1 tablet (25 mg total) by mouth daily. (Patient taking differently: Take 12.5 mg by mouth 2 (two) times daily.) 90 tablet 3   traZODone  (DESYREL ) 50 MG tablet TAKE 1 TABLET BY MOUTH EVERYDAY AT BEDTIME 90  tablet 3   albuterol  (PROVENTIL ) (2.5 MG/3ML) 0.083% nebulizer solution Take 3 mLs (2.5 mg total) by nebulization every 6 (six) hours as needed for wheezing or shortness of breath. 75 mL 2   dicyclomine  (BENTYL ) 10 MG capsule TAKE 1 CAPSULE 2-3 TIMES A DAY AS NEEDED FOR ABDOMINAL CRAMPS (Patient taking differently: Take 10 mg by mouth See admin instructions. TAKE 10 MG BY MOUTH 2-3 TIMES A DAY AS NEEDED FOR ABDOMINAL CRAMPS) 60 capsule 1   Fluticasone -Umeclidin-Vilant (TRELEGY ELLIPTA ) 100-62.5-25 MCG/ACT  AEPB Inhale 1 puff into the lungs daily. (Patient not taking: Reported on 06/10/2023) 1 each 11   Risankizumab -rzaa (SKYRIZI ) 180 MG/1.2ML SOCT Inject 180 mg into the skin every 8 (eight) weeks. 1.2 mL 5    Results for orders placed or performed during the hospital encounter of 06/13/23 (from the past 48 hours)  CBC     Status: Abnormal   Collection Time: 06/13/23  9:51 AM  Result Value Ref Range   WBC 7.1 4.0 - 10.5 K/uL   RBC 5.26 (H) 3.87 - 5.11 MIL/uL   Hemoglobin 14.6 12.0 - 15.0 g/dL   HCT 16.1 09.6 - 04.5 %   MCV 87.1 80.0 - 100.0 fL   MCH 27.8 26.0 - 34.0 pg   MCHC 31.9 30.0 - 36.0 g/dL   RDW 40.9 81.1 - 91.4 %   Platelets 346 150 - 400 K/uL   nRBC 0.0 0.0 - 0.2 %    Comment: Performed at [facility], [address] [place], [place] [address]  Pregnancy, urine POC     Status: None   Collection Time: 06/13/23 10:06 AM  Result Value Ref Range   Preg Test, Ur NEGATIVE NEGATIVE    Comment:        THE SENSITIVITY OF THIS METHODOLOGY IS >24 mIU/mL    No results found.  Review of Systems  All other systems reviewed and are negative.   Blood pressure 110/73, pulse 73, temperature 98 F (36.7 C), temperature source Oral, resp. rate 20, height 5\' 1"  (1.549 m), weight 73.9 kg, last menstrual period 05/20/2023, SpO2 97%. Physical Exam Constitutional:      Appearance: Normal appearance.  HENT:     Head: Normocephalic and atraumatic.     Right Ear: External ear normal.      Left Ear: External ear normal.     Nose: Nose normal.     Mouth/Throat:     Mouth: Mucous membranes are moist.     Pharynx: Oropharynx is clear.  Eyes:     Extraocular Movements: Extraocular movements intact.     Pupils: Pupils are equal, round, and reactive to light.  Cardiovascular:     Rate and Rhythm: Normal rate.  Pulmonary:     Effort: Pulmonary effort is normal.  Skin:    General: Skin is warm and dry.  Neurological:     General: No focal deficit present.     Mental Status: She is alert and oriented to person, place, and time.  Psychiatric:        Mood and Affect: Mood normal.        Behavior: Behavior normal.        Thought Content: Thought content normal.        Judgment: Judgment normal.      Assessment/Plan Obstructive sleep apnea  To OR for tonsillectomy.  Virgina Grills, MD 06/13/2023, 1:00 PM

## 2023-06-13 NOTE — Anesthesia Preprocedure Evaluation (Signed)
 Anesthesia Evaluation  Patient identified by MRN, date of birth, ID band Patient awake    Reviewed: Allergy  & Precautions, NPO status , Patient's Chart, lab work & pertinent test results  Airway Mallampati: III  TM Distance: >3 FB Neck ROM: Full    Dental  (+) Dental Advisory Given   Pulmonary asthma , sleep apnea , former smoker   breath sounds clear to auscultation       Cardiovascular negative cardio ROS  Rhythm:Regular Rate:Normal     Neuro/Psych  PSYCHIATRIC DISORDERS      negative neurological ROS     GI/Hepatic Neg liver ROS, PUD,GERD  ,,  Endo/Other  negative endocrine ROS    Renal/GU negative Renal ROS     Musculoskeletal   Abdominal   Peds  Hematology negative hematology ROS (+)   Anesthesia Other Findings   Reproductive/Obstetrics                             Anesthesia Physical Anesthesia Plan  ASA: 2  Anesthesia Plan: General   Post-op Pain Management: Tylenol  PO (pre-op)*   Induction: Intravenous  PONV Risk Score and Plan: 3 and Dexamethasone , Ondansetron , Treatment may vary due to age or medical condition and Midazolam   Airway Management Planned: Oral ETT  Additional Equipment: None  Intra-op Plan:   Post-operative Plan: Extubation in OR  Informed Consent: I have reviewed the patients History and Physical, chart, labs and discussed the procedure including the risks, benefits and alternatives for the proposed anesthesia with the patient or authorized representative who has indicated his/her understanding and acceptance.     Dental advisory given  Plan Discussed with: CRNA  Anesthesia Plan Comments:        Anesthesia Quick Evaluation

## 2023-06-14 ENCOUNTER — Encounter (HOSPITAL_COMMUNITY): Payer: Self-pay | Admitting: Otolaryngology

## 2023-06-16 ENCOUNTER — Ambulatory Visit: Admitting: Urgent Care

## 2023-07-07 ENCOUNTER — Other Ambulatory Visit: Payer: Self-pay | Admitting: Internal Medicine

## 2023-07-28 ENCOUNTER — Encounter: Payer: Self-pay | Admitting: Urgent Care

## 2023-07-28 ENCOUNTER — Ambulatory Visit (INDEPENDENT_AMBULATORY_CARE_PROVIDER_SITE_OTHER): Admitting: Urgent Care

## 2023-07-28 VITALS — BP 118/83 | HR 90 | Temp 98.1°F | Ht 61.0 in | Wt 159.8 lb

## 2023-07-28 DIAGNOSIS — Z0000 Encounter for general adult medical examination without abnormal findings: Secondary | ICD-10-CM | POA: Diagnosis not present

## 2023-07-28 DIAGNOSIS — Z131 Encounter for screening for diabetes mellitus: Secondary | ICD-10-CM | POA: Diagnosis not present

## 2023-07-28 DIAGNOSIS — L700 Acne vulgaris: Secondary | ICD-10-CM | POA: Diagnosis not present

## 2023-07-28 DIAGNOSIS — J4540 Moderate persistent asthma, uncomplicated: Secondary | ICD-10-CM | POA: Diagnosis not present

## 2023-07-28 DIAGNOSIS — Z1159 Encounter for screening for other viral diseases: Secondary | ICD-10-CM

## 2023-07-28 DIAGNOSIS — Z111 Encounter for screening for respiratory tuberculosis: Secondary | ICD-10-CM

## 2023-07-28 MED ORDER — CLINDAMYCIN PHOS (TWICE-DAILY) 1 % EX GEL: Freq: Two times a day (BID) | CUTANEOUS | 2 refills | Status: DC

## 2023-07-28 MED ORDER — SPIRONOLACTONE 25 MG PO TABS: 25.0000 mg | ORAL_TABLET | Freq: Two times a day (BID) | ORAL | 1 refills | Status: AC

## 2023-07-28 MED ORDER — MONTELUKAST SODIUM 10 MG PO TABS: 10.0000 mg | ORAL_TABLET | Freq: Every day | ORAL | 1 refills | Status: DC

## 2023-07-28 NOTE — Progress Notes (Signed)
 Established Patient Office Visit  Subjective:  Patient ID: Marissa Morales, female    DOB: 11/24/1995  Age: 28 y.o. MRN: [id number]  Chief Complaint  Patient presents with   Annual Exam    Fasting CPE. Pt needs forms completed for nursing school. Pap completed In November.    28 yo with hx of crohns with constipation, anxiety and depression and asthma presents today for annual PE. No acute complaints, feels that all chronic conditions are well controlled. All vaccinations UTD. Last pap smear completed in 01/2023 and was WNL. Does need to update fasting labs. Would like refills of medications - requesting face gel without benzoyl peroxide as this has been bleaching her clothes. 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 Eating and sleeping well.   Patient Active Problem List   Diagnosis Date Noted   Crohn's disease (HCC) 01/14/2023   Anemia due to blood loss 12/18/2022   Mild obstructive sleep apnea 04/24/2022   Obesity (BMI 30.0-34.9) 04/24/2022   Crohn's disease of both small and large intestine with rectal bleeding (HCC) 10/23/2021   Chronic idiopathic constipation    Anxiety and depression 06/17/2020   Asthma 06/17/2020   Ulcerative colitis (HCC) 06/16/2020   Rectal bleeding 11/25/2019   Emesis 03/16/2013   Nausea 03/16/2013   Constipated 12/15/2012   Dysphagia 09/28/2012   Diarrhea 09/28/2012   Weight loss 09/28/2012   Hematochezia 09/28/2012   History of rectal bleeding 05/29/2011   Abdominal pain 03/01/2011   Past Medical History:  Diagnosis Date   Allergy     cats   Anemia    Anxiety    panic attacks   Asthma    as a child - dirty cats will cause an asthma attack   Complication of anesthesia    pt said she was told she had an asthma attack during an upper GI endoscopy and was given a nebulizer treatment.   Crohn's disease (HCC)    Depression    Family history of adverse reaction to anesthesia    pt states her mother wakes up during anesthesia   GERD  (gastroesophageal reflux disease)    Hemorrhoids    History of asthma    childhood   History of kidney stones    IBS (irritable bowel syndrome)    Pilonidal cyst    Sleep apnea    Wears glasses    Past Surgical History:  Procedure Laterality Date   ANAL RECTAL MANOMETRY N/A 07/24/2020   Procedure: ANO RECTAL MANOMETRY;  Surgeon: Albertina Hugger, MD;  Location: [facility];  Service: Gastroenterology;  Laterality: N/A;   BIOPSY  01/15/2023   Procedure: BIOPSY;  Surgeon: Daina Drum, MD;  Location: [facility];  Service: Gastroenterology;;   COLONOSCOPY     COLONOSCOPY WITH ESOPHAGOGASTRODUODENOSCOPY (EGD)  x2  last one 2015   FLEXIBLE SIGMOIDOSCOPY N/A 01/15/2023   Procedure: FLEXIBLE SIGMOIDOSCOPY;  Surgeon: Daina Drum, MD;  Location: [facility];  Service: Gastroenterology;  Laterality: N/A;   KNEE ARTHROSCOPY Left 03/30/2020   LIPOMA EXCISION N/A 08/28/2022   Procedure: EXCISION OF SUBCUTANEOUS CYST UPPER BACK;  Surgeon: Adalberto Acton, MD;  Location: [facility];  Service: General;  Laterality: N/A;   PILONIDAL CYST EXCISION N/A 07/18/2017   Procedure: CYST EXCISION PILONIDAL;  Surgeon: Adalberto Acton, MD;  Location: [facility];  Service: General;  Laterality: N/A;   TONSILLECTOMY Bilateral 06/13/2023   Procedure: BILATRERAL TONSILLECTOMY;  Surgeon: Virgina Grills, MD;  Location: [facility] OR;  Service:  ENT;  Laterality: Bilateral;   UPPER GASTROINTESTINAL ENDOSCOPY     WISDOM TOOTH EXTRACTION  2015   Social History   Tobacco Use   Smoking status: Former    Current packs/day: 0.00    Average packs/day: 0.5 packs/day for 5.0 years (2.5 ttl pk-yrs)    Types: Cigarettes, E-cigarettes    Start date: 05/15/2012    Quit date: 05/15/2017    Years since quitting: 6.2   Smokeless tobacco: Never   Tobacco comments:    07-15-2017  per pt quit smoking cig. 05-15-2017 but occasionally vapes  Vaping Use   Vaping status: Some Days  Substance Use Topics   Alcohol  use: Not Currently    Comment: rarely   Drug use: No      ROS: as noted in HPI  Objective:     BP 118/83   Pulse 90   Temp 98.1 F (36.7 C) (Oral)   Ht 5\' 1"  (1.549 m)   Wt 159 lb 12.8 oz (72.5 kg)   SpO2 97%   BMI 30.19 kg/m  BP Readings from Last 3 Encounters:  07/28/23 118/83  06/13/23 124/83  05/23/23 121/81   Wt Readings from Last 3 Encounters:  07/28/23 159 lb 12.8 oz (72.5 kg)  06/13/23 163 lb (73.9 kg)  05/23/23 163 lb (73.9 kg)      Physical Exam Vitals and nursing note reviewed.  Constitutional:      General: She is not in acute distress.    Appearance: Normal appearance. She is not ill-appearing, toxic-appearing or diaphoretic.  HENT:     Head: Normocephalic and atraumatic.     Right Ear: Tympanic membrane, ear canal and external ear normal. There is no impacted cerumen.     Left Ear: Tympanic membrane, ear canal and external ear normal. There is no impacted cerumen.     Nose: Nose normal.     Mouth/Throat:     Mouth: Mucous membranes are moist.     Pharynx: Oropharynx is clear. No oropharyngeal exudate or posterior oropharyngeal erythema.  Eyes:     General: No scleral icterus.       Right eye: No discharge.        Left eye: No discharge.     Extraocular Movements: Extraocular movements intact.     Pupils: Pupils are equal, round, and reactive to light.  Neck:     Thyroid : No thyroid  mass, thyromegaly or thyroid  tenderness.  Cardiovascular:     Rate and Rhythm: Normal rate and regular rhythm.     Pulses: Normal pulses.     Heart sounds: No murmur heard. Pulmonary:     Effort: Pulmonary effort is normal. No respiratory distress.     Breath sounds: Normal breath sounds. No stridor. No wheezing or rhonchi.  Abdominal:     General: Abdomen is flat. Bowel sounds are normal. There is no distension.     Palpations: Abdomen is soft. There is no mass.     Tenderness: There is no abdominal tenderness. There is no guarding.  Musculoskeletal:      Cervical back: Normal range of motion and neck supple. No rigidity or tenderness.     Right lower leg: No edema.     Left lower leg: No edema.  Lymphadenopathy:     Cervical: No cervical adenopathy.  Skin:    General: Skin is warm and dry.     Coloration: Skin is not jaundiced.     Findings: No bruising, erythema or rash.  Comments: Acne to upper back  Neurological:     General: No focal deficit present.     Mental Status: She is alert and oriented to person, place, and time.     Sensory: No sensory deficit.     Motor: No weakness.  Psychiatric:        Mood and Affect: Mood normal.        Behavior: Behavior normal.      Hearing Screening  Method: Audiometry   500Hz  1000Hz  2000Hz  3000Hz   Right ear 35 35 35 35  Left ear 35 35 35 35   Vision Screening   Right eye Left eye Both eyes  Without correction 20/20 20/20 20/20   With correction       No results found for any visits on 07/28/23.  Last CBC Lab Results  Component Value Date   WBC 7.1 06/13/2023   HGB 14.6 06/13/2023   HCT 45.8 06/13/2023   MCV 87.1 06/13/2023   MCH 27.8 06/13/2023   RDW 14.6 06/13/2023   PLT 346 06/13/2023   Last metabolic panel Lab Results  Component Value Date   GLUCOSE 88 04/14/2023   NA 137 04/14/2023   K 3.9 04/14/2023   CL 101 04/14/2023   CO2 26 04/14/2023   BUN 10 04/14/2023   CREATININE 0.51 04/14/2023   GFR 127.23 04/14/2023   CALCIUM 9.5 04/14/2023   PROT 7.7 04/14/2023   ALBUMIN 4.7 04/14/2023   BILITOT 0.4 04/14/2023   ALKPHOS 69 04/14/2023   AST 11 04/14/2023   ALT 8 04/14/2023   ANIONGAP 10 01/15/2023   Last lipids No results found for: "CHOL", "HDL", "LDLCALC", "LDLDIRECT", "TRIG", "CHOLHDL" Last hemoglobin A1c No results found for: "HGBA1C" Last thyroid  functions Lab Results  Component Value Date   TSH 0.99 01/31/2022   Last vitamin D  No results found for: "25OHVITD2", "25OHVITD3", "VD25OH" Last vitamin B12 and Folate No results found for:  "VITAMINB12", "FOLATE"    The ASCVD Risk score (Arnett DK, et al., 2019) failed to calculate for the following reasons:   The 2019 ASCVD risk score is only valid for ages 72 to 86  Assessment & Plan:  Well adult health check -     CBC -     Comprehensive metabolic panel with GFR -     Hemoglobin A1c -     Lipid panel -     TSH  Acne vulgaris -     Spironolactone ; Take 1 tablet (25 mg total) by mouth 2 (two) times daily.  Dispense: 180 tablet; Refill: 1 -     Clindamycin  Phos (Twice-Daily); Apply topically 2 (two) times daily.  Dispense: 60 g; Refill: 2  Moderate persistent asthma without complication -     Montelukast  Sodium; Take 1 tablet (10 mg total) by mouth at bedtime.  Dispense: 90 tablet; Refill: 1  Need for hepatitis C screening test -     Hepatitis C antibody  Screening-pulmonary TB -     QuantiFERON-TB Gold Plus -     QuantiFERON-TB Gold Plus  Encounter for screening for viral disease -     Measles/Mumps/Rubella Immunity  Pt doing well overall, chronic conditions stable / managed with current medication regimen.   No follow-ups on file.   Mandy Second, PA

## 2023-07-29 ENCOUNTER — Ambulatory Visit: Payer: Self-pay | Admitting: Urgent Care

## 2023-07-29 LAB — COMPREHENSIVE METABOLIC PANEL WITH GFR
ALT: 9 U/L (ref 0–35)
AST: 11 U/L (ref 0–37)
Albumin: 4.9 g/dL (ref 3.5–5.2)
Alkaline Phosphatase: 96 U/L (ref 39–117)
BUN: 7 mg/dL (ref 6–23)
CO2: 28 meq/L (ref 19–32)
Calcium: 10.2 mg/dL (ref 8.4–10.5)
Chloride: 101 meq/L (ref 96–112)
Creatinine, Ser: 0.6 mg/dL (ref 0.40–1.20)
GFR: 122.09 mL/min (ref >60.00)
Glucose, Bld: 89 mg/dL (ref 70–99)
Potassium: 4.2 meq/L (ref 3.5–5.1)
Sodium: 138 meq/L (ref 135–145)
Total Bilirubin: 0.5 mg/dL (ref 0.2–1.2)
Total Protein: 7.7 g/dL (ref 6.0–8.3)

## 2023-07-29 LAB — LIPID PANEL
Cholesterol: 208 mg/dL — ABNORMAL HIGH (ref 0–200)
HDL: 50.2 mg/dL (ref >39.00)
LDL Cholesterol: 133 mg/dL — ABNORMAL HIGH (ref 0–99)
NonHDL: 158.04
Total CHOL/HDL Ratio: 4
Triglycerides: 124 mg/dL (ref 0.0–149.0)
VLDL: 24.8 mg/dL (ref 0.0–40.0)

## 2023-07-29 LAB — CBC
HCT: 44.9 % (ref 36.0–46.0)
Hemoglobin: 14.6 g/dL (ref 12.0–15.0)
MCHC: 32.6 g/dL (ref 30.0–36.0)
MCV: 85.3 fl (ref 78.0–100.0)
Platelets: 437 10*3/uL — ABNORMAL HIGH (ref 150.0–400.0)
RBC: 5.26 Mil/uL — ABNORMAL HIGH (ref 3.87–5.11)
RDW: 15.3 % (ref 11.5–15.5)
WBC: 9.2 10*3/uL (ref 4.0–10.5)

## 2023-07-29 LAB — MEASLES/MUMPS/RUBELLA IMMUNITY
Mumps IgG: 28.4 [AU]/ml
Rubella: 1.12 {index}
Rubeola IgG: >300 [AU]/ml

## 2023-07-29 LAB — HEMOGLOBIN A1C: Hgb A1c MFr Bld: 5.6 % (ref 4.6–6.5)

## 2023-07-29 LAB — HEPATITIS C ANTIBODY: Hepatitis C Ab: NONREACTIVE

## 2023-07-31 LAB — QUANTIFERON-TB GOLD PLUS
Mitogen-NIL: 8.74 [IU]/mL
NIL: 0.02 [IU]/mL
QuantiFERON-TB Gold Plus: NEGATIVE
TB1-NIL: 0.01 [IU]/mL
TB2-NIL: 0 [IU]/mL

## 2023-07-31 LAB — TSH: TSH: 0.93 u[IU]/mL (ref 0.35–5.50)

## 2023-08-07 ENCOUNTER — Other Ambulatory Visit: Payer: Self-pay | Admitting: Gastroenterology

## 2023-08-08 NOTE — Telephone Encounter (Signed)
 This is a Crohn's patient of mine with a refill request for trazodone .  I initially wrote this prescription for short-term use and before she had other changes in her medicines, including those for mental health.  There are potential interactions between trazodone  and other meds, so I think it would be best for her to discuss this medication with primary care and obtain a prescription from them if they think it is appropriate.  Memory Staggers MD

## 2023-08-11 ENCOUNTER — Other Ambulatory Visit: Payer: Self-pay | Admitting: Gastroenterology

## 2023-08-11 NOTE — Telephone Encounter (Signed)
 Corean,  Please see my note to Grayce Loge about this refill request on June 20.  Grayce is either not seen that message or not attended to it.  Please work on that, thanks  VEAR Brand MD

## 2023-08-12 ENCOUNTER — Ambulatory Visit (INDEPENDENT_AMBULATORY_CARE_PROVIDER_SITE_OTHER): Admitting: Physician Assistant

## 2023-08-12 ENCOUNTER — Other Ambulatory Visit (INDEPENDENT_AMBULATORY_CARE_PROVIDER_SITE_OTHER)

## 2023-08-12 ENCOUNTER — Other Ambulatory Visit: Payer: Self-pay | Admitting: Physician Assistant

## 2023-08-12 ENCOUNTER — Ambulatory Visit: Payer: Self-pay | Admitting: Physician Assistant

## 2023-08-12 ENCOUNTER — Encounter: Payer: Self-pay | Admitting: Physician Assistant

## 2023-08-12 ENCOUNTER — Encounter: Payer: Self-pay | Admitting: Gastroenterology

## 2023-08-12 VITALS — BP 118/68 | HR 80 | Ht 61.0 in | Wt 162.0 lb

## 2023-08-12 DIAGNOSIS — K50811 Crohn's disease of both small and large intestine with rectal bleeding: Secondary | ICD-10-CM | POA: Diagnosis not present

## 2023-08-12 DIAGNOSIS — K50911 Crohn's disease, unspecified, with rectal bleeding: Secondary | ICD-10-CM

## 2023-08-12 DIAGNOSIS — K6289 Other specified diseases of anus and rectum: Secondary | ICD-10-CM | POA: Diagnosis not present

## 2023-08-12 DIAGNOSIS — K649 Unspecified hemorrhoids: Secondary | ICD-10-CM | POA: Diagnosis not present

## 2023-08-12 LAB — COMPREHENSIVE METABOLIC PANEL WITH GFR
ALT: 8 U/L (ref 0–35)
AST: 10 U/L (ref 0–37)
Albumin: 4.6 g/dL (ref 3.5–5.2)
Alkaline Phosphatase: 73 U/L (ref 39–117)
BUN: 11 mg/dL (ref 6–23)
CO2: 23 meq/L (ref 19–32)
Calcium: 9.3 mg/dL (ref 8.4–10.5)
Chloride: 105 meq/L (ref 96–112)
Creatinine, Ser: 0.58 mg/dL (ref 0.40–1.20)
GFR: 123.06 mL/min (ref >60.00)
Glucose, Bld: 91 mg/dL (ref 70–99)
Potassium: 4.1 meq/L (ref 3.5–5.1)
Sodium: 135 meq/L (ref 135–145)
Total Bilirubin: 0.5 mg/dL (ref 0.2–1.2)
Total Protein: 7.5 g/dL (ref 6.0–8.3)

## 2023-08-12 LAB — CBC WITH DIFFERENTIAL/PLATELET
Basophils Absolute: 0.1 10*3/uL (ref 0.0–0.1)
Basophils Relative: 0.8 % (ref 0.0–3.0)
Eosinophils Absolute: 0.3 10*3/uL (ref 0.0–0.7)
Eosinophils Relative: 3 % (ref 0.0–5.0)
HCT: 42.2 % (ref 36.0–46.0)
Hemoglobin: 14.1 g/dL (ref 12.0–15.0)
Lymphocytes Relative: 28.4 % (ref 12.0–46.0)
Lymphs Abs: 2.4 10*3/uL (ref 0.7–4.0)
MCHC: 33.6 g/dL (ref 30.0–36.0)
MCV: 83.6 fl (ref 78.0–100.0)
Monocytes Absolute: 0.5 10*3/uL (ref 0.1–1.0)
Monocytes Relative: 6.1 % (ref 3.0–12.0)
Neutro Abs: 5.2 10*3/uL (ref 1.4–7.7)
Neutrophils Relative %: 61.7 % (ref 43.0–77.0)
Platelets: 374 10*3/uL (ref 150.0–400.0)
RBC: 5.04 Mil/uL (ref 3.87–5.11)
RDW: 14.8 % (ref 11.5–15.5)
WBC: 8.4 10*3/uL (ref 4.0–10.5)

## 2023-08-12 LAB — C-REACTIVE PROTEIN: CRP: <1 mg/dL (ref 0.5–20.0)

## 2023-08-12 LAB — SEDIMENTATION RATE: Sed Rate: 24 mm/h — ABNORMAL HIGH (ref 0–20)

## 2023-08-12 MED ORDER — OXYCODONE-ACETAMINOPHEN 5-325 MG PO TABS: 1.0000 | ORAL_TABLET | Freq: Four times a day (QID) | ORAL | 0 refills | Status: DC | PRN

## 2023-08-12 MED ORDER — HYDROCORTISONE ACETATE 25 MG RE SUPP: 25.0000 mg | Freq: Two times a day (BID) | RECTAL | 1 refills | Status: DC

## 2023-08-12 MED ORDER — PROMETHAZINE HCL 25 MG PO TABS: 25.0000 mg | ORAL_TABLET | Freq: Four times a day (QID) | ORAL | 1 refills | Status: AC | PRN

## 2023-08-12 MED ORDER — PREDNISONE 10 MG PO TABS: ORAL_TABLET | ORAL | 0 refills | Status: AC

## 2023-08-12 NOTE — Telephone Encounter (Signed)
 Called patient to discuss my chart message. Patient reports that for the last 3 weeks, she has been having severe, constant pain. It feels like my intestines are on fire. Reports BRBPR and dark blood. Also reports mucus with stools. Denies fevers, has had nausea but denies vomiting. Patient scheduled for 11:00 AM with Delon.

## 2023-08-12 NOTE — Addendum Note (Signed)
 Addended by: BEATHER DELON CROME on: 08/12/2023 03:50 PM   Modules accepted: Level of Service

## 2023-08-12 NOTE — Telephone Encounter (Signed)
 Pt informed of provider recommendation and states understanding. She will contact her PCP in regards to Rx refill.

## 2023-08-12 NOTE — Patient Instructions (Addendum)
 Your provider has requested that you go to the basement level for lab work before leaving today. Press B on the elevator. The lab is located at the first door on the left as you exit the elevator.  We have sent the following medications to your pharmacy for you to pick up at your convenience: Prednisone  taper & Oxycodone  refilled Phenergan  25 mg every 6 hours as needed.  Hydrocortisone  suppository twice daily for 7 days.   _______________________________________________________  If your blood pressure at your visit was 140/90 or greater, please contact your primary care physician to follow up on this.  _______________________________________________________  If you are age 29 or older, your body mass index should be between 23-30. Your Body mass index is 30.61 kg/m. If this is out of the aforementioned range listed, please consider follow up with your Primary Care Provider.  If you are age 80 or younger, your body mass index should be between 19-25. Your Body mass index is 30.61 kg/m. If this is out of the aformentioned range listed, please consider follow up with your Primary Care Provider.   ________________________________________________________  The [facility] providers would like to encourage you to use MYCHART to communicate with providers for non-urgent requests or questions.  Due to long hold times on the telephone, sending your provider a message by [facility] may be a faster and more efficient way to get a response.  Please allow 48 business hours for a response.  Please remember that this is for non-urgent requests.  _______________________________________________________

## 2023-08-12 NOTE — Progress Notes (Signed)
 Chief Complaint: Crohn's disease with flare  HPI:    Marissa Morales is a 28 year old female known to Dr. Legrand, with a past medical history as listed below including Crohn's disease, anxiety, reflux, depression and multiple others, who was referred to me by Lowella Benton CROME, PA for a complaint of chron's disease with flare.    09/14/2022 flex sigmoidoscopy done for a history of Crohn's disease and left lower quadrant pain with stool in the transverse colon, nonbleeding internal hemorrhoids and otherwise normal.    04/14/2023 abdominal x-ray with nonobstructive bowel gas pattern.    04/14/2023 patient seen in clinic by Con Blower, PA.  At that time suspicion for Crohn's flare.  At that time CBC, CMP, sed rate, CRP, C. difficile PCR and GDH antigen, fecal calprotectin.  X-ray for constipation.  X-ray was negative for constipation and give patient given Prednisone .    Today, patient presents to clinic and tells me for the past 3 weeks she has had issues with mucousy bloody stools and a lot of rectal pain.  She also has abdominal pain mostly in the left side of her abdomen and right lower quadrant.  Apparently was experiencing much the same thing back in January and ended up having 2 weeks of Prednisone  for a suspected flare of her Crohn's disease.  Tells me that the pain is so bad she has been needing her Oxycodone  3 out of 7 days of the week.  Also having nausea which seems to be getting worse and feels like she needs to go to the bathroom but just has bloody mucus.  She is on Skyrizi  with her last dosing 2 weeks ago.  Also describes a lot of stress in her life.    Denies fever, chills, weight loss or vomiting.  GI HISTORY ---years of marked colitis of the rectosigmoid junction, difficult to control on multiple meds, failed anti-TNF, failed Entyvio, finally evolved into more of a Crohn's-like picture including mild terminal ileitis.  Started on Skyrizi  and had complete symptomatic and mucosal remission  on last colonoscopy January 2024.  Was doing well at clinic follow-up April 2024. --History of anal fissure and C. difficile infection --May 2024 office visit for acute symptoms of probable gastrointestinal infection,, tested negative for C. difficile. 15 mm cecal SSP removed March 2021.  Diminutive transverse colon tubular adenoma September 2022.  No polyps on January 2024 diagnostic colonoscopy, recall for polyp surveillance at January 2027  --Chronic constipation requiring prescription medication, previously normal anorectal manometry. -- 12/24/2022: Seen for apparent flare of symptoms with lower abdominal/pelvic pain and bleeding.  Negative stool studies at this time.  Prednisone  taper started. --- 01/14/2023: Seen again for abdominal/pelvic pain with hematochezia with negative C. difficile testing and treatment with prednisone .  CT without acute changes.  Concern for Crohn's flare led to brief hospitalization in which she underwent flexible sigmoidoscopy with Dr. Federico which showed poor prep/retained stool.  No active Crohn's inflammation in rectosigmoid area no active bleeding.  Thought to be constipation and her Skyrizi  was continued ---- 03/06/2023: video visit with Dr. Legrand and reportedly doing well on Skyrizi  and Amitiza .    Past Medical History:  Diagnosis Date   Allergy     cats   Anemia    Anxiety    panic attacks   Asthma    as a child - dirty cats will cause an asthma attack   Complication of anesthesia    pt said she was told she had an asthma attack during an  upper GI endoscopy and was given a nebulizer treatment.   Crohn's disease (HCC)    Depression    Family history of adverse reaction to anesthesia    pt states her mother wakes up during anesthesia   GERD (gastroesophageal reflux disease)    Hemorrhoids    History of asthma    childhood   History of kidney stones    IBS (irritable bowel syndrome)    Pilonidal cyst    Sleep apnea    Wears glasses     Past  Surgical History:  Procedure Laterality Date   ANAL RECTAL MANOMETRY N/A 07/24/2020   Procedure: ANO RECTAL MANOMETRY;  Surgeon: Legrand Victory LITTIE DOUGLAS, MD;  Location: [facility];  Service: Gastroenterology;  Laterality: N/A;   BIOPSY  01/15/2023   Procedure: BIOPSY;  Surgeon: Federico Rosario BROCKS, MD;  Location: [facility];  Service: Gastroenterology;;   COLONOSCOPY     COLONOSCOPY WITH ESOPHAGOGASTRODUODENOSCOPY (EGD)  x2  last one 2015   FLEXIBLE SIGMOIDOSCOPY N/A 01/15/2023   Procedure: FLEXIBLE SIGMOIDOSCOPY;  Surgeon: Federico Rosario BROCKS, MD;  Location: [facility];  Service: Gastroenterology;  Laterality: N/A;   KNEE ARTHROSCOPY Left 03/30/2020   LIPOMA EXCISION N/A 08/28/2022   Procedure: EXCISION OF SUBCUTANEOUS CYST UPPER BACK;  Surgeon: Signe Mitzie LABOR, MD;  Location: [facility];  Service: General;  Laterality: N/A;   PILONIDAL CYST EXCISION N/A 07/18/2017   Procedure: CYST EXCISION PILONIDAL;  Surgeon: Signe Mitzie LABOR, MD;  Location: [facility];  Service: General;  Laterality: N/A;   TONSILLECTOMY Bilateral 06/13/2023   Procedure: BILATRERAL TONSILLECTOMY;  Surgeon: Carlie Clark, MD;  Location: [facility];  Service: ENT;  Laterality: Bilateral;   UPPER GASTROINTESTINAL ENDOSCOPY     WISDOM TOOTH EXTRACTION  2015    Current Outpatient Medications  Medication Sig Dispense Refill   albuterol  (PROVENTIL ) (2.5 MG/3ML) 0.083% nebulizer solution Take 3 mLs (2.5 mg total) by nebulization every 6 (six) hours as needed for wheezing or shortness of breath. 75 mL 2   Albuterol -Budesonide  (AIRSUPRA ) 90-80 MCG/ACT AERO Inhale 2 puffs into the lungs every 6 (six) hours as needed. 1 g 11   Azelastine -Fluticasone  137-50 MCG/ACT SUSP Place 1 spray into the nose daily. (Patient not taking: Reported on 07/28/2023) 23 g 11   busPIRone  (BUSPAR ) 10 MG tablet Take 1 tablet (10 mg total) by mouth 2 (two) times daily. 180 tablet 1   cetirizine (ZYRTEC) 10 MG tablet Take 10 mg by mouth at bedtime.      clindamycin  (CLINDAGEL) 1 % gel Apply topically 2 (two) times daily. 60 g 2   clobetasol cream (TEMOVATE) 0.05 % Apply 1 Application topically 2 (two) times daily as needed (irritation- affected areas).     diclofenac Sodium (VOLTAREN) 1 % GEL Apply 2-3 g topically 4 (four) times daily as needed (pain).     dicyclomine  (BENTYL ) 10 MG capsule TAKE 1 CAPSULE 2-3 TIMES A DAY AS NEEDED FOR ABDOMINAL CRAMPS (Patient not taking: Reported on 07/28/2023) 60 capsule 1   fluticasone -salmeterol (ADVAIR) 250-50 MCG/ACT AEPB Inhale 1 puff into the lungs in the morning and at bedtime.     Fluticasone -Umeclidin-Vilant (TRELEGY ELLIPTA ) 100-62.5-25 MCG/ACT AEPB Inhale 1 puff into the lungs daily. (Patient not taking: Reported on 06/10/2023) 1 each 11   HYDROcodone -acetaminophen  (HYCET) 7.5-325 mg/15 ml solution Take 15 mLs by mouth every 4 (four) hours as needed for severe pain (pain score 7-10). (Patient not taking: Reported on 07/28/2023) 473 mL 0   hydrocortisone  cream 1 %  Apply to affected area 2 times daily (Patient not taking: Reported on 07/28/2023) 15 g 0   hydrOXYzine (ATARAX) 25 MG tablet Take 25 mg by mouth daily as needed for anxiety (or panic attacks).     lubiprostone  (AMITIZA ) 8 MCG capsule TAKE 1 CAPSULE (8 MCG TOTAL) BY MOUTH 2 (TWO) TIMES DAILY WITH A MEAL. 60 capsule 2   montelukast  (SINGULAIR ) 10 MG tablet Take 1 tablet (10 mg total) by mouth at bedtime. 90 tablet 1   omeprazole  (PRILOSEC) 40 MG capsule Take 1 capsule (40 mg total) by mouth daily. 90 capsule 2   oxyCODONE -acetaminophen  (PERCOCET) 5-325 MG tablet Take 1-2 tablets by mouth every 6 (six) hours as needed for severe pain (pain score 7-10). 20 tablet 0   polyethylene glycol (MIRALAX ) 17 g packet Take 17 g by mouth daily as needed for mild constipation. 14 each 0   Polyethylene Glycol 400 (BLINK TEARS) 0.25 % SOLN Place 1 drop into both eyes daily as needed (dry eye).     Risankizumab -rzaa (SKYRIZI ) 180 MG/1.2ML SOCT Inject 180 mg into the skin  every 8 (eight) weeks. 1.2 mL 5   sertraline  (ZOLOFT ) 50 MG tablet Take 50 mg by mouth in the morning.     spironolactone  (ALDACTONE ) 25 MG tablet Take 1 tablet (25 mg total) by mouth 2 (two) times daily. 180 tablet 1   traZODone  (DESYREL ) 50 MG tablet TAKE 1 TABLET BY MOUTH EVERYDAY AT BEDTIME 90 tablet 3   No current facility-administered medications for this visit.    Allergies as of 08/12/2023 - Review Complete 07/28/2023  Allergen Reaction Noted   Wound dressing adhesive Other (See Comments) 01/14/2023   Bactrim [sulfamethoxazole-trimethoprim] Nausea And Vomiting 01/15/2017   Bismuth subsalicylate Nausea And Vomiting 08/21/2022   Bismuth-containing compounds Nausea And Vomiting 06/15/2019   Cefpodoxime Nausea And Vomiting and Other (See Comments) 01/15/2017   Doxycycline Nausea And Vomiting 01/15/2017   Ibuprofen Other (See Comments) 01/14/2023   Silicone Other (See Comments) 09/18/2022   Vancomycin  Itching and Other (See Comments) 07/18/2017    Family History  Problem Relation Age of Onset   Asthma Mother    Irritable bowel syndrome Mother    Endometriosis Mother    Colon cancer Mother    COPD Mother    Hypertension Father    Colon polyps Father    Endometriosis Sister    Irritable bowel syndrome Sister    Diverticulitis Sister    Heart attack Maternal Grandmother    Breast cancer Paternal Grandfather    Colon cancer Maternal Uncle    Liver cancer Neg Hx    Rectal cancer Neg Hx    Pancreatic cancer Neg Hx    Stomach cancer Neg Hx    Esophageal cancer Neg Hx     Social History   Socioeconomic History   Marital status: Single    Spouse name: Not on file   Number of children: Not on file   Years of education: Not on file   Highest education level: Not on file  Occupational History   Not on file  Tobacco Use   Smoking status: Former    Current packs/day: 0.00    Average packs/day: 0.5 packs/day for 5.0 years (2.5 ttl pk-yrs)    Types: Cigarettes,  E-cigarettes    Start date: 05/15/2012    Quit date: 05/15/2017    Years since quitting: 6.2   Smokeless tobacco: Never   Tobacco comments:    07-15-2017  per pt quit smoking cig. 05-15-2017  but occasionally vapes  Vaping Use   Vaping status: Some Days  Substance and Sexual Activity   Alcohol use: Not Currently    Comment: rarely   Drug use: No   Sexual activity: Yes    Birth control/protection: None    Comment: Last menstral cycle approximately 2 weeks ago Late August.  Pt denies being pregnant.  Other Topics Concern   Not on file  Social History Narrative   Not on file   Social Drivers of Health   Financial Resource Strain: Low Risk  (11/06/2020)   Received from [facility]   Overall Financial Resource Strain (CARDIA)    Difficulty of Paying Living Expenses: Not very hard  Food Insecurity: No Food Insecurity (01/14/2023)   Hunger Vital Sign    Worried About Running Out of Food in the Last Year: Never true    Ran Out of Food in the Last Year: Never true  Transportation Needs: No Transportation Needs (01/14/2023)   PRAPARE - Administrator, Civil Service (Medical): No    Lack of Transportation (Non-Medical): No  Physical Activity: Sufficiently Active (11/06/2020)   Received from [facility]   Exercise Vital Sign    On average, how many days per week do you engage in moderate to strenuous exercise (like a brisk walk)?: 5 days    On average, how many minutes do you engage in exercise at this level?: 60 min  Stress: No Stress Concern Present (11/06/2020)   Received from [facility] - Occupational Stress Questionnaire    Feeling of Stress : Only a little  Social Connections: Unknown (06/17/2021)   Received from [facility]   Social Network    Social Network: Not on file  Intimate Partner Violence: Not At Risk (01/14/2023)   Humiliation, Afraid, Rape, and Kick questionnaire    Fear of Current or Ex-Partner: No     Emotionally Abused: No    Physically Abused: No    Sexually Abused: No    Review of Systems:    Constitutional: No weight loss, fever or chills Cardiovascular: No chest pain Respiratory: No SOB  Gastrointestinal: See HPI and otherwise negative   Physical Exam:  Vital signs: BP 118/68   Pulse 80   Ht 5' 1 (1.549 m)   Wt 162 lb (73.5 kg)   BMI 30.61 kg/m    Constitutional:   Pleasant Caucasian female appears to be in NAD, Well developed, Well nourished, alert and cooperative  Respiratory: Respirations even and unlabored. Lungs clear to auscultation bilaterally.   No wheezes, crackles, or rhonchi.  Cardiovascular: Normal S1, S2. No MRG. Regular rate and rhythm. No peripheral edema, cyanosis or pallor.  Gastrointestinal:  Soft, nondistended, moderate left-sided TTP and right lower quadrant TTP. No rebound or guarding. Normal bowel sounds. No appreciable masses or hepatomegaly. Rectal: External: No abnormality, no fissure; internal: Some tenderness; anoscopy: Inflamed rectal mucosa with some hemorrhoids, tender Psychiatric: Oriented to person, place and time. Demonstrates good judgement and reason without abnormal affect or behaviors.  RELEVANT LABS AND IMAGING: CBC    Component Value Date/Time   WBC 9.2 07/28/2023 1426   RBC 5.26 (H) 07/28/2023 1426   HGB 14.6 07/28/2023 1426   HCT 44.9 07/28/2023 1426   PLT 437.0 (H) 07/28/2023 1426   MCV 85.3 07/28/2023 1426   MCH 27.8 06/13/2023 0951   MCHC 32.6 07/28/2023 1426   RDW 15.3 07/28/2023 1426   LYMPHSABS 2.8 04/14/2023 1450  MONOABS 0.5 04/14/2023 1450   EOSABS 0.2 04/14/2023 1450   BASOSABS 0.1 04/14/2023 1450    CMP     Component Value Date/Time   NA 138 07/28/2023 1426   K 4.2 07/28/2023 1426   CL 101 07/28/2023 1426   CO2 28 07/28/2023 1426   GLUCOSE 89 07/28/2023 1426   BUN 7 07/28/2023 1426   CREATININE 0.60 07/28/2023 1426   CALCIUM 10.2 07/28/2023 1426   PROT 7.7 07/28/2023 1426   ALBUMIN 4.9 07/28/2023  1426   AST 11 07/28/2023 1426   ALT 9 07/28/2023 1426   ALKPHOS 96 07/28/2023 1426   BILITOT 0.5 07/28/2023 1426   GFRNONAA >60 01/15/2023 0454    Assessment: 1.  Crohn's with rectal bleeding: Patient currently on Skyrizi , see HPI for Crohn's history, flare back in January which apparently improved with Prednisone , now again with abdominal pain and rectal bleeding with mucus over the past 3 weeks, requiring Oxycodone  3/7 days a week  Plan: 1.  At this point we will initiate a longer Prednisone  taper.  40 mg q. 7 days then decrease by 5 mg/week. 2.  Will likely need to test drug levels of Skyrizi , but right now she is only 2 weeks out from her last dosing.  Will communicate further with Dr. Legrand. 3.  Prescribed Phenergan  25 mg p.o. every 6 hours for nausea #30 4.  Refilled Oxycodone  every 6 hours for pain #20 5.  Ordered labs including CBC, CMP, ESR, CRP, fecal calprotectin GI pathogen panel 6.  Prescribed Hydrocortisone  suppositories twice daily x 7 days #14 with 1 refill 7.  Patient to follow in clinic per recommendations after labs above.  Delon Failing, PA-C [facility] 08/12/2023, 11:00 AM  Cc: Crain, Whitney L, PA

## 2023-08-13 ENCOUNTER — Encounter: Payer: Self-pay | Admitting: Urgent Care

## 2023-08-13 DIAGNOSIS — G4700 Insomnia, unspecified: Secondary | ICD-10-CM

## 2023-08-13 MED ORDER — TRAZODONE HCL 50 MG PO TABS: 50.0000 mg | ORAL_TABLET | Freq: Every day | ORAL | 3 refills | Status: AC

## 2023-08-14 ENCOUNTER — Other Ambulatory Visit: Payer: Self-pay | Admitting: Urgent Care

## 2023-08-14 ENCOUNTER — Other Ambulatory Visit

## 2023-08-14 ENCOUNTER — Telehealth: Payer: Self-pay | Admitting: Physician Assistant

## 2023-08-14 DIAGNOSIS — K50811 Crohn's disease of both small and large intestine with rectal bleeding: Secondary | ICD-10-CM

## 2023-08-14 DIAGNOSIS — F32A Depression, unspecified: Secondary | ICD-10-CM

## 2023-08-14 MED ORDER — BUSPIRONE HCL 10 MG PO TABS
10.0000 mg | ORAL_TABLET | Freq: Two times a day (BID) | ORAL | 3 refills | Status: AC
Start: 2023-08-14 — End: ?

## 2023-08-14 NOTE — Telephone Encounter (Signed)
 Understood  Calprotectin as soon as possible, please.  VEAR Brand MD

## 2023-08-14 NOTE — Progress Notes (Signed)
 ____________________________________________________________  Attending physician addendum:  Thank you for sending this case to me. I have reviewed the entire note and here my further thoughts on this...  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  She has had similar presentations within the last year including the one leading to a hospitalization in November, and all of that turned out to be related to constipation.  While she was given a prednisone  course shortly before that hospitalization, it did not lead to symptom improvement, and sigmoidoscopy that admission showed no activity.  She has long struggled with constipation and had a previously normal anorectal manometry.  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  So I am concerned we are dealing with the same thing at this point, especially given her reassuring lab work.  Incidentally (since you mentioned in your note) Skyrizi  drug levels are not widely available and the optimal therapeutic levels for them have not yet been established.  My recommendations are that we move away from the opioid analgesic because I think it is likely worsening constipation.  She had significant benefit from a bowel purge around the time of November hospitalization for similar symptoms.  I recommend having her do a MiraLAX  bowel purge (with Dulcolax) and see if we can get relief of constipation and any relief of these symptoms.  If not better with that, and if her fecal calprotectin is elevated, then the next step would most likely a colonoscopy.    - H. Legrand, MD  Victory Legrand, MD  ____________________________________________________________

## 2023-08-14 NOTE — Progress Notes (Signed)
 Rx request from [facility] sent via fax for 10mg  buspirone . Will e-scribe refill.

## 2023-08-14 NOTE — Telephone Encounter (Signed)
 Telephone encounter  08/14/2023  4:15 PM  Called and spoke with the patient in regards to Dr. Clayburn additional recommendations on addendum of my note.  She tells me that she is just not convinced that this is constipation.  This feels different.  She tells me that currently she is taken Amitiza  twice a day and takes MiraLAX  every day and if she does not have 3 bowel movements a day she takes more MiraLAX .  She just really does not agree that that is what is contributing to her pain.  She would rather like to continue the Prednisone  and the opioids.  Discussed that if it is constipation the opioids can make it worse.  For now she wants to stay the course.  Told her I would update Dr. Legrand.  He can call and discuss further if he would like.  She did just return her stool studies so we will await fecal calprotectin.  Delon Failing, PA-C

## 2023-08-19 LAB — GI PROFILE, STOOL, PCR

## 2023-08-19 LAB — CALPROTECTIN, FECAL: Calprotectin, Fecal: 506 ug/g — AB (ref 0–120)

## 2023-08-20 ENCOUNTER — Telehealth: Payer: Self-pay

## 2023-08-20 NOTE — Telephone Encounter (Signed)
 Received CMN, awaiting provider's signature.

## 2023-08-20 NOTE — Telephone Encounter (Signed)
 CMN signed and fax was successful.

## 2023-08-26 ENCOUNTER — Encounter: Payer: Self-pay | Admitting: Gastroenterology

## 2023-08-27 NOTE — Telephone Encounter (Signed)
 See message string with patient regarding her recent Crohn's flare.  The last documentation I see for prior authorization on her Skyrizi  is for the 180 mg dose.  Please confirm this with the patient.  If that is her current dose, then I would like to increase it to 360 mg every 8 weeks.  - H.Danis

## 2023-08-28 ENCOUNTER — Other Ambulatory Visit: Payer: Self-pay

## 2023-08-28 ENCOUNTER — Other Ambulatory Visit (HOSPITAL_COMMUNITY): Payer: Self-pay

## 2023-08-28 ENCOUNTER — Telehealth: Payer: Self-pay

## 2023-08-28 MED ORDER — SKYRIZI 360 MG/2.4ML ~~LOC~~ SOCT
SUBCUTANEOUS | 6 refills | Status: DC
  Filled 2023-08-28 (×2): qty 2.4, 56d supply, fill #0
  Filled 2023-10-21 – 2023-11-04 (×2): qty 2.4, 56d supply, fill #1
  Filled 2023-12-24: qty 2.4, 56d supply, fill #2
  Filled 2024-02-17 – 2024-02-25 (×5): qty 2.4, 56d supply, fill #3

## 2023-08-28 NOTE — Telephone Encounter (Signed)
 Contacted the patient. Explained the PA process for the change of her Skyrizi  dosage.  Prescription routed to [facility] to notify the pharmacy team as per our protocol. Patient is due her next injection 09/15/23.

## 2023-08-28 NOTE — Progress Notes (Signed)
 Specialty Pharmacy Initiation Note   Marissa Morales is a 28 y.o. female who will be followed by the specialty pharmacy service for RxSp Crohn's Disease    Review of administration, indication, effectiveness, safety, potential side effects, storage/disposable, and missed dose instructions occurred today for patient's specialty medication(s) Risankizumab -rzaa (Skyrizi )    This is a dose increase for the patient, who was previously on the 180 mg dose.   Patient/Caregiver did not have any additional questions or concerns.   Patient's therapy is appropriate to: Initiate (this is a dose increase, the patient had no side effects on the lower dose; however, she did not stay in remission)    Goals Addressed             This Visit's Progress    Stabilization of disease       Patient is initiating therapy. Patient will maintain adherence        Will follow up in 12 months.  Patient was encouraged to call if any questions or concerns arise.   Silvano LOISE Dolly Specialty Pharmacist

## 2023-08-28 NOTE — Progress Notes (Signed)
 Specialty Pharmacy Initial Fill Coordination Note  Marissa Morales is a 28 y.o. female contacted today regarding initial fill of specialty medication(s) Risankizumab -rzaa (Skyrizi )   Patient requested Delivery   Delivery date: 09/02/23   Verified address: [address] [place], [place] [address]   Medication will be filled on 09-01-2023.   Patient is aware of $0.00 copayment.

## 2023-08-28 NOTE — Progress Notes (Signed)
 Error

## 2023-08-29 ENCOUNTER — Other Ambulatory Visit (HOSPITAL_COMMUNITY): Payer: Self-pay

## 2023-08-29 NOTE — Progress Notes (Signed)
 Initial fill has been completed in [place].

## 2023-09-01 ENCOUNTER — Other Ambulatory Visit: Payer: Self-pay

## 2023-09-01 ENCOUNTER — Other Ambulatory Visit (HOSPITAL_COMMUNITY): Payer: Self-pay

## 2023-09-03 ENCOUNTER — Ambulatory Visit: Admitting: Family Medicine

## 2023-09-06 ENCOUNTER — Other Ambulatory Visit: Payer: Self-pay | Admitting: Physician Assistant

## 2023-09-11 IMAGING — CT CT ABD-PELV W/ CM
2 of 4 series · 15 of 46 positions shown, 17 images · IV contrast (Omnipaque)
Comparison: CT abdomen dated 11/25/2019

CLINICAL DATA: LEFT lower quadrant abdominal pain. History of
ulcerative colitis. Rectal bleeding.

EXAM:
CT ABDOMEN AND PELVIS WITH CONTRAST
TECHNIQUE: Multidetector CT imaging of the abdomen and pelvis was performed
using the standard protocol following bolus administration of
intravenous contrast.

[Series 2: axial st · axial · 0.98mm/px · z∈[+506,+936]mm · 12 of 94 slices shown, 14 images]
[im 4/94  soft-tissue]
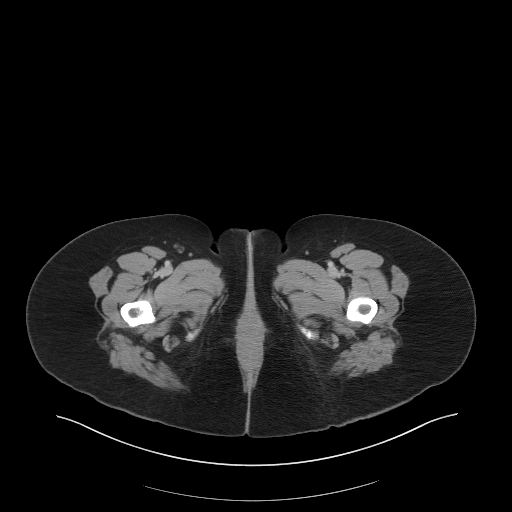
[im 4/94  bone]
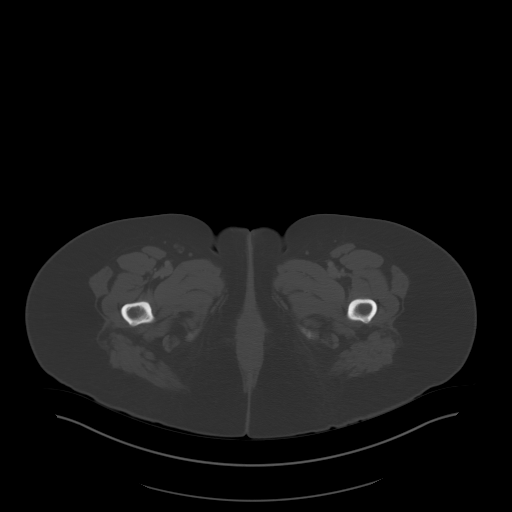
[im 12/94  soft-tissue]
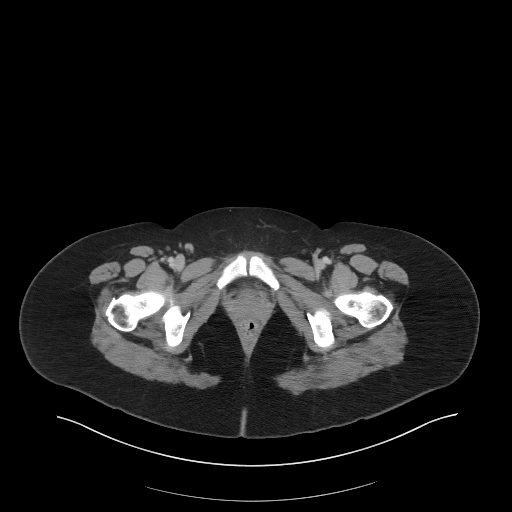
[im 20/94  soft-tissue]
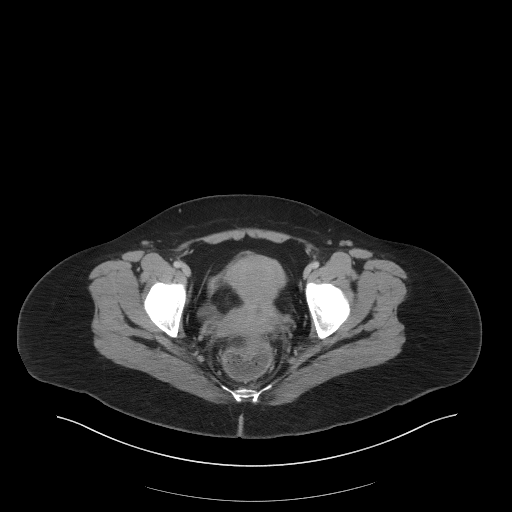
[im 28/94  soft-tissue]
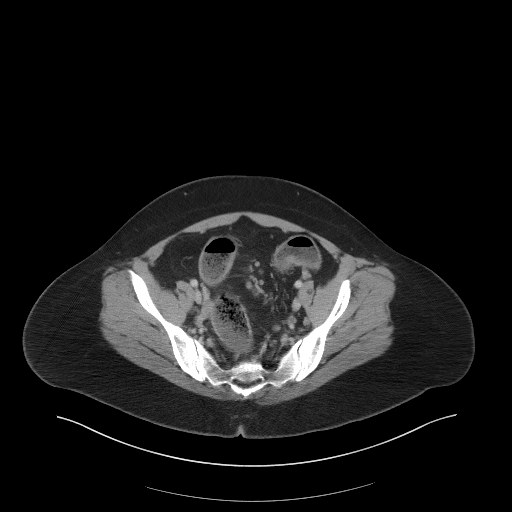
[im 35/94  soft-tissue]
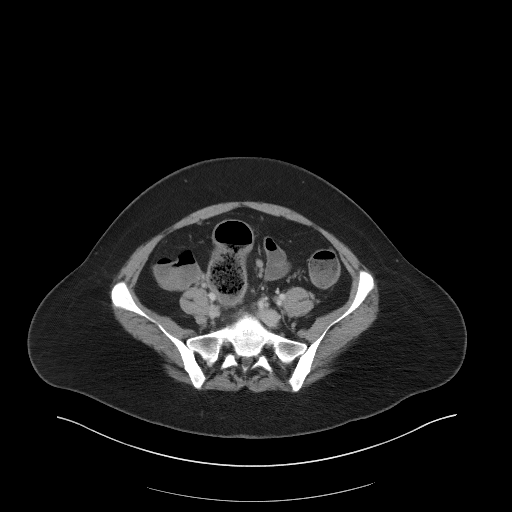
[im 43/94  soft-tissue]
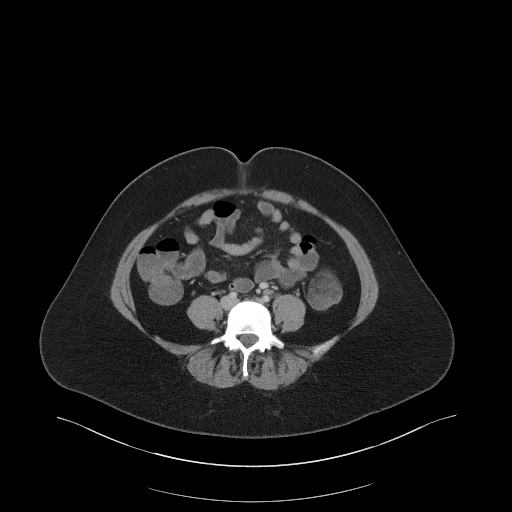
[im 51/94  soft-tissue]
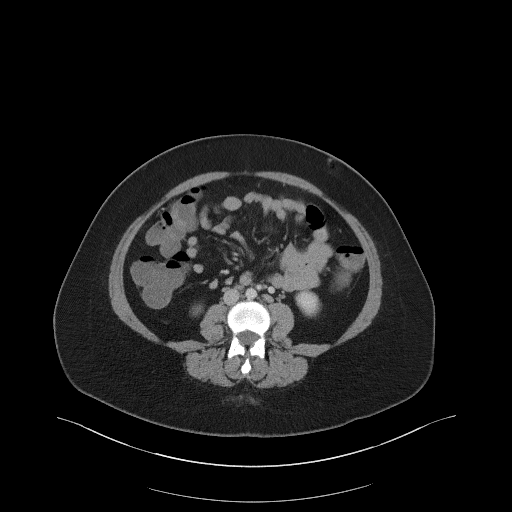
[im 59/94  soft-tissue]
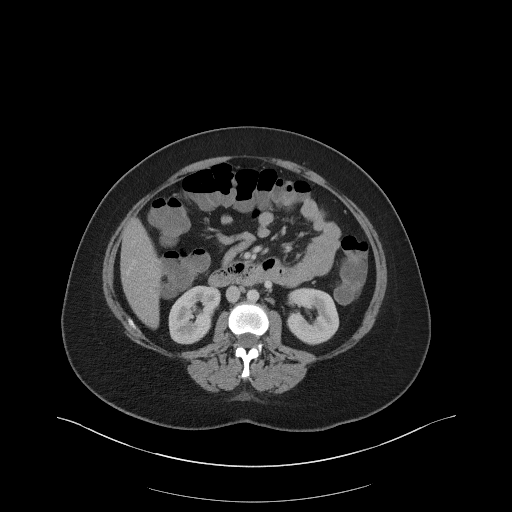
[im 66/94  soft-tissue]
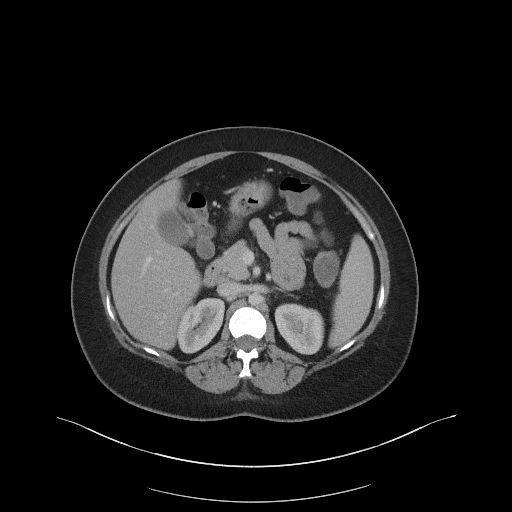
[im 66/94  bone]
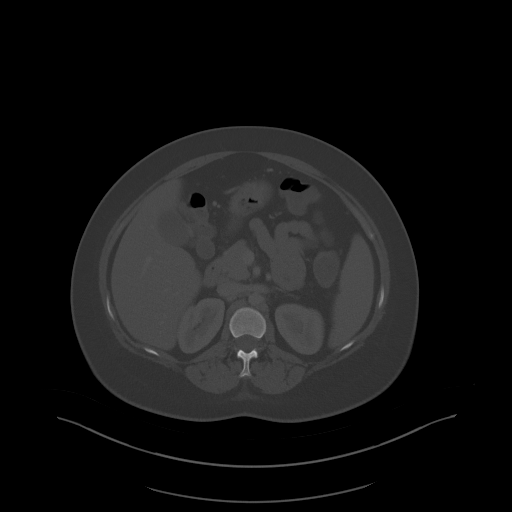
[im 74/94  soft-tissue]
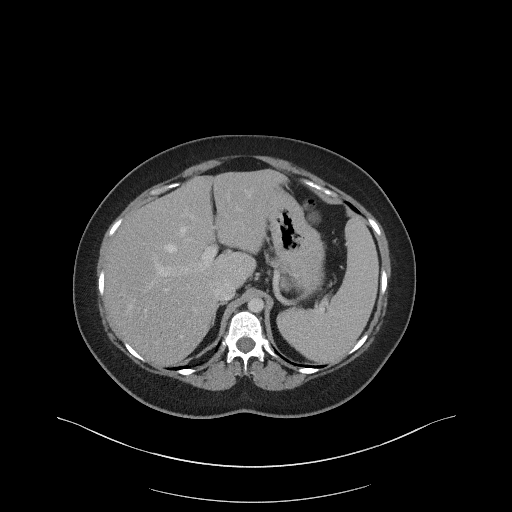
[im 82/94  soft-tissue]
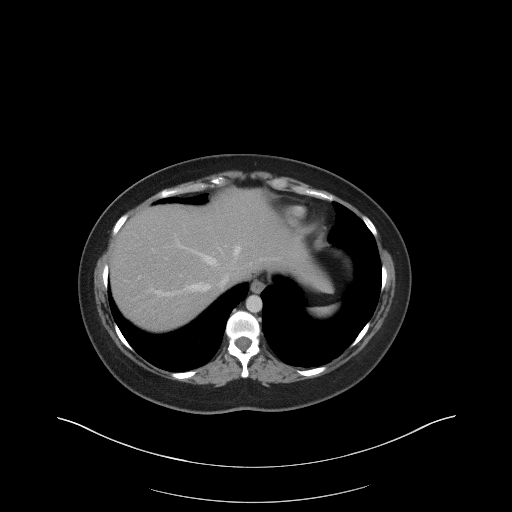
[im 90/94  soft-tissue]
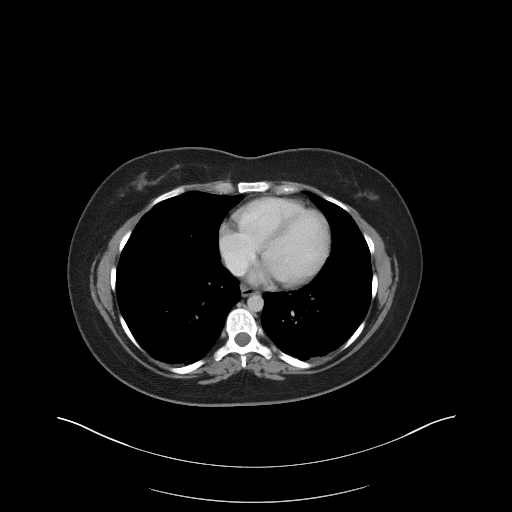

[Series 5: coronal st · coronal · 0.71mm/px · 3 of 103 slices shown]
[im 35/103  soft-tissue]
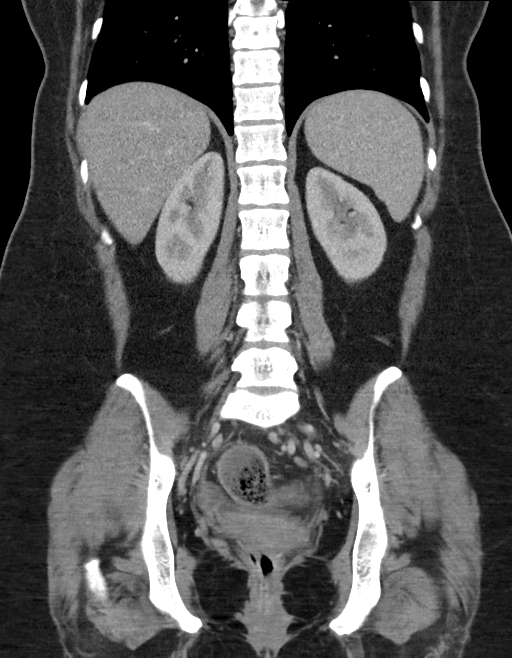
[im 46/103  soft-tissue]
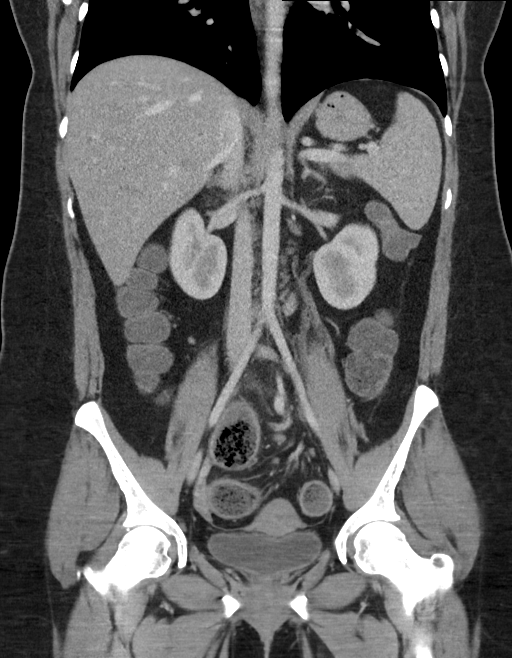
[im 57/103  soft-tissue]
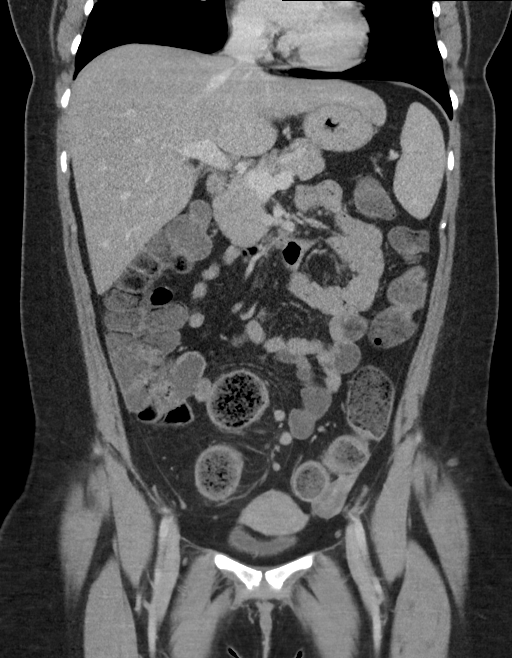

[15 of 46 positions shown; findings below may reference images not displayed]

RADIATION DOSE REDUCTION: This exam was performed according to the
departmental dose-optimization program which includes automated
exposure control, adjustment of the mA and/or kV according to
patient size and/or use of iterative reconstruction technique.

CONTRAST:  100mL OMNIPAQUE IOHEXOL 300 MG/ML  SOLN
FINDINGS: Lower chest: No acute abnormality.

Hepatobiliary: No focal liver abnormality is seen. Gallbladder
appears normal. No bile duct dilatation.

Pancreas: Unremarkable. No pancreatic ductal dilatation or
surrounding inflammatory changes.

Spleen: Normal in size without focal abnormality.

Adrenals/Urinary Tract: Adrenal glands appear normal. Kidneys are
unremarkable without mass, stone or hydronephrosis. No ureteral or
bladder calculi are identified. Bladder is unremarkable, partially
decompressed.

Stomach/Bowel: No dilated large or small bowel loops. Thickening of
the walls of the rectosigmoid colon and lower descending colon, with
pericolonic inflammation/fluid stranding. Fluid, with associated
air-fluid levels, throughout the remainder of the colon. Stomach is
unremarkable.

Vascular/Lymphatic: Numerous small and moderate-sized lymph nodes
within the central pelvic mesentery and LEFT hemipelvis, likely
reactive in nature. Additional mildly prominent lymph nodes within
the periaortic retroperitoneum, also likely reactive in nature.

No vascular abnormality seen.

Reproductive: Uterus and bilateral adnexa are unremarkable.

Other: No abscess collection.  No free intraperitoneal air.

Musculoskeletal: Osseous structures of the abdomen and pelvis are
unremarkable.
IMPRESSION: 1. Thickening of the walls of the rectosigmoid colon and lower
descending colon, with pericolonic inflammation/fluid stranding.
Findings are consistent with the given history of ulcerative
colitis, an acute exacerbation. No abscess collection. No free
intraperitoneal air. No bowel obstruction.
2. Fluid, and associated air-fluid levels throughout the more
proximal colon, compatible with associated ileus.
3. Reactive lymphadenopathy within the pelvis and retroperitoneum.

## 2023-10-17 ENCOUNTER — Other Ambulatory Visit: Payer: Self-pay

## 2023-10-21 ENCOUNTER — Other Ambulatory Visit: Payer: Self-pay

## 2023-10-21 NOTE — Progress Notes (Signed)
 Specialty Pharmacy Refill Coordination Note  Marissa Morales is a 28 y.o. female contacted today regarding refills of specialty medication(s) Risankizumab -rzaa (Skyrizi )   Patient requested Delivery   Delivery date: 11/04/23   Verified address: [address] [place], [place] [address]   Medication will be filled on 11/03/23.

## 2023-10-25 ENCOUNTER — Encounter: Payer: Self-pay | Admitting: Gastroenterology

## 2023-10-27 NOTE — Telephone Encounter (Signed)
 Crohn's patient of mine not doing well.  Please see if she can come for a 4pm clinic visit with me tomorrow.  It would be an add-on and is the only clinic time I have open this week.  - H. Danis

## 2023-10-28 ENCOUNTER — Ambulatory Visit (INDEPENDENT_AMBULATORY_CARE_PROVIDER_SITE_OTHER): Admitting: Gastroenterology

## 2023-10-28 ENCOUNTER — Other Ambulatory Visit (INDEPENDENT_AMBULATORY_CARE_PROVIDER_SITE_OTHER)

## 2023-10-28 ENCOUNTER — Encounter: Payer: Self-pay | Admitting: Gastroenterology

## 2023-10-28 VITALS — BP 100/80 | HR 69 | Ht 61.0 in | Wt 167.2 lb

## 2023-10-28 DIAGNOSIS — K921 Melena: Secondary | ICD-10-CM | POA: Diagnosis not present

## 2023-10-28 DIAGNOSIS — K50811 Crohn's disease of both small and large intestine with rectal bleeding: Secondary | ICD-10-CM

## 2023-10-28 DIAGNOSIS — K5909 Other constipation: Secondary | ICD-10-CM

## 2023-10-28 DIAGNOSIS — Z7960 Long term (current) use of unspecified immunomodulators and immunosuppressants: Secondary | ICD-10-CM

## 2023-10-28 DIAGNOSIS — R1030 Lower abdominal pain, unspecified: Secondary | ICD-10-CM

## 2023-10-28 DIAGNOSIS — R109 Unspecified abdominal pain: Secondary | ICD-10-CM | POA: Diagnosis not present

## 2023-10-28 LAB — CBC WITH DIFFERENTIAL/PLATELET
Basophils Absolute: 0.1 K/uL (ref 0.0–0.1)
Basophils Relative: 0.7 % (ref 0.0–3.0)
Eosinophils Absolute: 0.2 K/uL (ref 0.0–0.7)
Eosinophils Relative: 2.4 % (ref 0.0–5.0)
HCT: 42 % (ref 36.0–46.0)
Hemoglobin: 13.9 g/dL (ref 12.0–15.0)
Lymphocytes Relative: 33.2 % (ref 12.0–46.0)
Lymphs Abs: 3.3 K/uL (ref 0.7–4.0)
MCHC: 33.1 g/dL (ref 30.0–36.0)
MCV: 84 fl (ref 78.0–100.0)
Monocytes Absolute: 0.6 K/uL (ref 0.1–1.0)
Monocytes Relative: 6.2 % (ref 3.0–12.0)
Neutro Abs: 5.7 K/uL (ref 1.4–7.7)
Neutrophils Relative %: 57.5 % (ref 43.0–77.0)
Platelets: 367 K/uL (ref 150.0–400.0)
RBC: 5 Mil/uL (ref 3.87–5.11)
RDW: 14.2 % (ref 11.5–15.5)
WBC: 9.9 K/uL (ref 4.0–10.5)

## 2023-10-28 LAB — SEDIMENTATION RATE: Sed Rate: 17 mm/h (ref 0–20)

## 2023-10-28 MED ORDER — OXYCODONE-ACETAMINOPHEN 5-325 MG PO TABS: 1.0000 | ORAL_TABLET | Freq: Four times a day (QID) | ORAL | 0 refills | Status: DC | PRN

## 2023-10-28 NOTE — Progress Notes (Signed)
 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  Started on Skyrizi  and had complete symptomatic and mucosal remission on last colonoscopy January 2024.  Was doing well at clinic follow-up April 2024. --History of anal fissure and C. difficile infection --May 2024 office visit for acute symptoms of probable gastrointestinal infection,, tested negative for C. difficile. 15 mm cecal SSP removed March 2021.  Diminutive transverse colon tubular adenoma September 2022.  No polyps on January 2024 diagnostic colonoscopy, recall for polyp surveillance at January 2027  --Chronic constipation requiring prescription medication, previously normal anorectal manometry. -- 12/24/2022: Seen for apparent flare of symptoms with lower abdominal/pelvic pain and bleeding.  Negative stool studies at this time.  Prednisone  taper started. --- 01/14/2023: Seen again for abdominal/pelvic pain with hematochezia with negative C. difficile testing and treatment with prednisone .  CT without acute changes.  Concern for Crohn's flare led to brief hospitalization in which she underwent flexible sigmoidoscopy with Dr. Federico which showed poor prep/retained stool.  No active Crohn's inflammation in rectosigmoid area no active bleeding.  Thought to be constipation and her Skyrizi  was continued ---- 03/06/2023: video visit with Dr. Legrand and reportedly doing well on Skyrizi  and Amitiza .   Discussed the use of AI scribe software for clinical note transcription with the patient, who gave verbal consent to proceed.  History of Present Illness Marissa Morales is a 28 year old female with Crohn's disease who presents with worsening abdominal  pain and bleeding. She was seen by Delon in July when her symptoms were not improving.  She has a history of Crohn's disease and is experiencing worsening symptoms. After a higher dose of Skyrizi  (360 mg), she felt better for a couple of weeks, but the improvement was short-lived. In July, she was prescribed prednisone  for eight weeks but only completed six weeks due to limited supply. Initially, prednisone  helped, but symptoms returned when the dose was reduced from 40 mg to 30 mg.  It also gave her headaches, altered sleep and made her gain 10 pounds.  She experiences persistent abdominal pain, rated as a 7 or 8 out of 10, which has worsened over the past two weeks. The pain is significant enough to affect her daily activities.  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   She also reports daily rectal bleeding, which has worsened and occurs several times a day. She experiences a sensation of needing to have a bowel movement, but it is often just blood. Her bowel movements have become irregular, and she is taking Amitiza  and Miralax , though with variable efficacy and still abdominal pain.  She feels extremely tired and cold, suspecting low iron  levels. The last time she felt well was months ago, with symptoms escalating since January. She had a period of relative stability after a cleanse but worsened significantly in July.  Her family history includes endometriosis in her mother and sister, though she has never been diagnosed with it herself. She is currently in nursing school and working full-time, which contributes to her stress levels.    ROS: Cardiovascular:  no chest pain Respiratory: no dyspnea No fever, muscle swelling rash or visual changes/eye redness  The patient's Past Medical, Family and Social History were reviewed  and are on file in the EMR. Past Medical History:  Diagnosis Date   Allergy     cats   Anemia    Anxiety    panic attacks   Asthma    as a  child - dirty cats will cause an asthma attack   Complication of anesthesia    pt said she was told she had an asthma attack during an upper GI endoscopy and was given a nebulizer treatment.   Crohn's disease (HCC)    Depression    Family history of adverse reaction to anesthesia    pt states her mother wakes up during anesthesia   GERD (gastroesophageal reflux disease)    Hemorrhoids    History of asthma    childhood   History of kidney stones    IBS (irritable bowel syndrome)    Pilonidal cyst    Sleep apnea    Wears glasses     Past Surgical History:  Procedure Laterality Date   ANAL RECTAL MANOMETRY N/A 07/24/2020   Procedure: ANO RECTAL MANOMETRY;  Surgeon: Legrand Victory LITTIE DOUGLAS, MD;  Location: [facility];  Service: Gastroenterology;  Laterality: N/A;   BIOPSY  01/15/2023   Procedure: BIOPSY;  Surgeon: Federico Rosario BROCKS, MD;  Location: [facility];  Service: Gastroenterology;;   COLONOSCOPY     COLONOSCOPY WITH ESOPHAGOGASTRODUODENOSCOPY (EGD)  x2  last one 2015   FLEXIBLE SIGMOIDOSCOPY N/A 01/15/2023   Procedure: FLEXIBLE SIGMOIDOSCOPY;  Surgeon: Federico Rosario BROCKS, MD;  Location: [facility];  Service: Gastroenterology;  Laterality: N/A;   KNEE ARTHROSCOPY Left 03/30/2020   LIPOMA EXCISION N/A 08/28/2022   Procedure: EXCISION OF SUBCUTANEOUS CYST UPPER BACK;  Surgeon: Signe Mitzie LABOR, MD;  Location: [facility];  Service: General;  Laterality: N/A;   PILONIDAL CYST EXCISION N/A 07/18/2017   Procedure: CYST EXCISION PILONIDAL;  Surgeon: Signe Mitzie LABOR, MD;  Location: [facility];  Service: General;  Laterality: N/A;   TONSILLECTOMY Bilateral 06/13/2023   Procedure: BILATRERAL TONSILLECTOMY;  Surgeon: Carlie Clark, MD;  Location: [facility];  Service: ENT;  Laterality: Bilateral;   UPPER GASTROINTESTINAL ENDOSCOPY     WISDOM TOOTH EXTRACTION  2015     Objective:  Med list reviewed  Current Outpatient Medications:    albuterol  (PROVENTIL ) (2.5 MG/3ML) 0.083%  nebulizer solution, Take 3 mLs (2.5 mg total) by nebulization every 6 (six) hours as needed for wheezing or shortness of breath., Disp: 75 mL, Rfl: 2   Albuterol -Budesonide  (AIRSUPRA ) 90-80 MCG/ACT AERO, Inhale 2 puffs into the lungs every 6 (six) hours as needed., Disp: 1 g, Rfl: 11   Azelastine -Fluticasone  137-50 MCG/ACT SUSP, Place 1 spray into the nose daily., Disp: 23 g, Rfl: 11   busPIRone  (BUSPAR ) 10 MG tablet, Take 1 tablet (10 mg total) by mouth 2 (two) times daily., Disp: 180 tablet, Rfl: 3   cetirizine (ZYRTEC) 10 MG tablet, Take 10 mg by mouth at bedtime., Disp: , Rfl:    clindamycin  (CLINDAGEL) 1 % gel, Apply topically 2 (two) times daily., Disp: 60 g, Rfl: 2   clobetasol cream (TEMOVATE) 0.05 %, Apply 1 Application topically 2 (two) times daily as needed (irritation- affected areas)., Disp: , Rfl:    diclofenac Sodium (VOLTAREN) 1 % GEL, Apply 2-3 g topically 4 (four) times daily as needed (pain)., Disp: , Rfl:    dicyclomine  (BENTYL ) 10 MG capsule, TAKE 1 CAPSULE 2-3 TIMES A DAY AS NEEDED FOR ABDOMINAL CRAMPS, Disp: 60 capsule, Rfl: 1   fluticasone -salmeterol (ADVAIR) 250-50 MCG/ACT AEPB,  Inhale 1 puff into the lungs in the morning and at bedtime., Disp: , Rfl:    hydrocortisone  (ANUSOL -HC) 25 MG suppository, Place 1 suppository (25 mg total) rectally every 12 (twelve) hours., Disp: 14 suppository, Rfl: 1   hydrocortisone  cream 1 %, Apply to affected area 2 times daily, Disp: 15 g, Rfl: 0   hydrOXYzine (ATARAX) 25 MG tablet, Take 25 mg by mouth daily as needed for anxiety (or panic attacks)., Disp: , Rfl:    lubiprostone  (AMITIZA ) 8 MCG capsule, TAKE 1 CAPSULE (8 MCG TOTAL) BY MOUTH 2 (TWO) TIMES DAILY WITH A MEAL., Disp: 60 capsule, Rfl: 2   montelukast  (SINGULAIR ) 10 MG tablet, Take 1 tablet (10 mg total) by mouth at bedtime., Disp: 90 tablet, Rfl: 1   omeprazole  (PRILOSEC) 40 MG capsule, Take 1 capsule (40 mg total) by mouth daily., Disp: 90 capsule, Rfl: 2   ondansetron  (ZOFRAN ) 4  MG tablet, TAKE 1 TABLET BY MOUTH EVERY 8 HOURS AS NEEDED FOR NAUSEA AND VOMITING, Disp: , Rfl:    oxyCODONE -acetaminophen  (PERCOCET) 5-325 MG tablet, Take 1-2 tablets by mouth every 6 (six) hours as needed for severe pain (pain score 7-10)., Disp: 20 tablet, Rfl: 0   polyethylene glycol (MIRALAX ) 17 g packet, Take 17 g by mouth daily as needed for mild constipation., Disp: 14 each, Rfl: 0   Polyethylene Glycol 400 (BLINK TEARS) 0.25 % SOLN, Place 1 drop into both eyes daily as needed (dry eye)., Disp: , Rfl:    promethazine  (PHENERGAN ) 25 MG tablet, Take 1 tablet (25 mg total) by mouth every 6 (six) hours as needed for nausea or vomiting., Disp: 30 tablet, Rfl: 1   Risankizumab -rzaa (SKYRIZI ) 360 MG/2.4ML SOCT, 360 mg OBI into skin every 8 weeks, Disp: 2.4 mL, Rfl: 6   sertraline  (ZOLOFT ) 50 MG tablet, Take 50 mg by mouth in the morning., Disp: , Rfl:    spironolactone  (ALDACTONE ) 25 MG tablet, Take 1 tablet (25 mg total) by mouth 2 (two) times daily., Disp: 180 tablet, Rfl: 1   traZODone  (DESYREL ) 50 MG tablet, Take 1 tablet (50 mg total) by mouth at bedtime., Disp: 90 tablet, Rfl: 3   Vital signs in last 24 hrs: Vitals:   10/28/23 1603  BP: 100/80  Pulse: 69   Wt Readings from Last 3 Encounters:  10/28/23 167 lb 4 oz (75.9 kg)  08/12/23 162 lb (73.5 kg)  07/28/23 159 lb 12.8 oz (72.5 kg)    Physical Exam  Looks fatigued and unwell but nontoxic.  Pulse normal, well-hydrated HEENT: sclera anicteric, oral mucosa moist without lesions Neck: supple, no thyromegaly, JVD or lymphadenopathy Cardiac: Regular without appreciable murmur,  no peripheral edema Pulm: clear to auscultation bilaterally, normal RR and effort noted Abdomen: soft, bilateral lower tenderness, with active bowel sounds. No guarding or palpable hepatosplenomegaly. Skin; warm and dry, no jaundice or rash   Labs:   ___________________________________________ Radiologic  studies:   ____________________________________________ Other:   _____________________________________________   Encounter Diagnoses  Name Primary?   Crohn's disease of both small and large intestine with rectal bleeding (HCC) Yes   Hematochezia    Lower abdominal pain    Chronic constipation    Long-term use of immunosuppressant medication     Assessment and Plan Assessment & Plan Crohn's disease with active flare It has been difficult at times to assess the disease activity based on her bowel habits and pain since she also has concomitant maternal constipation.  However, the way things have behaved and test  results in recent months this is most likely a Crohn's flare. Persistent severe abdominal pain, bleeding, and altered bowel habits despite prednisone . Skyrizi  questionably effective at this point.  Fatigue likely multifactorial, but she could have developed anemia and iron  deficiency since last evaluated.  Differential also ncludes potential gynecological issues like endometriosis. - Scheduled colonoscopy for next week to assess disease activity and guide treatment.  She was offered some oxycodone  which has worked well for her in the past, and she feels that would be very helpful.  She understands how to be cautious with its use, especially with driving and other activities.   Colonoscopy described in detail along with risks and benefits.  Naturally, she is familiar with this from previous experience.  The benefits and risks of the planned procedure(s) were described in detail with the patient or (when appropriate) their health care proxy.  Risks were outlined as including, but not limited to, bleeding, infection, perforation, adverse medication reaction leading to cardiac or pulmonary decompensation, pancreatitis (if ERCP).  The limitation of incomplete mucosal visualization was also discussed.  No guarantees or warranties were given.  Labs today: CBC, iron  studies, sed rate We  will not have time to do a calprotectin between now and next week given her schedule and challenges getting to the lab.  Plan: Depending on how things look at colonoscopy, we might consider every 4 week Skyrizi .  However, given her minimal improvement on that higher dose for only 2 or 3 weeks after the last injection, not clear how helpful that might be.  It could also be difficult to get insurance approval for that.  This may require change to Rinvoq, and she has become somewhat familiar with that after learning about it from other people in her Crohn's support group.  Victory LITTIE Brand III

## 2023-10-28 NOTE — Patient Instructions (Addendum)
 You have been scheduled for a colonoscopy. Please follow written instructions given to you at your visit today.   If you use inhalers (even only as needed), please bring them with you on the day of your procedure.  DO NOT TAKE 7 DAYS PRIOR TO TEST- Trulicity (dulaglutide) Ozempic, Wegovy (semaglutide) Mounjaro (tirzepatide) Bydureon Bcise (exanatide extended release)  DO NOT TAKE 1 DAY PRIOR TO YOUR TEST Rybelsus (semaglutide) Adlyxin (lixisenatide) Victoza (liraglutide) Byetta (exanatide) ___________________________________________________________________________  Your provider has requested that you go to the basement level for lab work before leaving today. Press B on the elevator. The lab is located at the first door on the left as you exit the elevator.   _______________________________________________________  If your blood pressure at your visit was 140/90 or greater, please contact your primary care physician to follow up on this.  _______________________________________________________  If you are age 19 or older, your body mass index should be between 23-30. Your Body mass index is 31.6 kg/m. If this is out of the aforementioned range listed, please consider follow up with your Primary Care Provider.  If you are age 71 or younger, your body mass index should be between 19-25. Your Body mass index is 31.6 kg/m. If this is out of the aformentioned range listed, please consider follow up with your Primary Care Provider.   ________________________________________________________  The [facility] providers would like to encourage you to use MYCHART to communicate with providers for non-urgent requests or questions.  Due to long hold times on the telephone, sending your provider a message by [facility] may be a faster and more efficient way to get a response.  Please allow 48 business hours for a response.  Please remember that this is for non-urgent requests.   _______________________________________________________  [facility] is using a team-based approach to care.  Your team is made up of your doctor and two to three APPS. Our APPS (Nurse Practitioners and Physician Assistants) work with your physician to ensure care continuity for you. They are fully qualified to address your health concerns and develop a treatment plan. They communicate directly with your gastroenterologist to care for you. Seeing the Advanced Practice Practitioners on your physician's team can help you by facilitating care more promptly, often allowing for earlier appointments, access to diagnostic testing, procedures, and other specialty referrals.   Thank you for trusting me with your gastrointestinal care!    Dr. Victory Legrand DOUGLAS [facility]

## 2023-10-29 ENCOUNTER — Ambulatory Visit: Payer: Self-pay | Admitting: Gastroenterology

## 2023-10-29 LAB — IBC + FERRITIN
Ferritin: 37.9 ng/mL (ref 10.0–291.0)
Iron: 42 ug/dL (ref 42–145)
Saturation Ratios: 10.3 % — ABNORMAL LOW (ref 20.0–50.0)
TIBC: 407.4 ug/dL (ref 250.0–450.0)
Transferrin: 291 mg/dL (ref 212.0–360.0)

## 2023-11-03 ENCOUNTER — Other Ambulatory Visit: Payer: Self-pay

## 2023-11-03 NOTE — Progress Notes (Signed)
 9/15 RS: Called patient to request payment; patient request to cancel refill for Skyrizi  after she have her procedure done.   Patient is unsure if she is going to continue with therapy. Patient will callback to let Spec know if she will continue or not.   Request RTS at central.

## 2023-11-04 ENCOUNTER — Encounter: Payer: Self-pay | Admitting: Gastroenterology

## 2023-11-04 ENCOUNTER — Other Ambulatory Visit: Payer: Self-pay

## 2023-11-04 ENCOUNTER — Ambulatory Visit (AMBULATORY_SURGERY_CENTER): Admitting: Gastroenterology

## 2023-11-04 VITALS — BP 109/64 | HR 55 | Temp 97.7°F | Resp 18 | Ht 61.0 in | Wt 167.0 lb

## 2023-11-04 DIAGNOSIS — K921 Melena: Secondary | ICD-10-CM

## 2023-11-04 DIAGNOSIS — K50811 Crohn's disease of both small and large intestine with rectal bleeding: Secondary | ICD-10-CM

## 2023-11-04 DIAGNOSIS — K6289 Other specified diseases of anus and rectum: Secondary | ICD-10-CM | POA: Diagnosis not present

## 2023-11-04 DIAGNOSIS — K625 Hemorrhage of anus and rectum: Secondary | ICD-10-CM

## 2023-11-04 DIAGNOSIS — K635 Polyp of colon: Secondary | ICD-10-CM | POA: Diagnosis not present

## 2023-11-04 DIAGNOSIS — K5909 Other constipation: Secondary | ICD-10-CM

## 2023-11-04 DIAGNOSIS — D122 Benign neoplasm of ascending colon: Secondary | ICD-10-CM

## 2023-11-04 DIAGNOSIS — R1030 Lower abdominal pain, unspecified: Secondary | ICD-10-CM

## 2023-11-04 MED ORDER — SODIUM CHLORIDE 0.9 % IV SOLN: 500.0000 mL | Freq: Once | INTRAVENOUS | Status: AC

## 2023-11-04 NOTE — Patient Instructions (Signed)
 Discharge instructions given. Handout on polyps. Resume previous medications. YOU HAD AN ENDOSCOPIC PROCEDURE TODAY AT THE [facility]:   Refer to the procedure report that was given to you for any specific questions about what was found during the examination.  If the procedure report does not answer your questions, please call your gastroenterologist to clarify.  If you requested that your care partner not be given the details of your procedure findings, then the procedure report has been included in a sealed envelope for you to review at your convenience later.  YOU SHOULD EXPECT: Some feelings of bloating in the abdomen. Passage of more gas than usual.  Walking can help get rid of the air that was put into your GI tract during the procedure and reduce the bloating. If you had a lower endoscopy (such as a colonoscopy or flexible sigmoidoscopy) you may notice spotting of blood in your stool or on the toilet paper. If you underwent a bowel prep for your procedure, you may not have a normal bowel movement for a few days.  Please Note:  You might notice some irritation and congestion in your nose or some drainage.  This is from the oxygen used during your procedure.  There is no need for concern and it should clear up in a day or so.  SYMPTOMS TO REPORT IMMEDIATELY:  Following lower endoscopy (colonoscopy or flexible sigmoidoscopy):  Excessive amounts of blood in the stool  Significant tenderness or worsening of abdominal pains  Swelling of the abdomen that is new, acute  Fever of 100F or higher   For urgent or emergent issues, a gastroenterologist can be reached at any hour by calling [phone]. Do not use MyChart messaging for urgent concerns.    DIET:  We do recommend a small meal at first, but then you may proceed to your regular diet.  Drink plenty of fluids but you should avoid alcoholic beverages for 24 hours.  ACTIVITY:  You should plan to take it easy for the rest  of today and you should NOT DRIVE or use heavy machinery until tomorrow (because of the sedation medicines used during the test).    FOLLOW UP: Our staff will call the number listed on your records the next business day following your procedure.  We will call around 7:15- 8:00 am to check on you and address any questions or concerns that you may have regarding the information given to you following your procedure. If we do not reach you, we will leave a message.     If any biopsies were taken you will be contacted by phone or by letter within the next 1-3 weeks.  Please call us at [phone] if you have not heard about the biopsies in 3 weeks.    SIGNATURES/CONFIDENTIALITY: You and/or your care partner have signed paperwork which will be entered into your electronic medical record.  These signatures attest to the fact that that the information above on your After Visit Summary has been reviewed and is understood.  Full responsibility of the confidentiality of this discharge information lies with you and/or your care-partner.

## 2023-11-04 NOTE — Progress Notes (Signed)
 No significant changes to clinical history since GI office visit on 10/28/23.  The patient is appropriate for an endoscopic procedure in the ambulatory setting.  - Victory Brand, MD

## 2023-11-04 NOTE — Progress Notes (Signed)
 Called to room to assist during endoscopic procedure.  Patient ID and intended procedure confirmed with present staff. Received instructions for my participation in the procedure from the performing physician.

## 2023-11-04 NOTE — Progress Notes (Signed)
 Patient called back to schedule delivery as she will continue on the medication. New fill date is 9/17 with delivery on 9/18. Confirmed that address on file is correct.

## 2023-11-04 NOTE — Progress Notes (Signed)
 Pt's states no medical or surgical changes since previsit or office visit.

## 2023-11-04 NOTE — Progress Notes (Signed)
 Transferred to PACU via stretcher, arousing, VSS.

## 2023-11-04 NOTE — Op Note (Signed)
 [facility] Patient Name: Marissa Morales Procedure Date: 11/04/2023 8:37 AM MRN: [id number] Endoscopist: Victory L. Legrand , MD, [id number] Age: 28 Referring MD:  Date of Birth: Sep 18, 1995 Gender: Female Account #: [id number] Procedure:                Colonoscopy Indications:              Lower abdominal pain, Rectal bleeding, Disease                            activity assessment of Crohn's disease of the small                            bowel and colon, Constipation                           See office note of 10/28/2023 for clinical details Medicines:                Monitored Anesthesia Care Procedure:                Pre-Anesthesia Assessment:                           - Prior to the procedure, a History and Physical                            was performed, and patient medications and                            allergies were reviewed. The patient's tolerance of                            previous anesthesia was also reviewed. The risks                            and benefits of the procedure and the sedation                            options and risks were discussed with the patient.                            All questions were answered, and informed consent                            was obtained. Prior Anticoagulants: The patient has                            taken no anticoagulant or antiplatelet agents. ASA                            Grade Assessment: II - A patient with mild systemic                            disease. After reviewing the risks and benefits,  the patient was deemed in satisfactory condition to                            undergo the procedure.                           After obtaining informed consent, the colonoscope                            was passed under direct vision. Throughout the                            procedure, the patient's blood pressure, pulse, and                            oxygen saturations were  monitored continuously. The                            Olympus Scope [id number] was introduced through the                            anus and advanced to the the terminal ileum, with                            identification of the appendiceal orifice and IC                            valve. The colonoscopy was performed without                            difficulty. The patient tolerated the procedure                            well. The quality of the bowel preparation was                            good. The terminal ileum, ileocecal valve,                            appendiceal orifice, and rectum were photographed.                            The bowel preparation used was Miralax . Scope In: 8:50:17 AM Scope Out: 9:05:51 AM Scope Withdrawal Time: 0 hours 13 minutes 14 seconds  Total Procedure Duration: 0 hours 15 minutes 34 seconds  Findings:                 The perianal and digital rectal examinations were                            normal. (No fissure)                           The terminal ileum appeared normal. (Intubated for  approximately 10 cm)                           A 6 mm polyp was found in the proximal ascending                            colon. The polyp was flat and mucous-capped. The                            polyp was removed with a cold snare. Resection and                            retrieval were complete. Verification of patient                            identification for the specimen was done.                           Localized mild inflammation characterized by                            congestion (edema), erythema, friability and                            granularity was found in the distal rectum                            (posterior aspect of most distal rectal fold).                            Biopsies were taken with a cold forceps for                            histology. Remainder of the colonic and rectal                             mucosa including the rectosigmoid area for this                            patient's Crohn's colitis has previously been                            active was normal.                           The exam was otherwise without abnormality on                            direct and retroflexion views. Complications:            No immediate complications. Estimated Blood Loss:     Estimated blood loss was minimal. Impression:               - The examined portion of the ileum was normal.                           -  One 6 mm polyp in the proximal ascending colon,                            removed with a cold snare. Resected and retrieved.                           - Localized mild inflammation was found in the                            distal rectum secondary to proctitis. Biopsied.                           - The examination was otherwise normal on direct                            and retroflexion views.                           While the focal area of distal rectal inflammation                            could be IBD related, it is in a different location                            and considerably less active than this patient's                            usual Crohn's colitis activity of the rectosigmoid                            junction. There is also no active ileitis.                           It is possible this inflammation may be related to                            the patient's chronic constipation (question focal                            prolapse). While it appears to explain the                            bleeding, it does not clearly explain the degree of                            lower abdominal/pelvic pain this patient has been                            experiencing for many months. In addition, there                            was minimal improvement with a course of prednisone   within the last few months. (See last week's office                             note for clinical details)                           This raises the suspicion of a gynecologic/pelvic                            cause of this pain. Recommendation:           - Patient has a contact number available for                            emergencies. The signs and symptoms of potential                            delayed complications were discussed with the                            patient. Return to normal activities tomorrow.                            Written discharge instructions were provided to the                            patient.                           - Resume previous diet.                           - Continue present medications. Continue Skyrizi  at                            current dose. No change in IBD treatment at this                            time.                           - Await pathology results.                           - Repeat colonoscopy is recommended for                            surveillance. The colonoscopy date will be                            determined after pathology results from today's                            exam become available for review.                           -  Recommend consultation with patient's                            gynecologist, as we must consider conditions like                            endometriosis. Benson Porcaro L. Legrand, MD 11/04/2023 9:20:09 AM This report has been signed electronically.

## 2023-11-05 ENCOUNTER — Telehealth: Payer: Self-pay | Admitting: *Deleted

## 2023-11-05 NOTE — Telephone Encounter (Signed)
 No answer after post call. Unable to leave a message. Mailbox full.

## 2023-11-06 ENCOUNTER — Other Ambulatory Visit: Payer: Self-pay | Admitting: Gastroenterology

## 2023-11-06 ENCOUNTER — Ambulatory Visit: Payer: Self-pay | Admitting: Gastroenterology

## 2023-11-06 DIAGNOSIS — R1030 Lower abdominal pain, unspecified: Secondary | ICD-10-CM

## 2023-11-06 DIAGNOSIS — R102 Pelvic and perineal pain: Secondary | ICD-10-CM

## 2023-11-06 LAB — SURGICAL PATHOLOGY

## 2023-11-06 MED ORDER — MESALAMINE 1000 MG RE SUPP: 1000.0000 mg | Freq: Every day | RECTAL | 1 refills | Status: DC

## 2023-11-24 ENCOUNTER — Encounter: Payer: Self-pay | Admitting: Gastroenterology

## 2023-11-25 MED ORDER — OMEPRAZOLE 40 MG PO CPDR: 40.0000 mg | DELAYED_RELEASE_CAPSULE | Freq: Every day | ORAL | 2 refills | Status: DC

## 2023-11-25 MED ORDER — OMEPRAZOLE 40 MG PO CPDR: 40.0000 mg | DELAYED_RELEASE_CAPSULE | Freq: Every day | ORAL | 2 refills | Status: AC

## 2023-11-29 ENCOUNTER — Other Ambulatory Visit: Payer: Self-pay | Admitting: Gastroenterology

## 2023-12-02 ENCOUNTER — Ambulatory Visit (INDEPENDENT_AMBULATORY_CARE_PROVIDER_SITE_OTHER): Payer: Self-pay | Admitting: Nurse Practitioner

## 2023-12-02 ENCOUNTER — Encounter: Payer: Self-pay | Admitting: Nurse Practitioner

## 2023-12-02 VITALS — BP 112/68 | HR 65 | Temp 97.7°F | Wt 166.0 lb

## 2023-12-02 DIAGNOSIS — N809 Endometriosis, unspecified: Secondary | ICD-10-CM | POA: Diagnosis not present

## 2023-12-02 DIAGNOSIS — N946 Dysmenorrhea, unspecified: Secondary | ICD-10-CM | POA: Diagnosis not present

## 2023-12-02 DIAGNOSIS — R102 Pelvic and perineal pain unspecified side: Secondary | ICD-10-CM | POA: Diagnosis not present

## 2023-12-02 DIAGNOSIS — N926 Irregular menstruation, unspecified: Secondary | ICD-10-CM | POA: Diagnosis not present

## 2023-12-02 MED ORDER — NORETHINDRONE 0.35 MG PO TABS: 1.0000 | ORAL_TABLET | Freq: Every day | ORAL | 1 refills | Status: DC
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
## 2023-12-06 LAB — FOLLICLE STIMULATING HORMONE: FSH: 5.2 m[IU]/mL

## 2023-12-06 LAB — TESTOS,TOTAL,FREE AND SHBG (FEMALE)
Free Testosterone: 3 pg/mL (ref 0.1–6.4)
Sex Hormone Binding: 51 nmol/L (ref 17–124)
Testosterone, Total, LC-MS-MS: 39 ng/dL (ref 2–45)

## 2023-12-06 LAB — ESTRADIOL: Estradiol: 130 pg/mL

## 2023-12-06 LAB — TSH: TSH: 1.07 m[IU]/L

## 2023-12-06 LAB — LUTEINIZING HORMONE: LH: 10.8 m[IU]/mL

## 2023-12-09 ENCOUNTER — Ambulatory Visit: Payer: Self-pay | Admitting: Nurse Practitioner

## 2023-12-16 ENCOUNTER — Ambulatory Visit (INDEPENDENT_AMBULATORY_CARE_PROVIDER_SITE_OTHER)

## 2023-12-16 ENCOUNTER — Encounter: Payer: Self-pay | Admitting: Nurse Practitioner

## 2023-12-16 ENCOUNTER — Ambulatory Visit: Admitting: Nurse Practitioner

## 2023-12-16 VITALS — BP 118/78 | HR 72 | Ht 61.0 in | Wt 164.0 lb

## 2023-12-16 DIAGNOSIS — R102 Pelvic and perineal pain unspecified side: Secondary | ICD-10-CM

## 2023-12-16 DIAGNOSIS — N946 Dysmenorrhea, unspecified: Secondary | ICD-10-CM | POA: Diagnosis not present

## 2023-12-16 DIAGNOSIS — N809 Endometriosis, unspecified: Secondary | ICD-10-CM

## 2023-12-16 DIAGNOSIS — N926 Irregular menstruation, unspecified: Secondary | ICD-10-CM
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
## 2023-12-24 ENCOUNTER — Other Ambulatory Visit (HOSPITAL_COMMUNITY): Payer: Self-pay

## 2023-12-26 ENCOUNTER — Other Ambulatory Visit: Payer: Self-pay

## 2023-12-26 NOTE — Progress Notes (Signed)
 Specialty Pharmacy Refill Coordination Note  Marissa Morales is a 28 y.o. female contacted today regarding refills of specialty medication(s) Risankizumab -rzaa (Skyrizi )   Patient requested Delivery   Delivery date: 12/31/23   Verified address: [address] [place], [place] [address]   Medication will be filled on: 12/30/23

## 2023-12-30 ENCOUNTER — Other Ambulatory Visit: Payer: Self-pay

## 2024-01-20 ENCOUNTER — Ambulatory Visit: Admitting: Obstetrics and Gynecology

## 2024-01-20 VITALS — BP 130/86 | HR 72 | Ht 61.0 in | Wt 165.0 lb

## 2024-01-20 DIAGNOSIS — N809 Endometriosis, unspecified: Secondary | ICD-10-CM | POA: Diagnosis not present

## 2024-01-20 DIAGNOSIS — N921 Excessive and frequent menstruation with irregular cycle: Secondary | ICD-10-CM

## 2024-01-20 DIAGNOSIS — R300 Dysuria: Secondary | ICD-10-CM

## 2024-01-20 DIAGNOSIS — N946 Dysmenorrhea, unspecified: Secondary | ICD-10-CM

## 2024-01-20 LAB — URINALYSIS
Bilirubin Urine: NEGATIVE
Glucose, UA: NEGATIVE
Hgb urine dipstick: NEGATIVE
Ketones, ur: NEGATIVE
Leukocytes,Ua: NEGATIVE
Nitrite: POSITIVE — AB
Protein, ur: NEGATIVE
Specific Gravity, Urine: 1.02 (ref 1.001–1.035)
pH: 7 (ref 5.0–8.0)
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
## 2024-01-21 ENCOUNTER — Ambulatory Visit: Payer: Self-pay | Admitting: Obstetrics and Gynecology

## 2024-01-21 MED ORDER — FLUCONAZOLE 150 MG PO TABS: 150.0000 mg | ORAL_TABLET | Freq: Once | ORAL | 1 refills | Status: AC

## 2024-01-21 MED ORDER — NITROFURANTOIN MONOHYD MACRO 100 MG PO CAPS: 100.0000 mg | ORAL_CAPSULE | Freq: Two times a day (BID) | ORAL | 0 refills | Status: AC

## 2024-01-22 LAB — URINE CULTURE
MICRO NUMBER:: 17302274
SPECIMEN QUALITY:: ADEQUATE

## 2024-01-27 ENCOUNTER — Encounter: Payer: Self-pay | Admitting: Obstetrics and Gynecology

## 2024-01-27 NOTE — Telephone Encounter (Signed)
 See surgery referral.   Encounter closed.

## 2024-01-28 ENCOUNTER — Other Ambulatory Visit: Payer: Self-pay | Admitting: Obstetrics and Gynecology

## 2024-01-28 ENCOUNTER — Other Ambulatory Visit: Payer: Self-pay | Admitting: Gastroenterology

## 2024-01-28 MED ORDER — AMPICILLIN 500 MG PO CAPS: 500.0000 mg | ORAL_CAPSULE | Freq: Two times a day (BID) | ORAL | 0 refills | Status: AC

## 2024-01-28 NOTE — Telephone Encounter (Signed)
 Routing to Dr. Glennon to advise.   Patient is scheduled for surgery 02/10/24 and pre-op on 02/03/24

## 2024-01-29 ENCOUNTER — Other Ambulatory Visit: Payer: Self-pay | Admitting: Urgent Care

## 2024-01-29 DIAGNOSIS — F32A Depression, unspecified: Secondary | ICD-10-CM

## 2024-01-29 MED ORDER — BUSPIRONE HCL 10 MG PO TABS: 10.0000 mg | ORAL_TABLET | Freq: Two times a day (BID) | ORAL | 3 refills | Status: AC

## 2024-01-29 NOTE — Progress Notes (Signed)
 Refill request received from [facility] for buspar . Sent in

## 2024-02-02 ENCOUNTER — Encounter (HOSPITAL_COMMUNITY): Payer: Self-pay | Admitting: Obstetrics and Gynecology

## 2024-02-02 ENCOUNTER — Encounter: Payer: Self-pay | Admitting: *Deleted

## 2024-02-02 NOTE — Progress Notes (Signed)
 Spoke w/ via phone for pre-op interview---[facility] needs dos---- UPT per anesthesia. Surgeon orders requested 02/02/24.        Lab results------ COVID test -----patient states asymptomatic no test needed Arrive at -------0730 NPO after MN NO Solid Food.   Pre-Surgery Ensure or G2:  Med rec completed Medications to take morning of surgery -----Bring Albuterol  inhaler. Airsupra , Buspar , Advair, Prilosec and Zoloft .  Diabetic medication -----  GLP1 agonist last dose: GLP1 instructions:  Patient instructed no nail polish to be worn day of surgery Patient instructed to bring photo id and insurance card day of surgery Patient aware to have Driver (ride ) / caregiver    for 24 hours after surgery - Partner Arcelia Collet Patient Special Instructions ----- Pre-Op special Instructions -----  Patient verbalized understanding of instructions that were given at this phone interview. Patient denies chest pain, sob, fever, cough at the interview.

## 2024-02-03 ENCOUNTER — Encounter: Payer: Self-pay | Admitting: Obstetrics and Gynecology

## 2024-02-03 ENCOUNTER — Ambulatory Visit: Admitting: Obstetrics and Gynecology

## 2024-02-03 VITALS — BP 124/64 | HR 71 | Ht 61.0 in | Wt 165.0 lb

## 2024-02-03 DIAGNOSIS — N946 Dysmenorrhea, unspecified: Secondary | ICD-10-CM | POA: Diagnosis not present

## 2024-02-03 DIAGNOSIS — Z01818 Encounter for other preprocedural examination: Secondary | ICD-10-CM

## 2024-02-03 DIAGNOSIS — N809 Endometriosis, unspecified: Secondary | ICD-10-CM | POA: Diagnosis not present

## 2024-02-03 DIAGNOSIS — N921 Excessive and frequent menstruation with irregular cycle: Secondary | ICD-10-CM | POA: Diagnosis not present

## 2024-02-03 MED ORDER — ACETAMINOPHEN 500 MG PO TABS: 500.0000 mg | ORAL_TABLET | Freq: Four times a day (QID) | ORAL | 0 refills | Status: DC | PRN

## 2024-02-03 MED ORDER — METOCLOPRAMIDE HCL 10 MG PO TABS: 10.0000 mg | ORAL_TABLET | Freq: Three times a day (TID) | ORAL | 0 refills | Status: AC | PRN

## 2024-02-03 MED ORDER — OXYCODONE HCL 5 MG PO TABS: 5.0000 mg | ORAL_TABLET | ORAL | 0 refills | Status: DC | PRN
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
## 2024-02-03 NOTE — Patient Instructions (Signed)
 Robotic Laparoscopic Hysterectomy, Care After  The following information offers guidance on how to care for yourself after your procedure. Your health care provider may also give you more specific instructions. If you have problems or questions, contact your health care provider. What can I expect after the procedure? After the procedure, it is common to have: Pain, bruising, and numbness around your incisions. Tiredness (fatigue).  Abdominal bloating Poor appetite. Chest discomfort that radiates to your shoulder from the carbon dioxide gas for a few days after  Vaginal discharge or spotting. You will need to use a sanitary pad after this procedure.  HEAVY BLEEDING LIKE A PERIOD IS NOT NORMAL.  PLEASE CALL YOUR PROVIDER IF SOAKING A PAD or have copious discharge and or pain.  Feelings of sadness or other emotions.  Medicines Take over-the-counter and prescription medicines only as told by your health care provider. Ask your health care provider if the medicine prescribed to you: Requires you to avoid driving or using machinery. You cannot drive for 24 hours after anesthesia Can cause constipation. You may need to take these actions to prevent or treat constipation: Drink enough fluid to keep your urine pale yellow. Take over-the-counter or prescription medicines. Eat foods that are high in fiber, such as beans, whole grains, and fresh fruits and vegetables. Limit foods that are high in fat and processed sugars, such as fried or sweet foods.  Also, avoid spicy foods.  NAUSEA IS COMMON THE FIRST NIGHT OF SURGERY.  IF IT LASTS BEYOND 24 HOURS, CALL YOUR PROVIDER.  NAUSEA MEDICATION WAS GIVEN AT YOUR PREOP APPOINTMENT THAT YOU CAN TAKE AFTER SURGERY. Incision care  Follow instructions from your health care provider about how to take care of your incisions. Make sure you: LEAVE INCISION OPEN AND DRY-NO BANDAGES Leave stitches (sutures), skin glue, or adhesive strips in place UNTIL 2 WEEKS  THEN REMOVE IN THE SHOWER.  If adhesive strip edges start to loosen and curl up, you may trim the loose edges. Check your incision areas every day for signs of infection. Check for: More redness, swelling, or pain. Fluid or blood. Warmth. Pus or a bad smell. Activity  Rest as told by your health care provider. Avoid sitting for a long time without moving. Get up to take short walks every 1-2 hours. This is important to improve blood flow and breathing. Ask for help if you feel weak or unsteady.  If you are sore or tired, rest. Return to your normal activities as told by your health care provider. Ask your health care provider what activities are safe for you.  If you were given a sedative during the procedure, it can affect you for several hours. Do not drive or operate machinery until your health care provider says that it is safe. Lifestyle Do not use any products that contain nicotine or tobacco. These products include cigarettes, chewing tobacco, and vaping devices, such as e-cigarettes. These can delay healing after surgery. If you need help quitting, ask your health care provider. Do not drink alcohol until your health care provider approves. Take a daily multivitamin and keep a high protein diet for wound healing  DO NOT HAVE INTERCOURSE UNTIL YOU ARE INSTRUCTED THAT IT IS SAFE TO DO SO  Post operative appointments need to be scheduled at 2 weeks.  You can come anytime before these with any concerns.    General instructions YOU MAY TAKE SHOWERS ONLY FOR 2 WEEKS AFTER SURGERY, THEN YOU MAY USE TUBS AND HOT TUBS OR  SWIM AFTER THAT Do not douche, use tampons, or have sex for at least 10 weeks, or possibly longer. You will need to have an exam done in the office to be cleared to have intercourse. If you struggle with physical or emotional changes after your procedure, speak with your health care provider or a therapist.  IF YOU HAVE BURNING WITH URINATION, PLEASE CALL YOUR DOCTOR.  BLADDER INFECTIONS MAY OCCUR AFTER SURGERY Try to have someone at home with you for the first week to help with your daily chores.  Wear compression stockings as told by your health care provider. These stockings help to prevent blood clots and reduce swelling in your legs. Keep all follow-up visits. This is important. Contact a health care provider if: You have any of these signs of infection: Chills or a fever 121f OR GREATER. More redness, swelling, or pain around an incision. Fluid or blood coming from an incision. Warmth coming from an incision. Pus or a bad smell coming from an incision. Burning with urination. Urinary frequency or cramping.   IF YOU HAVE THESE SYMPTOMS, PLEASE CALL THE OFFICE TO COME EVALUATE FOR A BLADDER INFECTION AT [phone] An incision opens. You feel dizzy or light-headed. You have pain or bleeding when you urinate, or you are unable to urinate. You have abnormal vaginal discharge. You have pain that does not get better with medicine. Get help right away if: You have a fever and your symptoms suddenly get worse. You have severe abdominal pain. Heavy vaginal bleeding You have chest pain or shortness of breath. You may have chest pain and shortness of breath from the CO2 gas for a few days after surgery.  This is very common.  Walking, Gas-X and motrin will usually help relieve this discomfort You faint. You have pain, swelling, or redness in your leg.    CONSTIPATION MEDICATION AFTER SURGERY: COLACE, MOM, MIRALAX , GAS X are all helpful to have on hand, if needed.  FILL ALL POSTOP MEDICATION BEFORE SURGERY

## 2024-02-09 MED ORDER — GENTAMICIN SULFATE 40 MG/ML IJ SOLN
5.0000 mg/kg | INTRAVENOUS | Status: AC
  Administered 2024-02-10: 350 mg via INTRAVENOUS
  Filled 2024-02-09: qty 8.75

## 2024-02-09 MED ORDER — CLINDAMYCIN PHOSPHATE 900 MG/50ML IV SOLN
900.0000 mg | INTRAVENOUS | Status: AC
  Administered 2024-02-10: 900 mg via INTRAVENOUS
  Filled 2024-02-09: qty 50

## 2024-02-09 NOTE — Anesthesia Preprocedure Evaluation (Signed)
"                                    Anesthesia Evaluation  Patient identified by MRN, date of birth, ID band Patient awake    Reviewed: Allergy  & Precautions, NPO status , Patient's Chart, lab work & pertinent test results  History of Anesthesia Complications (+) Family history of anesthesia reaction and history of anesthetic complications (patient had asthma attack with EGD, mother wakes up with anesthesia)  Airway Mallampati: III  TM Distance: >3 FB Neck ROM: Full   Comment: Previous grade I view with MAC 4, easy mask Dental  (+) Dental Advisory Given   Pulmonary neg shortness of breath, asthma (used inhaler today) , sleep apnea , neg COPD, neg recent URI, Not current smoker, former smoker   Pulmonary exam normal breath sounds clear to auscultation       Cardiovascular (-) hypertension(-) angina (-) Past MI, (-) Cardiac Stents and (-) CABG + dysrhythmias (palpitations)  Rhythm:Regular Rate:Normal     Neuro/Psych  PSYCHIATRIC DISORDERS Anxiety Depression    negative neurological ROS     GI/Hepatic Neg liver ROS,GERD  Medicated,,IBD, IBS   Endo/Other  negative endocrine ROS    Renal/GU negative Renal ROS     Musculoskeletal   Abdominal  (+) + obese  Peds  Hematology  (+) Blood dyscrasia, anemia   Anesthesia Other Findings   Reproductive/Obstetrics                              Anesthesia Physical Anesthesia Plan  ASA: 2  Anesthesia Plan: General   Post-op Pain Management: Tylenol  PO (pre-op)* and Ketamine  IV*   Induction: Intravenous  PONV Risk Score and Plan: 3 and Ondansetron , Dexamethasone , Midazolam , Scopolamine  patch - Pre-op and Treatment may vary due to age or medical condition  Airway Management Planned: Oral ETT  Additional Equipment:   Intra-op Plan:   Post-operative Plan: Extubation in OR  Informed Consent: I have reviewed the patients History and Physical, chart, labs and discussed the procedure  including the risks, benefits and alternatives for the proposed anesthesia with the patient or authorized representative who has indicated his/her understanding and acceptance.     Dental advisory given  Plan Discussed with: CRNA and Anesthesiologist  Anesthesia Plan Comments: (Risks of general anesthesia discussed including, but not limited to, sore throat, hoarse voice, chipped/damaged teeth, injury to vocal cords, nausea and vomiting, allergic reactions, lung infection, heart attack, stroke, and death. All questions answered. )         Anesthesia Quick Evaluation  "

## 2024-02-10 ENCOUNTER — Other Ambulatory Visit: Payer: Self-pay

## 2024-02-10 ENCOUNTER — Encounter: Admission: RE | Disposition: A | Payer: Self-pay | Attending: Obstetrics and Gynecology

## 2024-02-10 ENCOUNTER — Encounter (HOSPITAL_COMMUNITY): Payer: Self-pay | Admitting: Obstetrics and Gynecology

## 2024-02-10 ENCOUNTER — Ambulatory Visit (HOSPITAL_COMMUNITY)
Admission: RE | Admit: 2024-02-10 | Discharge: 2024-02-10 | Disposition: A | Discharge status: Home / Self Care | Attending: Obstetrics and Gynecology | Admitting: Obstetrics and Gynecology

## 2024-02-10 ENCOUNTER — Encounter (HOSPITAL_COMMUNITY): Payer: Self-pay | Admitting: Anesthesiology

## 2024-02-10 ENCOUNTER — Ambulatory Visit (HOSPITAL_COMMUNITY): Payer: Self-pay | Admitting: Anesthesiology

## 2024-02-10 DIAGNOSIS — N946 Dysmenorrhea, unspecified: Secondary | ICD-10-CM | POA: Diagnosis not present

## 2024-02-10 DIAGNOSIS — D259 Leiomyoma of uterus, unspecified: Secondary | ICD-10-CM | POA: Diagnosis not present

## 2024-02-10 DIAGNOSIS — N80122 Deep endometriosis of left ovary: Secondary | ICD-10-CM | POA: Diagnosis not present

## 2024-02-10 DIAGNOSIS — N809 Endometriosis, unspecified: Secondary | ICD-10-CM | POA: Diagnosis not present

## 2024-02-10 DIAGNOSIS — Z30430 Encounter for insertion of intrauterine contraceptive device: Secondary | ICD-10-CM

## 2024-02-10 DIAGNOSIS — J4489 Other specified chronic obstructive pulmonary disease: Secondary | ICD-10-CM | POA: Diagnosis not present

## 2024-02-10 DIAGNOSIS — K668 Other specified disorders of peritoneum: Secondary | ICD-10-CM | POA: Insufficient documentation

## 2024-02-10 DIAGNOSIS — K5090 Crohn's disease, unspecified, without complications: Secondary | ICD-10-CM | POA: Diagnosis not present

## 2024-02-10 DIAGNOSIS — Z842 Family history of other diseases of the genitourinary system: Secondary | ICD-10-CM | POA: Insufficient documentation

## 2024-02-10 DIAGNOSIS — K219 Gastro-esophageal reflux disease without esophagitis: Secondary | ICD-10-CM | POA: Insufficient documentation

## 2024-02-10 DIAGNOSIS — G4730 Sleep apnea, unspecified: Secondary | ICD-10-CM | POA: Insufficient documentation

## 2024-02-10 DIAGNOSIS — R102 Pelvic and perineal pain unspecified side: Secondary | ICD-10-CM | POA: Insufficient documentation

## 2024-02-10 DIAGNOSIS — N80399 Endometriosis of the pelvic peritoneum, other specified sites, unspecified depth: Secondary | ICD-10-CM | POA: Diagnosis not present

## 2024-02-10 DIAGNOSIS — Z87891 Personal history of nicotine dependence: Secondary | ICD-10-CM | POA: Diagnosis not present

## 2024-02-10 DIAGNOSIS — N921 Excessive and frequent menstruation with irregular cycle: Secondary | ICD-10-CM | POA: Diagnosis present

## 2024-02-10 HISTORY — PX: INTRAUTERINE DEVICE (IUD) INSERTION: SHX5877

## 2024-02-10 HISTORY — DX: Endometriosis, unspecified: N80.9

## 2024-02-10 HISTORY — PX: EXCISION, ENDOMETRIOSIS, ROBOTIC ASSISTED, LAPAROSCOPIC: SHX7564

## 2024-02-10 HISTORY — PX: DILATATION & CURRETTAGE/HYSTEROSCOPY WITH RESECTOCOPE: SHX5572

## 2024-02-10 HISTORY — PX: ROBOT ASSISTED MYOMECTOMY: SHX5142

## 2024-02-10 HISTORY — DX: Encounter for insertion of intrauterine contraceptive device: Z30.430

## 2024-02-10 LAB — CBC
HCT: 39.7 % (ref 36.0–46.0)
Hemoglobin: 12.9 g/dL (ref 12.0–15.0)
MCH: 27.5 pg (ref 26.0–34.0)
MCHC: 32.5 g/dL (ref 30.0–36.0)
MCV: 84.6 fL (ref 80.0–100.0)
Platelets: 377 K/uL (ref 150–400)
RBC: 4.69 MIL/uL (ref 3.87–5.11)
RDW: 14.6 % (ref 11.5–15.5)
WBC: 6.5 K/uL (ref 4.0–10.5)
nRBC: 0 % (ref 0.0–0.2)

## 2024-02-10 LAB — TYPE AND SCREEN
ABO/RH(D): O POS
Antibody Screen: NEGATIVE

## 2024-02-10 LAB — POCT PREGNANCY, URINE: Preg Test, Ur: NEGATIVE

## 2024-02-10 LAB — ABO/RH: ABO/RH(D): O POS

## 2024-02-10 SURGERY — DILATATION & CURETTAGE/HYSTEROSCOPY WITH RESECTOCOPE
Anesthesia: General | Site: Uterus

## 2024-02-10 MED ORDER — CHLORHEXIDINE GLUCONATE 0.12 % MT SOLN
15.0000 mL | Freq: Once | OROMUCOSAL | Status: AC
  Administered 2024-02-10: 15 mL via OROMUCOSAL

## 2024-02-10 MED ORDER — KETAMINE HCL 10 MG/ML IJ SOLN
INTRAMUSCULAR | Status: DC | PRN
  Administered 2024-02-10: 30 mg via INTRAVENOUS

## 2024-02-10 MED ORDER — SUGAMMADEX SODIUM 200 MG/2ML IV SOLN
INTRAVENOUS | Status: DC | PRN
  Administered 2024-02-10: 200 mg via INTRAVENOUS

## 2024-02-10 MED ORDER — LEVONORGESTREL 20 MCG/DAY IU IUD
1.0000 | INTRAUTERINE_SYSTEM | INTRAUTERINE | Status: AC
  Administered 2024-02-10: 1 via INTRAUTERINE

## 2024-02-10 MED ORDER — ROCURONIUM BROMIDE 10 MG/ML (PF) SYRINGE
PREFILLED_SYRINGE | INTRAVENOUS | Status: DC | PRN
  Administered 2024-02-10: 60 mg via INTRAVENOUS

## 2024-02-10 MED ORDER — DEXMEDETOMIDINE HCL IN NACL 80 MCG/20ML IV SOLN
INTRAVENOUS | Status: DC | PRN
  Administered 2024-02-10: 12 ug via INTRAVENOUS
  Administered 2024-02-10: 8 ug via INTRAVENOUS

## 2024-02-10 MED ORDER — ROCURONIUM BROMIDE 10 MG/ML (PF) SYRINGE
PREFILLED_SYRINGE | INTRAVENOUS | Status: AC
  Filled 2024-02-10: qty 10

## 2024-02-10 MED ORDER — POVIDONE-IODINE 10 % EX SWAB: 2.0000 | Freq: Once | CUTANEOUS | Status: DC

## 2024-02-10 MED ORDER — CHLORHEXIDINE GLUCONATE 0.12 % MT SOLN
OROMUCOSAL | Status: AC
  Filled 2024-02-10: qty 15

## 2024-02-10 MED ORDER — ONDANSETRON HCL 4 MG/2ML IJ SOLN
INTRAMUSCULAR | Status: AC
  Filled 2024-02-10: qty 2

## 2024-02-10 MED ORDER — 0.9 % SODIUM CHLORIDE (POUR BTL) OPTIME
TOPICAL | Status: DC | PRN
  Administered 2024-02-10: 1000 mL

## 2024-02-10 MED ORDER — OXYCODONE HCL 5 MG PO TABS
ORAL_TABLET | ORAL | Status: AC
  Filled 2024-02-10: qty 1

## 2024-02-10 MED ORDER — PHENYLEPHRINE 80 MCG/ML (10ML) SYRINGE FOR IV PUSH (FOR BLOOD PRESSURE SUPPORT)
PREFILLED_SYRINGE | INTRAVENOUS | Status: DC | PRN
  Administered 2024-02-10: 80 ug via INTRAVENOUS

## 2024-02-10 MED ORDER — DEXAMETHASONE SOD PHOSPHATE PF 10 MG/ML IJ SOLN
INTRAMUSCULAR | Status: DC | PRN
  Administered 2024-02-10: 10 mg via INTRAVENOUS

## 2024-02-10 MED ORDER — LIDOCAINE 2% (20 MG/ML) 5 ML SYRINGE
INTRAMUSCULAR | Status: AC
  Filled 2024-02-10: qty 5

## 2024-02-10 MED ORDER — LEVONORGESTREL 20 MCG/DAY IU IUD
INTRAUTERINE_SYSTEM | INTRAUTERINE | Status: AC
  Filled 2024-02-10: qty 1

## 2024-02-10 MED ORDER — MIDAZOLAM HCL 2 MG/2ML IJ SOLN
INTRAMUSCULAR | Status: AC
  Filled 2024-02-10: qty 2

## 2024-02-10 MED ORDER — AMISULPRIDE (ANTIEMETIC) 5 MG/2ML IV SOLN: 10.0000 mg | Freq: Once | INTRAVENOUS | Status: DC | PRN

## 2024-02-10 MED ORDER — GABAPENTIN 300 MG PO CAPS
300.0000 mg | ORAL_CAPSULE | ORAL | Status: AC
  Administered 2024-02-10: 300 mg via ORAL

## 2024-02-10 MED ORDER — ACETAMINOPHEN 500 MG PO TABS
ORAL_TABLET | ORAL | Status: AC
  Filled 2024-02-10: qty 2

## 2024-02-10 MED ORDER — HYDROMORPHONE HCL 1 MG/ML IJ SOLN
0.2500 mg | INTRAMUSCULAR | Status: DC | PRN
  Administered 2024-02-10 (×2): 0.5 mg via INTRAVENOUS

## 2024-02-10 MED ORDER — ACETAMINOPHEN 500 MG PO TABS
1000.0000 mg | ORAL_TABLET | ORAL | Status: AC
  Administered 2024-02-10: 1000 mg via ORAL

## 2024-02-10 MED ORDER — LACTATED RINGERS IV SOLN: INTRAVENOUS | Status: DC

## 2024-02-10 MED ORDER — KETAMINE HCL 50 MG/5ML IJ SOSY
PREFILLED_SYRINGE | INTRAMUSCULAR | Status: AC
  Filled 2024-02-10: qty 5

## 2024-02-10 MED ORDER — FENTANYL CITRATE (PF) 250 MCG/5ML IJ SOLN
INTRAMUSCULAR | Status: DC | PRN
  Administered 2024-02-10: 50 ug via INTRAVENOUS
  Administered 2024-02-10: 100 ug via INTRAVENOUS
  Administered 2024-02-10: 50 ug via INTRAVENOUS

## 2024-02-10 MED ORDER — PHENYLEPHRINE HCL-NACL 20-0.9 MG/250ML-% IV SOLN
INTRAVENOUS | Status: DC | PRN
  Administered 2024-02-10: 25 ug/min via INTRAVENOUS

## 2024-02-10 MED ORDER — LIDOCAINE 2% (20 MG/ML) 5 ML SYRINGE
INTRAMUSCULAR | Status: DC | PRN
  Administered 2024-02-10: 100 mg via INTRAVENOUS

## 2024-02-10 MED ORDER — SODIUM CHLORIDE 0.9 % IR SOLN
Status: DC | PRN
  Administered 2024-02-10: 3000 mL

## 2024-02-10 MED ORDER — OXYCODONE HCL 5 MG PO TABS
5.0000 mg | ORAL_TABLET | Freq: Once | ORAL | Status: AC | PRN
  Administered 2024-02-10: 5 mg via ORAL

## 2024-02-10 MED ORDER — ONDANSETRON HCL 4 MG/2ML IJ SOLN
INTRAMUSCULAR | Status: DC | PRN
  Administered 2024-02-10: 4 mg via INTRAVENOUS

## 2024-02-10 MED ORDER — MIDAZOLAM HCL (PF) 2 MG/2ML IJ SOLN
INTRAMUSCULAR | Status: DC | PRN
  Administered 2024-02-10: 2 mg via INTRAVENOUS

## 2024-02-10 MED ORDER — FENTANYL CITRATE (PF) 250 MCG/5ML IJ SOLN
INTRAMUSCULAR | Status: AC
  Filled 2024-02-10: qty 5

## 2024-02-10 MED ORDER — GABAPENTIN 300 MG PO CAPS
ORAL_CAPSULE | ORAL | Status: AC
  Filled 2024-02-10: qty 1

## 2024-02-10 MED ORDER — SCOPOLAMINE 1 MG/3DAYS TD PT72
1.0000 | MEDICATED_PATCH | TRANSDERMAL | Status: DC
  Administered 2024-02-10: 1 mg via TRANSDERMAL

## 2024-02-10 MED ORDER — GLYCOPYRROLATE PF 0.2 MG/ML IJ SOSY
PREFILLED_SYRINGE | INTRAMUSCULAR | Status: DC | PRN
  Administered 2024-02-10: .2 mg via INTRAVENOUS

## 2024-02-10 MED ORDER — OXYCODONE HCL 5 MG/5ML PO SOLN: 5.0000 mg | Freq: Once | ORAL | Status: AC | PRN

## 2024-02-10 MED ORDER — ORAL CARE MOUTH RINSE: 15.0000 mL | Freq: Once | OROMUCOSAL | Status: AC

## 2024-02-10 MED ORDER — PROPOFOL 10 MG/ML IV BOLUS
INTRAVENOUS | Status: AC
  Filled 2024-02-10: qty 20

## 2024-02-10 MED ORDER — CLINDAMYCIN PHOSPHATE 900 MG/50ML IV SOLN
INTRAVENOUS | Status: AC
  Filled 2024-02-10: qty 50

## 2024-02-10 MED ORDER — PROPOFOL 10 MG/ML IV BOLUS
INTRAVENOUS | Status: DC | PRN
  Administered 2024-02-10: 50 mg via INTRAVENOUS
  Administered 2024-02-10: 150 mg via INTRAVENOUS

## 2024-02-10 MED ORDER — BUPIVACAINE HCL (PF) 0.5 % IJ SOLN
INTRAMUSCULAR | Status: DC | PRN
  Administered 2024-02-10: 21 mL

## 2024-02-10 MED ORDER — SCOPOLAMINE 1 MG/3DAYS TD PT72
MEDICATED_PATCH | TRANSDERMAL | Status: AC
  Filled 2024-02-10: qty 1

## 2024-02-10 MED ORDER — HYDROMORPHONE HCL 1 MG/ML IJ SOLN
INTRAMUSCULAR | Status: AC
  Filled 2024-02-10: qty 1

## 2024-02-10 MED ORDER — SUGAMMADEX SODIUM 200 MG/2ML IV SOLN
INTRAVENOUS | Status: AC
  Filled 2024-02-10: qty 2

## 2024-02-10 SURGICAL SUPPLY — 60 items
BARRIER ADHS 3X4 INTERCEED (GAUZE/BANDAGES/DRESSINGS) IMPLANT
CATH ROBINSON RED A/P 16FR (CATHETERS) IMPLANT
COVER BACK TABLE 60X90IN (DRAPES) ×4 IMPLANT
COVER MAYO STAND STRL (DRAPES) ×4 IMPLANT
COVER TIP SHEARS 8 DVNC (MISCELLANEOUS) ×4 IMPLANT
DEFOGGER SCOPE WARM SEASHARP (MISCELLANEOUS) ×4 IMPLANT
DERMABOND ADVANCED .7 DNX12 (GAUZE/BANDAGES/DRESSINGS) ×4 IMPLANT
DERMABOND ADVANCED .7 DNX6 (GAUZE/BANDAGES/DRESSINGS) IMPLANT
DEVICE MYOSURE LITE (MISCELLANEOUS) IMPLANT
DEVICE MYOSURE REACH (MISCELLANEOUS) IMPLANT
DILATOR CANAL MILEX (MISCELLANEOUS) IMPLANT
DRAPE ARM DVNC X/XI (DISPOSABLE) ×16 IMPLANT
DRAPE COLUMN DVNC XI (DISPOSABLE) ×4 IMPLANT
DRAPE SURG IRRIG POUCH 19X23 (DRAPES) ×4 IMPLANT
DRAPE UTILITY XL STRL (DRAPES) ×4 IMPLANT
DRIVER NDL MEGA SUTCUT DVNCXI (INSTRUMENTS) ×4 IMPLANT
DRIVER NDLE MEGA SUTCUT DVNCXI (INSTRUMENTS) ×4 IMPLANT
DRSG TELFA 3X8 NADH STRL (GAUZE/BANDAGES/DRESSINGS) IMPLANT
DURAPREP 26ML APPLICATOR (WOUND CARE) ×4 IMPLANT
ELECTRODE REM PT RTRN 9FT ADLT (ELECTROSURGICAL) ×4 IMPLANT
FORCEPS PROGRASP DVNC XI (FORCEP) ×4 IMPLANT
GAUZE 4X4 16PLY ~~LOC~~+RFID DBL (SPONGE) IMPLANT
GLOVE BIOGEL PI IND STRL 7.0 (GLOVE) ×8 IMPLANT
GLOVE NEODERM STER SZ 7 (GLOVE) ×12 IMPLANT
GLOVE SURG SS PI 7.5 STRL IVOR (GLOVE) IMPLANT
GLOVE SURG UNDER POLY LF SZ7 (GLOVE) ×4 IMPLANT
GOWN STRL REUS W/ TWL XL LVL3 (GOWN DISPOSABLE) ×4 IMPLANT
HOLDER FOLEY CATH W/STRAP (MISCELLANEOUS) IMPLANT
IRRIGATION SUCT STRKRFLW 2 WTP (MISCELLANEOUS) ×4 IMPLANT
KIT PINK PAD W/HEAD ARM REST (MISCELLANEOUS) ×4 IMPLANT
KIT PROCED FLUENT PRO FLT212S (KITS) ×4 IMPLANT
KIT TURNOVER KIT B (KITS) ×4 IMPLANT
LEGGING LITHOTOMY PAIR STRL (DRAPES) ×4 IMPLANT
MANIFOLD NEPTUNE II (INSTRUMENTS) ×4 IMPLANT
MIRENA IUD IMPLANT
OBTURATOR OPTICALSTD 8 DVNC (TROCAR) ×4 IMPLANT
PACK ROBOT WH (CUSTOM PROCEDURE TRAY) ×4 IMPLANT
PACK ROBOTIC GOWN (GOWN DISPOSABLE) ×4 IMPLANT
PACK VAGINAL MINOR WOMEN LF (CUSTOM PROCEDURE TRAY) ×4 IMPLANT
PAD OB MATERNITY 11 LF (PERSONAL CARE ITEMS) ×4 IMPLANT
POWDER SURGICEL 3.0 GRAM (HEMOSTASIS) IMPLANT
SCISSORS MNPLR CVD DVNC XI (INSTRUMENTS) ×4 IMPLANT
SCOPETTES 8 STERILE (MISCELLANEOUS) ×4 IMPLANT
SEAL ROD LENS SCOPE MYOSURE (ABLATOR) ×4 IMPLANT
SEAL UNIV 5-12 XI (MISCELLANEOUS) ×12 IMPLANT
SET TUBE SMOKE EVAC HIGH FLOW (TUBING) ×4 IMPLANT
SOLN 0.9% NACL POUR BTL 1000ML (IV SOLUTION) ×4 IMPLANT
SPIKE FLUID TRANSFER (MISCELLANEOUS) ×4 IMPLANT
SUT MNCRL AB 4-0 PS2 18 (SUTURE) ×4 IMPLANT
SYSTEM RETRIEVL 5MM INZII UNIV (BASKET) IMPLANT
TIP ENDOSCOPIC SURGICEL (TIP) IMPLANT
TIP RUMI ORANGE 6.7MMX12CM (TIP) IMPLANT
TIP UTERINE 5.1X6CM LAV DISP (MISCELLANEOUS) IMPLANT
TIP UTERINE 6.7X10CM GRN DISP (MISCELLANEOUS) IMPLANT
TIP UTERINE 6.7X6CM WHT DISP (MISCELLANEOUS) IMPLANT
TIP UTERINE 6.7X8CM BLUE DISP (MISCELLANEOUS) IMPLANT
TOWEL GREEN STERILE (TOWEL DISPOSABLE) ×4 IMPLANT
TOWEL GREEN STERILE FF (TOWEL DISPOSABLE) ×4 IMPLANT
TRAY FOLEY W/BAG SLVR 14FR (SET/KITS/TRAYS/PACK) ×4 IMPLANT
UNDERPAD 30X36 HEAVY ABSORB (UNDERPADS AND DIAPERS) ×4 IMPLANT

## 2024-02-10 NOTE — Op Note (Signed)
 02/10/2024 Marissa Morales [id number]    OPERATIVE REPORT  Preop Diagnosis: Menorrhagia, dysmenorrhea, pelvic pain, likely endometriosis, history of ulcerative colitis  Post operative diagnosis: same  Procedure: Myosure hysteroscopy Dilation and curettage, endocervical curettage, mirena  IUD insertion, robotic excision of endometriosis lesions fulguration of endometriosis lesions on the right ovary, myomectomy of 1cm pedunculated fibroid  Surgeon: Dr. Almarie Rollo Carpen Assistant: Dr. Dallie Circulator: Raguel Sherra CROME, RN Relief Circulator: Starla Duwaine BROCKS, RN; Morales Silvano HERO, RN Scrub Person: [place], Racheal T; [place], [place], CST Vendor Representative : Phebe Pac    Fluids: please see anesthesia report Fluid deficit: 100  Complications: None Anesthesia: General   Findings: Normal appearing cervix and uterine cavity measuring 7cm with both ostia seen.  No submucosal fibroids seen, possible small endometrial polyp seen in thick lining. Stage 1-2 endometriosis with lesions on left side wall and anterior peritoneum and right ovary.  Both fallopian tubes were normal with no blunting of the fimbria.   Normal appendix. No endometriomas or abnormal ovarian cysts seen.  PCO ovaries seen, however.  Estimated blood loss: Minimal  Specimens: Endometrial curettings, ECC,  endometriosis lesions, fibroid  Disposition of specimen: Pathology    Procedure: Patient was taken to the OR where she was placed in dorsal lithotomy in Allen stirrups. SCDs were in place.  The patient was prepped and draped in the usual sterile fashion. An adequate timeout was obtained and everyone agreed. A foley catheter was used to drain her bladder throughout the procedure. A bivalve speculum was placed inside the vagina and the cervix visualized. The cervix was grasped anteriorly with a single-tooth tenaculum.  The uterus sounded to 7 cm. Sequential dilation was done to a #8 dilator, and the myosure  hysteroscope was introduced into the uterine cavity. The cervix and endometrial lining appeared normal both ostia were seen there was no deformity of the cavity. The myosure light was used to sample the entire cavity.    A sharp curettage was gently used to sample the cavity and endocervical curettage was performed and was sent to pathology. The mirena  IUD was inserted and the strings were trimmed to about 2cm below the cervix. A uterine hulka manipulator was then advanced into the uterus to allow movement of the uterus.  Attention was turned to the abdomen where an umbilical incision was made with the scalpel.  The robotic 8mm trocar and sleeve were then advanced without difficulty with the laparoscope under direct visualization into the abdomen.  The abdomen was then insufflated with carbon dioxide gas and adequate pneumoperitoneum was obtained.   Patient was placed in steep trendelenburg position. A detailed survey of the patient's pelvis and abdomen revealed the findings as mentioned above.  Two additional 8mm robotic ports were placed in the right and left lower quadrant under direct visualization.  Two small lesions on the anterior peritoneum were removed.  A large cluster of endometriosis lesions on the left side wall was excised and sent to pathology. The ureter was away from this site. Lesions on the ovaries were fulgurated. The small pedunculated fibroid was removed and placed in an endocatch bag and sent to pathology.    The abdomen was irrigated.     The operative site was surveyed, and it was found to be hemostatic.  No intraoperative injury to surrounding organs was noted.  Arista was used for postoperative hemostasis as well.  The abdomen was desufflated and all instruments were then removed from the patient's abdomen under direct visualization. The uterine manipulator was  removed without complications.  All incisions were closed with subcutaneous suture using 4.0 monocryl and Dermabond. The  patient tolerated the procedures well.  All instruments, needles, and sponge counts were correct x 2. The patient was taken to the recovery room in stable condition.    All instrument, sponge and lap counts were correct x2. The patient was awakened taken to recovery room in stable condition. Almarie MARLA Carpen MD 02/10/2024 12:47 PM

## 2024-02-10 NOTE — Anesthesia Procedure Notes (Signed)
 Procedure Name: Intubation Date/Time: 02/10/2024 10:59 AM  Performed by: Jolynn Mage, CRNAPre-anesthesia Checklist: Patient identified, Patient being monitored, Timeout performed, Emergency Drugs available and Suction available Patient Re-evaluated:Patient Re-evaluated prior to induction Oxygen Delivery Method: Circle System Utilized Preoxygenation: Pre-oxygenation with 100% oxygen Induction Type: IV induction Ventilation: Mask ventilation without difficulty Laryngoscope Size: Miller and 2 Grade View: Grade I Tube type: Oral Tube size: 7.0 mm Number of attempts: 1 Airway Equipment and Method: Stylet Placement Confirmation: ETT inserted through vocal cords under direct vision, positive ETCO2 and breath sounds checked- equal and bilateral Secured at: 21 cm Tube secured with: Tape Dental Injury: Teeth and Oropharynx as per pre-operative assessment

## 2024-02-10 NOTE — Interval H&P Note (Signed)
 History and Physical Interval Note:  02/10/2024 9:47 AM  Marissa Morales  has presented today for surgery, with the diagnosis of dysmenorrhea, endometriosis, menorrhagia with irregular cycle.  The various methods of treatment have been discussed with the patient and family. After consideration of risks, benefits and other options for treatment, the patient has consented to  Procedures with comments: DILATATION & CURETTAGE/HYSTEROSCOPY WITH RESECTOCOPE (N/A) - Myosure and ECC EXCISION, ENDOMETRIOSIS, ROBOTIC ASSISTED, LAPAROSCOPIC (N/A) - robotic operative excision of endometriosis lesions if seen, possible peritoneal stripping in indicated INSERTION, INTRAUTERINE DEVICE (N/A) - Mirena  as a surgical intervention.  The patient's history has been reviewed, patient examined, no change in status, stable for surgery.  I have reviewed the patient's chart and labs.  Questions were answered to the patient's satisfaction.     Marissa Morales

## 2024-02-10 NOTE — Discharge Instructions (Signed)

## 2024-02-10 NOTE — Anesthesia Postprocedure Evaluation (Signed)
"   Anesthesia Post Note  Patient: Marissa Morales  Procedure(s) Performed: DILATATION & CURETTAGE/HYSTEROSCOPY WITH MYOSURE (Vagina ) EXCISION, ENDOMETRIOSIS, ROBOTIC ASSISTED, LAPAROSCOPIC (Abdomen) INSERTION, INTRAUTERINE DEVICE (Uterus) MYOMECTOMY, ROBOT-ASSISTED (Pelvis)     Patient location during evaluation: PACU Anesthesia Type: General Level of consciousness: awake Pain management: pain level controlled Vital Signs Assessment: post-procedure vital signs reviewed and stable Respiratory status: spontaneous breathing, nonlabored ventilation and respiratory function stable Cardiovascular status: blood pressure returned to baseline and stable Postop Assessment: no apparent nausea or vomiting Anesthetic complications: no   No notable events documented.  Last Vitals:  Vitals:   02/10/24 1239 02/10/24 1240  BP: 134/76   Pulse: (!) 107 99  Resp: (!) 22 (!) 21  Temp: 36.8 C   SpO2: 93% 93%    Last Pain:  Vitals:   02/10/24 1402  TempSrc:   PainSc: 6                  Delon Aisha Arch      "

## 2024-02-10 NOTE — Transfer of Care (Signed)
 Immediate Anesthesia Transfer of Care Note  Patient: Marissa Morales  Procedure(s) Performed: DILATATION & CURETTAGE/HYSTEROSCOPY WITH MYOSURE (Vagina ) EXCISION, ENDOMETRIOSIS, ROBOTIC ASSISTED, LAPAROSCOPIC (Abdomen) INSERTION, INTRAUTERINE DEVICE (Uterus) MYOMECTOMY, ROBOT-ASSISTED (Pelvis)  Patient Location: PACU  Anesthesia Type:General  Level of Consciousness: drowsy, patient cooperative, and responds to stimulation  Airway & Oxygen Therapy: Patient Spontanous Breathing and Patient connected to face mask oxygen  Post-op Assessment: Report given to RN, Post -op Vital signs reviewed and stable, and Patient moving all extremities X 4  Post vital signs: Reviewed and stable  Last Vitals:  Vitals Value Taken Time  BP 120/80 02/10/24 12:45  Temp 36.8 C 02/10/24 12:39  Pulse 110 02/10/24 12:46  Resp 20 02/10/24 12:46  SpO2 99 % 02/10/24 12:46  Vitals shown include unfiled device data.  Last Pain:  Vitals:   02/10/24 0754  TempSrc: Oral  PainSc: 4       Patients Stated Pain Goal: 4 (02/10/24 0754)  Complications: No notable events documented.

## 2024-02-11 ENCOUNTER — Encounter (HOSPITAL_COMMUNITY): Payer: Self-pay | Admitting: Obstetrics and Gynecology

## 2024-02-11 ENCOUNTER — Other Ambulatory Visit: Payer: Self-pay | Admitting: Obstetrics and Gynecology

## 2024-02-11 LAB — SURGICAL PATHOLOGY

## 2024-02-11 MED ORDER — OXYCODONE HCL 5 MG PO TABS: 5.0000 mg | ORAL_TABLET | ORAL | 0 refills | Status: DC | PRN

## 2024-02-13 ENCOUNTER — Ambulatory Visit: Payer: Self-pay | Admitting: Obstetrics and Gynecology

## 2024-02-17 ENCOUNTER — Other Ambulatory Visit: Payer: Self-pay

## 2024-02-23 ENCOUNTER — Other Ambulatory Visit (HOSPITAL_COMMUNITY): Payer: Self-pay

## 2024-02-24 ENCOUNTER — Other Ambulatory Visit (HOSPITAL_COMMUNITY): Payer: Self-pay

## 2024-02-24 ENCOUNTER — Other Ambulatory Visit: Payer: Self-pay

## 2024-02-24 ENCOUNTER — Encounter: Payer: Self-pay | Admitting: Gastroenterology

## 2024-02-24 ENCOUNTER — Ambulatory Visit (INDEPENDENT_AMBULATORY_CARE_PROVIDER_SITE_OTHER): Admitting: Urgent Care

## 2024-02-24 ENCOUNTER — Encounter: Payer: Self-pay | Admitting: Obstetrics and Gynecology

## 2024-02-24 VITALS — BP 126/82 | HR 78 | Temp 98.1°F | Ht 61.0 in | Wt 166.0 lb

## 2024-02-24 DIAGNOSIS — J22 Unspecified acute lower respiratory infection: Secondary | ICD-10-CM

## 2024-02-24 DIAGNOSIS — J4541 Moderate persistent asthma with (acute) exacerbation: Secondary | ICD-10-CM

## 2024-02-24 MED ORDER — HYDROCODONE BIT-HOMATROP MBR 5-1.5 MG/5ML PO SOLN: 5.0000 mL | Freq: Every evening | ORAL | 0 refills | Status: DC | PRN

## 2024-02-24 MED ORDER — AMOXICILLIN-POT CLAVULANATE 875-125 MG PO TABS: 1.0000 | ORAL_TABLET | Freq: Two times a day (BID) | ORAL | 0 refills | Status: AC

## 2024-02-24 NOTE — Patient Instructions (Addendum)
 You have a lower respiratory tract infection. Please start taking the antibiotic, Augmentin , twice daily with food. Take it for all 10 days, do not stop early just because you feel better. Take an over the counter probiotic (florastor) and yogurt daily to help prevent diarrhea/ yeast infection.  Use the cough medication at night time only to help you sleep.  It is also recommended that you use nasal saline/ sinus washes to cleans the sinus passages. Hot steam from a shower or vaporizer may also be beneficial to help open up the upper airway. Eucalyptus can be helpful.  Please use your Airsupra  up to 12 puffs daily. Rinse mouth out after use.

## 2024-02-24 NOTE — Progress Notes (Unsigned)
 "  Established Patient Office Visit  Subjective:  Patient ID: Marissa Morales, female    DOB: 10-21-1995  Age: 29 y.o. MRN: [id number]  Chief Complaint  Patient presents with   Cough    X 3 weeks    HPI  Patient Active Problem List   Diagnosis Date Noted   Crohn's disease (HCC) 01/14/2023   Anemia due to blood loss 12/18/2022   Mild obstructive sleep apnea 04/24/2022   Obesity (BMI 30.0-34.9) 04/24/2022   Crohn's disease of both small and large intestine with rectal bleeding (HCC) 10/23/2021   Chronic idiopathic constipation    Anxiety and depression 06/17/2020   Asthma 06/17/2020   Ulcerative colitis (HCC) 06/16/2020   Rectal bleeding 11/25/2019   Emesis 03/16/2013   Nausea 03/16/2013   Constipated 12/15/2012   Dysphagia 09/28/2012   Diarrhea 09/28/2012   Weight loss 09/28/2012   Hematochezia 09/28/2012   History of rectal bleeding 05/29/2011   Abdominal pain 03/01/2011   Past Medical History:  Diagnosis Date   Allergy     cats   Anemia    Anxiety    panic attacks   Asthma    as a child - dirty cats will cause an asthma attack   Complication of anesthesia    pt said she was told she had an asthma attack during an upper GI endoscopy and was given a nebulizer treatment.   Crohn's disease (HCC)    Depression    Encounter for insertion of Mirena  IUD 02/10/2024   Endometriosis    2025 dx by laparosocpy stage 1-2   Family history of adverse reaction to anesthesia    pt states her mother wakes up during anesthesia   GERD (gastroesophageal reflux disease)    Hemorrhoids    History of asthma    childhood   History of kidney stones    IBS (irritable bowel syndrome)    Pilonidal cyst    Sleep apnea    Wears cpap   Wears glasses    Past Surgical History:  Procedure Laterality Date   ANAL RECTAL MANOMETRY N/A 07/24/2020   Procedure: ANO RECTAL MANOMETRY;  Surgeon: Legrand Victory LITTIE DOUGLAS, MD;  Location: [facility];  Service: Gastroenterology;  Laterality: N/A;    BIOPSY  01/15/2023   Procedure: BIOPSY;  Surgeon: Federico Rosario BROCKS, MD;  Location: [facility];  Service: Gastroenterology;;   COLONOSCOPY     COLONOSCOPY WITH ESOPHAGOGASTRODUODENOSCOPY (EGD)  x2  last one 2015   DILATATION & CURRETTAGE/HYSTEROSCOPY WITH RESECTOCOPE N/A 02/10/2024   Procedure: DILATATION & CURETTAGE/HYSTEROSCOPY WITH MYOSURE;  Surgeon: Glennon Almarie POUR, MD;  Location: [facility] OR;  Service: Gynecology;  Laterality: N/A;  Myosure and ECC   EXCISION, ENDOMETRIOSIS, ROBOTIC ASSISTED, LAPAROSCOPIC N/A 02/10/2024   Procedure: EXCISION, ENDOMETRIOSIS, ROBOTIC ASSISTED, LAPAROSCOPIC;  Surgeon: Glennon Almarie POUR, MD;  Location: [facility] OR;  Service: Gynecology;  Laterality: N/A;  robotic operative excision of endometriosis lesions if seen, possible peritoneal stripping in indicated   FLEXIBLE SIGMOIDOSCOPY N/A 01/15/2023   Procedure: FLEXIBLE SIGMOIDOSCOPY;  Surgeon: Federico Rosario BROCKS, MD;  Location: [facility];  Service: Gastroenterology;  Laterality: N/A;   INTRAUTERINE DEVICE (IUD) INSERTION N/A 02/10/2024   Procedure: INSERTION, INTRAUTERINE DEVICE;  Surgeon: Glennon Almarie POUR, MD;  Location: [facility] OR;  Service: Gynecology;  Laterality: N/A;  Mirena    KNEE ARTHROSCOPY Left 03/30/2020   LIPOMA EXCISION N/A 08/28/2022   Procedure: EXCISION OF SUBCUTANEOUS CYST UPPER BACK;  Surgeon: Signe Mitzie LABOR, MD;  Location: [facility];  Service: General;  Laterality:  N/A;   PILONIDAL CYST EXCISION N/A 07/18/2017   Procedure: CYST EXCISION PILONIDAL;  Surgeon: Signe Mitzie LABOR, MD;  Location: [facility];  Service: General;  Laterality: N/A;   ROBOT ASSISTED MYOMECTOMY  02/10/2024   Procedure: MYOMECTOMY, ROBOT-ASSISTED;  Surgeon: Glennon Almarie POUR, MD;  Location: [facility] OR;  Service: Gynecology;;   TONSILLECTOMY Bilateral 06/13/2023   Procedure: PHILLIPS TONSILLECTOMY;  Surgeon: Carlie Clark, MD;  Location: [facility] OR;  Service: ENT;  Laterality: Bilateral;   UPPER GASTROINTESTINAL  ENDOSCOPY     WISDOM TOOTH EXTRACTION  2015   Social History[1]    ROS: as noted in HPI  Objective:     BP 126/82   Pulse 78   Temp 98.1 F (36.7 C)   Ht 5' 1 (1.549 m)   Wt 166 lb (75.3 kg)   LMP 01/06/2024 (Approximate)   SpO2 98%   BMI 31.37 kg/m  BP Readings from Last 3 Encounters:  02/24/24 126/82  02/10/24 (!) 127/102  02/03/24 124/64   Wt Readings from Last 3 Encounters:  02/24/24 166 lb (75.3 kg)  02/10/24 165 lb (74.8 kg)  02/03/24 165 lb (74.8 kg)      Physical Exam   No results found for any visits on 02/24/24.  Last CBC Lab Results  Component Value Date   WBC 6.5 02/10/2024   HGB 12.9 02/10/2024   HCT 39.7 02/10/2024   MCV 84.6 02/10/2024   MCH 27.5 02/10/2024   RDW 14.6 02/10/2024   PLT 377 02/10/2024   Last metabolic panel Lab Results  Component Value Date   GLUCOSE 91 08/12/2023   NA 135 08/12/2023   K 4.1 08/12/2023   CL 105 08/12/2023   CO2 23 08/12/2023   BUN 11 08/12/2023   CREATININE 0.58 08/12/2023   GFR 123.06 08/12/2023   CALCIUM 9.3 08/12/2023   PROT 7.5 08/12/2023   ALBUMIN 4.6 08/12/2023   BILITOT 0.5 08/12/2023   ALKPHOS 73 08/12/2023   AST 10 08/12/2023   ALT 8 08/12/2023   ANIONGAP 10 01/15/2023      The ASCVD Risk score (Arnett DK, et al., 2019) failed to calculate for the following reasons:   The 2019 ASCVD risk score is only valid for ages 76 to 26   * - Cholesterol units were assumed  Assessment & Plan:  There are no diagnoses linked to this encounter.   No follow-ups on file.   Benton LITTIE Gave, PA    [1]  Social History Tobacco Use   Smoking status: Former    Current packs/day: 0.00    Average packs/day: 0.5 packs/day for 5.0 years (2.5 ttl pk-yrs)    Types: Cigarettes, E-cigarettes    Start date: 05/15/2012    Quit date: 05/15/2017    Years since quitting: 6.7   Smokeless tobacco: Never   Tobacco comments:    07-15-2017  per pt quit smoking cig. 05-15-2017 but occasionally vapes  Vaping  Use   Vaping status: Former  Substance Use Topics   Alcohol use: Not Currently   Drug use: No   "

## 2024-02-25 ENCOUNTER — Other Ambulatory Visit: Payer: Self-pay

## 2024-02-25 ENCOUNTER — Telehealth: Payer: Self-pay

## 2024-02-25 ENCOUNTER — Encounter: Payer: Self-pay | Admitting: Obstetrics and Gynecology

## 2024-02-25 ENCOUNTER — Other Ambulatory Visit (HOSPITAL_COMMUNITY): Payer: Self-pay

## 2024-02-25 ENCOUNTER — Encounter: Payer: Self-pay | Admitting: Urgent Care

## 2024-02-25 ENCOUNTER — Ambulatory Visit: Admitting: Obstetrics and Gynecology

## 2024-02-25 VITALS — BP 122/82 | HR 78 | Ht 61.0 in | Wt 165.0 lb

## 2024-02-25 DIAGNOSIS — Z09 Encounter for follow-up examination after completed treatment for conditions other than malignant neoplasm: Secondary | ICD-10-CM

## 2024-02-25 MED ORDER — DROSPIRENONE-ETHINYL ESTRADIOL 3-0.02 MG PO TABS: 1.0000 | ORAL_TABLET | Freq: Every day | ORAL | 0 refills | Status: AC

## 2024-02-25 NOTE — Patient Instructions (Signed)
 Monitor simple sugars, limiting carbohydrate intake to 45 grams a day, avoiding processed foods and getting more protein and fiber in your diet.  The Mediterranean diet provides a great balance. Look for PCO specific cook books to buy.  Try to get 20 min of exercise in 5 days a week. Inositol is a natural supplement that can be taken to help improve cycle regularity and hormone imbalance in PCO.  This can be found at supplement stores, amazon or [employer]. You will have a higher risk for endometrial cancer and diabetes in the future.  Counseled on the mirena  IUD to help control the bleeding and protect the lining from endometrial cancer in the future.  The condition is associated with  periods of anovulation, however, discussed having the diagnosis does not mean you will not need birth control in the future. When not trying to conceive, birth control is important to help stabilize the endometrial lining and prevent endometrial cancer. Improved diet and exercise lowers the insulin resistance and usually improve ovulation rates as well.    Change to yaz ocp's today and see if acne and back hair improve along with reduced pain from new IUD.  Continue that for 3-4 months.

## 2024-02-25 NOTE — Progress Notes (Signed)
 Patient no longer able to fill with [facility] due to insurance restrictions. Medication to be sent to [facility]. New prior authorization required for new year and message sent to provider to inform of need to change pharmacy.

## 2024-02-25 NOTE — Telephone Encounter (Signed)
 Pharmacy Patient Advocate Encounter   Received notification from xxx that prior authorization for Skyrizi  360MG /2.4ML (150MG /ML) single-dose prefilled cartridge with on-body injector is required/requested.   Insurance verification completed.   The patient is insured through [facility].   Per test claim: PA required; PA submitted to above mentioned insurance via Latent Key/confirmation #/EOC [id number] Status is pending   Patient new insurance requires fill at [facility]

## 2024-02-25 NOTE — Progress Notes (Signed)
" ° °  Acute Office Visit  Subjective:    Patient ID: Marissa Morales, female    DOB: 28-Jan-1996, 29 y.o.   MRN: [id number]   HPI 29 y.o. presents today for Post-op Follow-up (2 wk/Pt reports:/Unable to bend over/Feels pulling, sharp &/or dull pain on right side/Severe cramping/) . Pt reports: Unable to bend over Feels pulling, sharp &/or dull pain on right side Severe cramping Pt reports spotting off and on post op. Incision pain on the right   Has chron's unsure if flare No fevers or dysuria Left side some pain. Doing better today. Would like to have restrictions for another week at work but still return.  Patient's last menstrual period was 01/06/2024 (approximate).    Review of Systems     Objective:    OBGyn Exam  BP 122/82 (BP Location: Left Arm, Patient Position: Sitting, Cuff Size: Normal)   Pulse 78   Ht 5' 1 (1.549 m)   Wt 165 lb (74.8 kg)   LMP 01/06/2024 (Approximate) Comment: Pt reports spotting off & on post surgery.Pt reports: Unable to bend over Feels pulling, sharp &/or dull pain on right side Severe cramping  SpO2 98%   BMI 31.18 kg/m  Wt Readings from Last 3 Encounters:  02/25/24 165 lb (74.8 kg)  02/24/24 166 lb (75.3 kg)  02/10/24 165 lb (74.8 kg)    Incisions: I/c/d no erythema   Assessment & Plan:  2 wk PO Rtw with restrictions Begin yaz for 3-4 on continuous basis and stop progesterone only with last normal platelet count See wrap up on pco counseling done today and has [place] criteria for this RTC with annual exams and with any concerns  Dr. Glennon Almarie MARLA Glennon "

## 2024-02-28 ENCOUNTER — Emergency Department (HOSPITAL_BASED_OUTPATIENT_CLINIC_OR_DEPARTMENT_OTHER)

## 2024-02-28 ENCOUNTER — Emergency Department (HOSPITAL_BASED_OUTPATIENT_CLINIC_OR_DEPARTMENT_OTHER)
Admission: EM | Admit: 2024-02-28 | Discharge: 2024-02-28 | Disposition: A | Discharge status: Home / Self Care | Attending: Emergency Medicine | Admitting: Emergency Medicine

## 2024-02-28 ENCOUNTER — Encounter (HOSPITAL_BASED_OUTPATIENT_CLINIC_OR_DEPARTMENT_OTHER): Payer: Self-pay

## 2024-02-28 DIAGNOSIS — R1021 Pelvic and perineal pain right side: Secondary | ICD-10-CM

## 2024-02-28 DIAGNOSIS — N83209 Unspecified ovarian cyst, unspecified side: Secondary | ICD-10-CM | POA: Insufficient documentation

## 2024-02-28 DIAGNOSIS — R1031 Right lower quadrant pain: Secondary | ICD-10-CM | POA: Diagnosis present

## 2024-02-28 LAB — COMPREHENSIVE METABOLIC PANEL WITH GFR
ALT: 7 U/L (ref 0–44)
AST: 23 U/L (ref 15–41)
Albumin: 4.7 g/dL (ref 3.5–5.0)
Alkaline Phosphatase: 91 U/L (ref 38–126)
Anion gap: 15 (ref 5–15)
BUN: 9 mg/dL (ref 6–20)
CO2: 19 mmol/L — ABNORMAL LOW (ref 22–32)
Calcium: 9.5 mg/dL (ref 8.9–10.3)
Chloride: 101 mmol/L (ref 98–111)
Creatinine, Ser: 0.56 mg/dL (ref 0.44–1.00)
GFR, Estimated: >60 mL/min
Glucose, Bld: 142 mg/dL — ABNORMAL HIGH (ref 70–99)
Potassium: 4.6 mmol/L (ref 3.5–5.1)
Sodium: 136 mmol/L (ref 135–145)
Total Bilirubin: 0.3 mg/dL (ref 0.0–1.2)
Total Protein: 7.4 g/dL (ref 6.5–8.1)

## 2024-02-28 LAB — URINALYSIS, ROUTINE W REFLEX MICROSCOPIC
Bilirubin Urine: NEGATIVE
Glucose, UA: NEGATIVE mg/dL
Ketones, ur: NEGATIVE mg/dL
Leukocytes,Ua: NEGATIVE
Nitrite: NEGATIVE
Protein, ur: NEGATIVE mg/dL
Specific Gravity, Urine: 1.015 (ref 1.005–1.030)
pH: 7.5 (ref 5.0–8.0)

## 2024-02-28 LAB — CBC
HCT: 39 % (ref 36.0–46.0)
Hemoglobin: 13 g/dL (ref 12.0–15.0)
MCH: 27.1 pg (ref 26.0–34.0)
MCHC: 33.3 g/dL (ref 30.0–36.0)
MCV: 81.4 fL (ref 80.0–100.0)
Platelets: 407 K/uL — ABNORMAL HIGH (ref 150–400)
RBC: 4.79 MIL/uL (ref 3.87–5.11)
RDW: 14 % (ref 11.5–15.5)
WBC: 11.3 K/uL — ABNORMAL HIGH (ref 4.0–10.5)
nRBC: 0 % (ref 0.0–0.2)

## 2024-02-28 LAB — URINALYSIS, MICROSCOPIC (REFLEX)

## 2024-02-28 LAB — PREGNANCY, URINE: Preg Test, Ur: NEGATIVE

## 2024-02-28 LAB — LIPASE, BLOOD: Lipase: 41 U/L (ref 11–51)

## 2024-02-28 MED ORDER — HYDROMORPHONE HCL 1 MG/ML IJ SOLN
0.5000 mg | Freq: Once | INTRAMUSCULAR | Status: AC
  Administered 2024-02-28: 0.5 mg via INTRAVENOUS
  Filled 2024-02-28: qty 1

## 2024-02-28 MED ORDER — SODIUM CHLORIDE 0.9 % IV BOLUS
1000.0000 mL | Freq: Once | INTRAVENOUS | Status: AC
  Administered 2024-02-28: 1000 mL via INTRAVENOUS

## 2024-02-28 MED ORDER — OXYCODONE-ACETAMINOPHEN 5-325 MG PO TABS: 1.0000 | ORAL_TABLET | Freq: Four times a day (QID) | ORAL | 0 refills | Status: AC | PRN

## 2024-02-28 MED ORDER — IOHEXOL 300 MG/ML  SOLN
100.0000 mL | Freq: Once | INTRAMUSCULAR | Status: AC | PRN
  Administered 2024-02-28: 100 mL via INTRAVENOUS

## 2024-02-28 MED ORDER — HYDROMORPHONE HCL 1 MG/ML IJ SOLN
1.0000 mg | Freq: Once | INTRAMUSCULAR | Status: AC
  Administered 2024-02-28: 1 mg via INTRAVENOUS
  Filled 2024-02-28: qty 1

## 2024-02-28 NOTE — ED Provider Notes (Signed)
 " [facility] Provider Note   CSN: [id number] Arrival date & time: 02/28/24  1435     Patient presents with: Abdominal Pain   Marissa Morales is a 29 y.o. female.   The patient is a 29 year old with Crohn's disease who presents with acute onset severe lower abdominal pain.  She has excruciating lower abdominal pain that started at 1:00 PM today, with shaking, sweating, chills, and nausea. Tylenol  1000 mg did not help. She has also taken Zofran  without relief of nausea.   She had gynecologic surgery on February 10, 2024, for endometriosis and fibroids with IUD placement. She had severe postoperative pain for two weeks that was improving until today. She now has new clear and blood-tinged vaginal discharge that is new. Right after surgery she had spotting that had resolved prior to the onset of today's discharge.  She has Crohn's disease involving both small and large intestine and prior rectal bleeding. She had constipation for 4 to 5 days requiring multiple laxatives, but bowel movements are now normal without recent bleeding or change in bowel habits.  She started Augmentin  on Tuesday for bronchitis after two prior antibiotic courses in the past 30 days. She uses a steroid inhaler for maintenance and has a cough and runny nose. She had a group B strep UTI in early December treated with Augmentin  after Macrobid  failed.  She has increased urinary frequency without dysuria. She feels frequent urge to urinate and has been drinking a lot of fluids. She is worried about infection due to chills and feeling feverish.  The history is provided by the patient.  Abdominal Pain Associated symptoms: chills, cough, nausea and vaginal discharge   Associated symptoms: no chest pain, no diarrhea, no shortness of breath and no vomiting        Prior to Admission medications  Medication Sig Start Date End Date Taking? Authorizing Provider   oxyCODONE -acetaminophen  (PERCOCET/ROXICET) 5-325 MG tablet Take 1 tablet by mouth every 6 (six) hours as needed for severe pain (pain score 7-10). 02/28/24  Yes Cleotilde Perkins, DO  albuterol  (PROVENTIL ) (2.5 MG/3ML) 0.083% nebulizer solution Take 3 mLs (2.5 mg total) by nebulization every 6 (six) hours as needed for wheezing or shortness of breath. 05/23/23   Crain, Whitney L, PA  albuterol  (VENTOLIN  HFA) 108 (90 Base) MCG/ACT inhaler 1 -2 puff as needed for wheezing or shortness of breath Inhalation every 6 hrs; Duration: 30 days    [provider]  Albuterol -Budesonide  (AIRSUPRA ) 90-80 MCG/ACT AERO Inhale 2 puffs into the lungs every 6 (six) hours as needed. 05/01/23   Crain, Whitney L, PA  amoxicillin -clavulanate (AUGMENTIN ) 875-125 MG tablet Take 1 tablet by mouth 2 (two) times daily with a meal. 02/24/24   Crain, Whitney L, PA  Azelastine -Fluticasone  137-50 MCG/ACT SUSP Place 1 spray into the nose daily. 12/25/22   Meade Verdon RAMAN, MD  busPIRone  (BUSPAR ) 10 MG tablet Take 1 tablet (10 mg total) by mouth 2 (two) times daily. 01/29/24   Crain, Whitney L, PA  cetirizine (ZYRTEC) 10 MG tablet Take 10 mg by mouth at bedtime. 11/28/20   [provider]  clindamycin  (CLINDAGEL) 1 % gel Apply topically 2 (two) times daily. 07/28/23   Crain, Whitney L, PA  clobetasol cream (TEMOVATE) 0.05 % Apply 1 Application topically 2 (two) times daily as needed (irritation- affected areas). 08/13/21   [provider]  diclofenac Sodium (VOLTAREN) 1 % GEL Apply 2-3 g topically 4 (four) times daily  as needed (pain). 12/20/20   [provider]  dicyclomine  (BENTYL ) 10 MG capsule TAKE 1 CAPSULE 2-3 TIMES A DAY AS NEEDED FOR ABDOMINAL CRAMPS 01/21/22   Legrand Victory LITTIE DOUGLAS, MD  drospirenone -ethinyl estradiol  (YAZ) 3-0.02 MG tablet Take 1 tablet by mouth daily. Stop progesterone only birth control and start this for 2-3 months to allow mirena  IUD to settle and reduce bleeding and pain 02/25/24   Glennon Almarie POUR, MD  fluticasone -salmeterol (ADVAIR) 250-50 MCG/ACT AEPB Inhale 1 puff into the lungs in the morning and at bedtime. 05/17/23   [provider]  hydrocortisone  cream 1 % Apply to affected area 2 times daily 03/24/21   Bernis Ernst, PA-C  hydrOXYzine (ATARAX) 25 MG tablet Take 25 mg by mouth daily as needed for anxiety (or panic attacks). 02/19/22   [provider]  lubiprostone  (AMITIZA ) 8 MCG capsule TAKE 1 CAPSULE (8 MCG TOTAL) BY MOUTH 2 (TWO) TIMES DAILY WITH A MEAL. 01/28/24   Legrand Victory LITTIE DOUGLAS, MD  mesalamine  (CANASA ) 1000 MG suppository PLACE 1 SUPPOSITORY (1,000 MG TOTAL) RECTALLY AT BEDTIME. Patient not taking: Reported on 02/24/2024 12/01/23   Legrand Victory LITTIE DOUGLAS, MD  metoCLOPramide  (REGLAN ) 10 MG tablet Take 1 tablet (10 mg total) by mouth every 8 (eight) hours as needed. Patient not taking: Reported on 02/24/2024 02/03/24   Glennon Almarie POUR, MD  nitrofurantoin , macrocrystal-monohydrate, (MACROBID ) 100 MG capsule Take 1 capsule (100 mg total) by mouth 2 (two) times daily. 01/21/24   Glennon Almarie POUR, MD  omeprazole  (PRILOSEC) 40 MG capsule Take 1 capsule (40 mg total) by mouth daily. 11/25/23   Legrand Victory LITTIE DOUGLAS, MD  ondansetron  (ZOFRAN ) 4 MG tablet TAKE 1 TABLET BY MOUTH EVERY 8 HOURS AS NEEDED FOR NAUSEA AND VOMITING 02/25/22   [provider]  polyethylene glycol (MIRALAX ) 17 g packet Take 17 g by mouth daily as needed for mild constipation. 01/15/23   Patel, Pranav M, MD  Polyethylene Glycol 400 (BLINK TEARS) 0.25 % SOLN Place 1 drop into both eyes daily as needed (dry eye).    [provider]  promethazine  (PHENERGAN ) 25 MG tablet Take 1 tablet (25 mg total) by mouth every 6 (six) hours as needed for nausea or vomiting. 08/12/23   Beather Delon Gibson, PA  Risankizumab -rzaa (SKYRIZI ) 360 MG/2.4ML SOCT 360 mg OBI into skin every 8 weeks 08/28/23   Legrand Victory LITTIE DOUGLAS, MD  sertraline  (ZOLOFT ) 50 MG tablet Take 50 mg by mouth in the morning.  03/01/20   [provider]  spironolactone  (ALDACTONE ) 25 MG tablet Take 1 tablet (25 mg total) by mouth 2 (two) times daily. 07/28/23   Crain, Whitney L, PA  traZODone  (DESYREL ) 50 MG tablet Take 1 tablet (50 mg total) by mouth at bedtime. 08/13/23   Crain, Whitney L, PA    Allergies: Wound dressing adhesive, Silicone, Vancomycin , Bactrim [sulfamethoxazole-trimethoprim], Bismuth subsalicylate, Bismuth-containing compounds, Cefpodoxime, Doxycycline, and Ibuprofen    Review of Systems  Constitutional:  Positive for chills and diaphoresis.  Respiratory:  Positive for cough. Negative for shortness of breath and wheezing.   Cardiovascular:  Negative for chest pain.  Gastrointestinal:  Positive for abdominal pain and nausea. Negative for blood in stool, diarrhea and vomiting.  Genitourinary:  Positive for difficulty urinating, frequency, pelvic pain, urgency and vaginal discharge.    Updated Vital Signs BP 113/72   Pulse 73   Temp 99.1 F (37.3 C)   Resp 18   Ht 5' 1 (1.549 m)  Wt 74.8 kg   LMP 01/09/2024 (Approximate) Comment: Pt reports spotting off & on post surgery.Pt reports: Unable to bend over Feels pulling, sharp &/or dull pain on right side Severe cramping  SpO2 98%   BMI 31.18 kg/m   Physical Exam Constitutional:      Appearance: She is ill-appearing.  Cardiovascular:     Rate and Rhythm: Normal rate and regular rhythm.  Pulmonary:     Effort: Pulmonary effort is normal.     Breath sounds: Normal breath sounds. No stridor. No wheezing.  Abdominal:     General: Abdomen is flat. A surgical scar is present. Bowel sounds are normal.     Palpations: Abdomen is soft.     Tenderness: There is abdominal tenderness in the right lower quadrant. There is guarding. There is no rebound.     Comments: Multiple robotic surgical sites healing appropriately, no discharge, or erythema noted.   Skin:    General: Skin is warm.     Capillary Refill: Capillary refill takes less than  2 seconds.  Neurological:     General: No focal deficit present.     Mental Status: She is alert and oriented to person, place, and time.     (all labs ordered are listed, but only abnormal results are displayed) Labs Reviewed  COMPREHENSIVE METABOLIC PANEL WITH GFR - Abnormal; Notable for the following components:      Result Value   CO2 19 (*)    Glucose, Bld 142 (*)    All other components within normal limits  CBC - Abnormal; Notable for the following components:   WBC 11.3 (*)    Platelets 407 (*)    All other components within normal limits  URINALYSIS, ROUTINE W REFLEX MICROSCOPIC - Abnormal; Notable for the following components:   Hgb urine dipstick MODERATE (*)    All other components within normal limits  URINALYSIS, MICROSCOPIC (REFLEX) - Abnormal; Notable for the following components:   Bacteria, UA FEW (*)    All other components within normal limits  LIPASE, BLOOD  PREGNANCY, URINE    EKG: EKG Interpretation Date/Time:  Saturday February 28 2024 15:54:09 EST Ventricular Rate:  71 PR Interval:  150 QRS Duration:  95 QT Interval:  397 QTC Calculation: 432 R Axis:   8  Text Interpretation: Sinus arrhythmia Nonspecific T abnormalities, anterior leads No previous ECGs available Confirmed by Patt Alm DEL ([id number]) on 02/28/2024 4:49:11 PM  Radiology: US  PELVIC COMPLETE W TRANSVAGINAL AND TORSION R/O Result Date: 02/28/2024 CLINICAL DATA:  Acute pelvic pain. EXAM: TRANSABDOMINAL AND TRANSVAGINAL ULTRASOUND OF PELVIS DOPPLER ULTRASOUND OF OVARIES TECHNIQUE: Both transabdominal and transvaginal ultrasound examinations of the pelvis were performed. Transabdominal technique was performed for global imaging of the pelvis including uterus, ovaries, adnexal regions, and pelvic cul-de-sac. It was necessary to proceed with endovaginal exam following the transabdominal exam to visualize the uterus, endometrium, ovaries and adnexal regions. Color and duplex Doppler ultrasound was  utilized to evaluate blood flow to the ovaries. COMPARISON:  12/08/2023. FINDINGS: Uterus Measurements: 8.6 x 3.7 x 5.1 cm = volume: 86 mL. No fibroids or other mass visualized. Intrauterine contraceptive device in proper position. Endometrium Thickness: 10 mm, within normal limits for age. No focal abnormality visualized. Right ovary Measurements: 3.1 x 2.5 x 2.1 cm = volume:  8.6 mL. Normal appearance/no adnexal mass. Doppler: There is normal vascularity on color doppler examination. Spectral doppler arterial and venous waveforms are normal. Left ovary Measurements: 3.4 x 2.5 x 2.5 cm =  volume: 10.9 mL. Minimally complex anechoic lesion with increased through transmission in the left ovary measures 2.1 x 1.5 x 1.3 cm. Doppler: There is normal vascularity on color doppler examination. Spectral doppler arterial and venous waveforms are normal. Other findings No abnormal free fluid. IMPRESSION: 1. Possible small hemorrhagic follicle in the left ovary. If follow-up to resolution is desired, ultrasound in 6-12 weeks is recommended. 2. Otherwise, no acute findings. 3. Intrauterine contraceptive device in place. Electronically Signed   By: Newell Eke M.D.   On: 02/28/2024 17:05   CT ABDOMEN PELVIS W CONTRAST Result Date: 02/28/2024 CLINICAL DATA:  Postoperative abdominal pain, history of endometriosis, exploratory laparotomy and D and C procedure 18 days ago EXAM: CT ABDOMEN AND PELVIS WITH CONTRAST TECHNIQUE: Multidetector CT imaging of the abdomen and pelvis was performed using the standard protocol following bolus administration of intravenous contrast. RADIATION DOSE REDUCTION: This exam was performed according to the departmental dose-optimization program which includes automated exposure control, adjustment of the mA and/or kV according to patient size and/or use of iterative reconstruction technique. CONTRAST:  OMNIPAQUE  IOHEXOL  300 MG/ML  SOLN COMPARISON:  12/16/2023, 01/14/2023 FINDINGS: Lower  chest: No acute pleural or parenchymal lung disease. Hepatobiliary: No focal liver abnormality is seen. No gallstones, gallbladder wall thickening, or biliary dilatation. Pancreas: Unremarkable. No pancreatic ductal dilatation or surrounding inflammatory changes. Spleen: Normal in size without focal abnormality. Adrenals/Urinary Tract: Adrenal glands are unremarkable. Kidneys are normal, without renal calculi, focal lesion, or hydronephrosis. Bladder is unremarkable. Stomach/Bowel: No bowel obstruction or ileus. Normal retrocecal appendix. No bowel wall thickening or inflammatory change. Vascular/Lymphatic: Multiple subcentimeter mesenteric and retroperitoneal lymph nodes are stable, nonspecific. No pathologic adenopathy. No significant vascular findings. Reproductive: IUD within uterus, within the expected position within the endometrial cavity. Right ovary is unremarkable. Ruptured follicle within the left ovary measuring 2.0 cm. Other: No free intraperitoneal fluid. There is a punctate focus of intraperitoneal gas within the central upper abdomen image 24/2, likely residual from recent operative procedure 02/10/2024. No abdominal wall hernia. Musculoskeletal: No acute or destructive bony abnormalities. Reconstructed images demonstrate no additional findings. IMPRESSION: 1. Likely ruptured 2 cm follicle within the left ovary. Please correlate with site of patient's pain. If further evaluation is desired, ultrasound could be considered. 2. Otherwise no acute intra-abdominal or intrapelvic process. 3. Punctate focus of gas within the central upper abdomen, likely residual after abdominal surgery 02/10/2024. 4. Stable nonspecific subcentimeter mesenteric and retroperitoneal lymph nodes. Electronically Signed   By: Ozell Daring M.D.   On: 02/28/2024 17:02     Procedures   Medications Ordered in the ED  HYDROmorphone  (DILAUDID ) injection 0.5 mg (0.5 mg Intravenous Given 02/28/24 1521)  sodium chloride  0.9 %  bolus 1,000 mL (0 mLs Intravenous Stopped 02/28/24 1750)  iohexol  (OMNIPAQUE ) 300 MG/ML solution 100 mL (100 mLs Intravenous Contrast Given 02/28/24 1644)  HYDROmorphone  (DILAUDID ) injection 1 mg (1 mg Intravenous Given 02/28/24 1713)                                    Medical Decision Making Marissa Morales is a 29 y.o. female presenting for acute onset right lower quadrant pelvic pain. On presentation vital signs significant for BP 146/99. On exam she is ill-appearing with pain localized the right lower quadrant. Differential includes ovarian torsion, SBO, post-op infection, complication from Crohn's disease, appendicitis, nephrolithiasis. Lab work significant for leukocytosis, negative pregnancy test and negative UA  for infection. She received 0.5 mg dilaudid  for pain. EKG obtained and showed NSR without prolonged Qtc due to recent antiemetic use.   Pelvic ultrasound negative for torsion but did show hemorrhagic cyst on the left side.  CT AP showed a ruptured ovarian cyst on the left side and free air consistent with recent surgery, no other acute intraabdominal findings. On reevaluation pain has improved with dilaudid  and patient feels comfortable discharging home.  Plan to prescribe 2-3 days of Percocet for pain management with outpatient OB/GYN follow up.    Amount and/or Complexity of Data Reviewed Labs: ordered. Radiology: ordered.  Risk Prescription drug management.       Final diagnoses:  Right-sided pelvic pain  Ruptured ovarian cyst    ED Discharge Orders          Ordered    oxyCODONE -acetaminophen  (PERCOCET/ROXICET) 5-325 MG tablet  Every 6 hours PRN        02/28/24 1742               Cleotilde Perkins, DO 02/28/24 1802    Patt Alm Macho, MD 02/28/24 2255  "

## 2024-02-28 NOTE — ED Triage Notes (Signed)
 Pt is 18 days post-op from ex lap and d&c. Has endometriosis, removed some uterine lining and a polyp. Pt in severe pain in triage, pain onset 1315. Pt took 1000 mg Tylenol .

## 2024-03-01 MED ORDER — SKYRIZI 360 MG/2.4ML ~~LOC~~ SOCT: SUBCUTANEOUS | 6 refills | Status: AC

## 2024-03-01 NOTE — Telephone Encounter (Signed)
 Pharmacy Patient Advocate Encounter  Received notification from [facility] that Prior Authorization for Skyrizi  360MG /2.4ML (150MG /ML) single-dose prefilled cartridge with on-body injector has been APPROVED from 02-28-2024 to 02-27-2025   PA #/Case ID/Reference #: [id number]   Must fill at [facility]

## 2024-03-01 NOTE — Telephone Encounter (Signed)
 Noted pt. advised

## 2024-03-03 ENCOUNTER — Other Ambulatory Visit (HOSPITAL_COMMUNITY): Payer: Self-pay

## 2024-03-10 ENCOUNTER — Other Ambulatory Visit: Payer: Self-pay | Admitting: Urgent Care

## 2024-03-10 DIAGNOSIS — L700 Acne vulgaris: Secondary | ICD-10-CM

## 2024-03-17 ENCOUNTER — Encounter: Payer: Self-pay | Admitting: Urgent Care

## 2024-03-18 MED ORDER — SERTRALINE HCL 50 MG PO TABS: 50.0000 mg | ORAL_TABLET | Freq: Every morning | ORAL | 1 refills | Status: AC

## 2024-03-22 ENCOUNTER — Other Ambulatory Visit: Payer: Self-pay | Admitting: Urgent Care

## 2024-03-22 DIAGNOSIS — F419 Anxiety disorder, unspecified: Secondary | ICD-10-CM
